# Patient Record
Sex: Male | Born: 1945 | Race: White | Hispanic: No | Smoking: Former smoker
Health system: Southern US, Community
[De-identification: ages and names within clinical notes are randomized; demographics above are authoritative.]

## PROBLEM LIST (undated history)

## (undated) DIAGNOSIS — M659 Unspecified synovitis and tenosynovitis, unspecified site: Secondary | ICD-10-CM

## (undated) DIAGNOSIS — I499 Cardiac arrhythmia, unspecified: Secondary | ICD-10-CM

## (undated) DIAGNOSIS — R0602 Shortness of breath: Secondary | ICD-10-CM

## (undated) DIAGNOSIS — R42 Dizziness and giddiness: Secondary | ICD-10-CM

## (undated) DIAGNOSIS — D649 Anemia, unspecified: Secondary | ICD-10-CM

## (undated) DIAGNOSIS — G4730 Sleep apnea, unspecified: Secondary | ICD-10-CM

## (undated) DIAGNOSIS — M542 Cervicalgia: Secondary | ICD-10-CM

## (undated) DIAGNOSIS — I10 Essential (primary) hypertension: Secondary | ICD-10-CM

## (undated) DIAGNOSIS — N182 Chronic kidney disease, stage 2 (mild): Secondary | ICD-10-CM

## (undated) DIAGNOSIS — K253 Acute gastric ulcer without hemorrhage or perforation: Secondary | ICD-10-CM

## (undated) DIAGNOSIS — M1990 Unspecified osteoarthritis, unspecified site: Secondary | ICD-10-CM

## (undated) DIAGNOSIS — R51 Headache: Secondary | ICD-10-CM

## (undated) HISTORY — DX: Chronic kidney disease, stage 2 (mild): N18.2

## (undated) HISTORY — DX: Anemia, unspecified: D64.9

## (undated) HISTORY — DX: Unspecified synovitis and tenosynovitis, unspecified site: M65.90

## (undated) HISTORY — DX: Synovitis and tenosynovitis, unspecified: M65.9

## (undated) HISTORY — PX: CARDIAC CATHETERIZATION: SHX172

## (undated) HISTORY — PX: OTHER SURGICAL HISTORY: SHX169

## (undated) HISTORY — PX: COLONOSCOPY: SHX174

---

## 1990-02-27 HISTORY — PX: OTHER SURGICAL HISTORY: SHX169

## 1997-05-28 ENCOUNTER — Ambulatory Visit (HOSPITAL_COMMUNITY): Admission: RE | Admit: 1997-05-28 | Discharge: 1997-05-28 | Payer: Self-pay | Admitting: Cardiology

## 1998-10-02 ENCOUNTER — Ambulatory Visit: Admission: RE | Admit: 1998-10-02 | Discharge: 1998-10-02 | Payer: Self-pay | Admitting: Orthopedic Surgery

## 1998-10-02 ENCOUNTER — Encounter: Payer: Self-pay | Admitting: Orthopedic Surgery

## 1999-02-28 HISTORY — PX: KNEE ARTHROSCOPY: SUR90

## 1999-12-19 ENCOUNTER — Encounter: Payer: Self-pay | Admitting: Physical Medicine and Rehabilitation

## 1999-12-19 ENCOUNTER — Encounter
Admission: RE | Admit: 1999-12-19 | Discharge: 1999-12-19 | Payer: Self-pay | Admitting: Physical Medicine and Rehabilitation

## 2001-12-23 ENCOUNTER — Encounter: Payer: Self-pay | Admitting: Family Medicine

## 2001-12-23 ENCOUNTER — Ambulatory Visit (HOSPITAL_COMMUNITY): Admission: RE | Admit: 2001-12-23 | Discharge: 2001-12-23 | Payer: Self-pay | Admitting: Family Medicine

## 2004-12-05 ENCOUNTER — Ambulatory Visit (HOSPITAL_COMMUNITY): Admission: RE | Admit: 2004-12-05 | Discharge: 2004-12-05 | Payer: Self-pay | Admitting: General Surgery

## 2005-02-21 ENCOUNTER — Ambulatory Visit (HOSPITAL_COMMUNITY): Admission: RE | Admit: 2005-02-21 | Discharge: 2005-02-21 | Payer: Self-pay | Admitting: *Deleted

## 2005-02-27 HISTORY — PX: OTHER SURGICAL HISTORY: SHX169

## 2005-04-18 ENCOUNTER — Encounter
Admission: RE | Admit: 2005-04-18 | Discharge: 2005-04-18 | Payer: Self-pay | Admitting: Thoracic Surgery (Cardiothoracic Vascular Surgery)

## 2005-04-19 ENCOUNTER — Ambulatory Visit (HOSPITAL_COMMUNITY): Admission: RE | Admit: 2005-04-19 | Discharge: 2005-04-19 | Payer: Self-pay | Admitting: Family Medicine

## 2005-05-05 ENCOUNTER — Inpatient Hospital Stay (HOSPITAL_COMMUNITY)
Admission: RE | Admit: 2005-05-05 | Discharge: 2005-05-10 | Payer: Self-pay | Admitting: Thoracic Surgery (Cardiothoracic Vascular Surgery)

## 2005-05-05 ENCOUNTER — Encounter (INDEPENDENT_AMBULATORY_CARE_PROVIDER_SITE_OTHER): Payer: Self-pay | Admitting: *Deleted

## 2005-06-19 ENCOUNTER — Encounter
Admission: RE | Admit: 2005-06-19 | Discharge: 2005-06-19 | Payer: Self-pay | Admitting: Thoracic Surgery (Cardiothoracic Vascular Surgery)

## 2005-12-18 ENCOUNTER — Encounter (INDEPENDENT_AMBULATORY_CARE_PROVIDER_SITE_OTHER): Payer: Self-pay | Admitting: Specialist

## 2005-12-18 ENCOUNTER — Other Ambulatory Visit: Admission: RE | Admit: 2005-12-18 | Discharge: 2005-12-18 | Payer: Self-pay | Admitting: Family Medicine

## 2005-12-21 ENCOUNTER — Ambulatory Visit (HOSPITAL_COMMUNITY): Admission: RE | Admit: 2005-12-21 | Discharge: 2005-12-21 | Payer: Self-pay | Admitting: Family Medicine

## 2006-10-01 ENCOUNTER — Ambulatory Visit (HOSPITAL_COMMUNITY): Admission: RE | Admit: 2006-10-01 | Discharge: 2006-10-01 | Payer: Self-pay | Admitting: Family Medicine

## 2006-10-02 ENCOUNTER — Ambulatory Visit (HOSPITAL_COMMUNITY): Admission: RE | Admit: 2006-10-02 | Discharge: 2006-10-02 | Payer: Self-pay | Admitting: Family Medicine

## 2007-02-28 HISTORY — PX: SHOULDER SURGERY: SHX246

## 2007-08-31 ENCOUNTER — Ambulatory Visit: Payer: Self-pay | Admitting: Internal Medicine

## 2007-08-31 ENCOUNTER — Inpatient Hospital Stay (HOSPITAL_COMMUNITY): Admission: EM | Admit: 2007-08-31 | Discharge: 2007-09-01 | Payer: Self-pay | Admitting: Emergency Medicine

## 2007-08-31 ENCOUNTER — Ambulatory Visit: Payer: Self-pay | Admitting: Cardiology

## 2007-08-31 ENCOUNTER — Encounter (INDEPENDENT_AMBULATORY_CARE_PROVIDER_SITE_OTHER): Payer: Self-pay | Admitting: Internal Medicine

## 2007-09-18 ENCOUNTER — Ambulatory Visit: Payer: Self-pay

## 2007-09-19 ENCOUNTER — Ambulatory Visit: Payer: Self-pay

## 2007-09-19 ENCOUNTER — Encounter: Payer: Self-pay | Admitting: Cardiology

## 2007-09-25 ENCOUNTER — Observation Stay (HOSPITAL_COMMUNITY): Admission: RE | Admit: 2007-09-25 | Discharge: 2007-09-27 | Payer: Self-pay | Admitting: Orthopedic Surgery

## 2007-11-25 ENCOUNTER — Encounter: Admission: RE | Admit: 2007-11-25 | Discharge: 2007-11-25 | Payer: Self-pay | Admitting: Orthopedic Surgery

## 2008-01-24 ENCOUNTER — Ambulatory Visit (HOSPITAL_COMMUNITY): Admission: RE | Admit: 2008-01-24 | Discharge: 2008-01-24 | Payer: Self-pay | Admitting: Family Medicine

## 2008-03-01 IMAGING — CR DG CHEST 1V PORT
1 series · 1 of 1 positions shown · non-contrast
Comparison: 05/05/05.

CLINICAL DATA: Post-op VATS.  Mediastinal mass.
 PORTABLE CHEST - 1 VIEW - 05/06/05 AT 7176 HOURS:

[view not recorded]
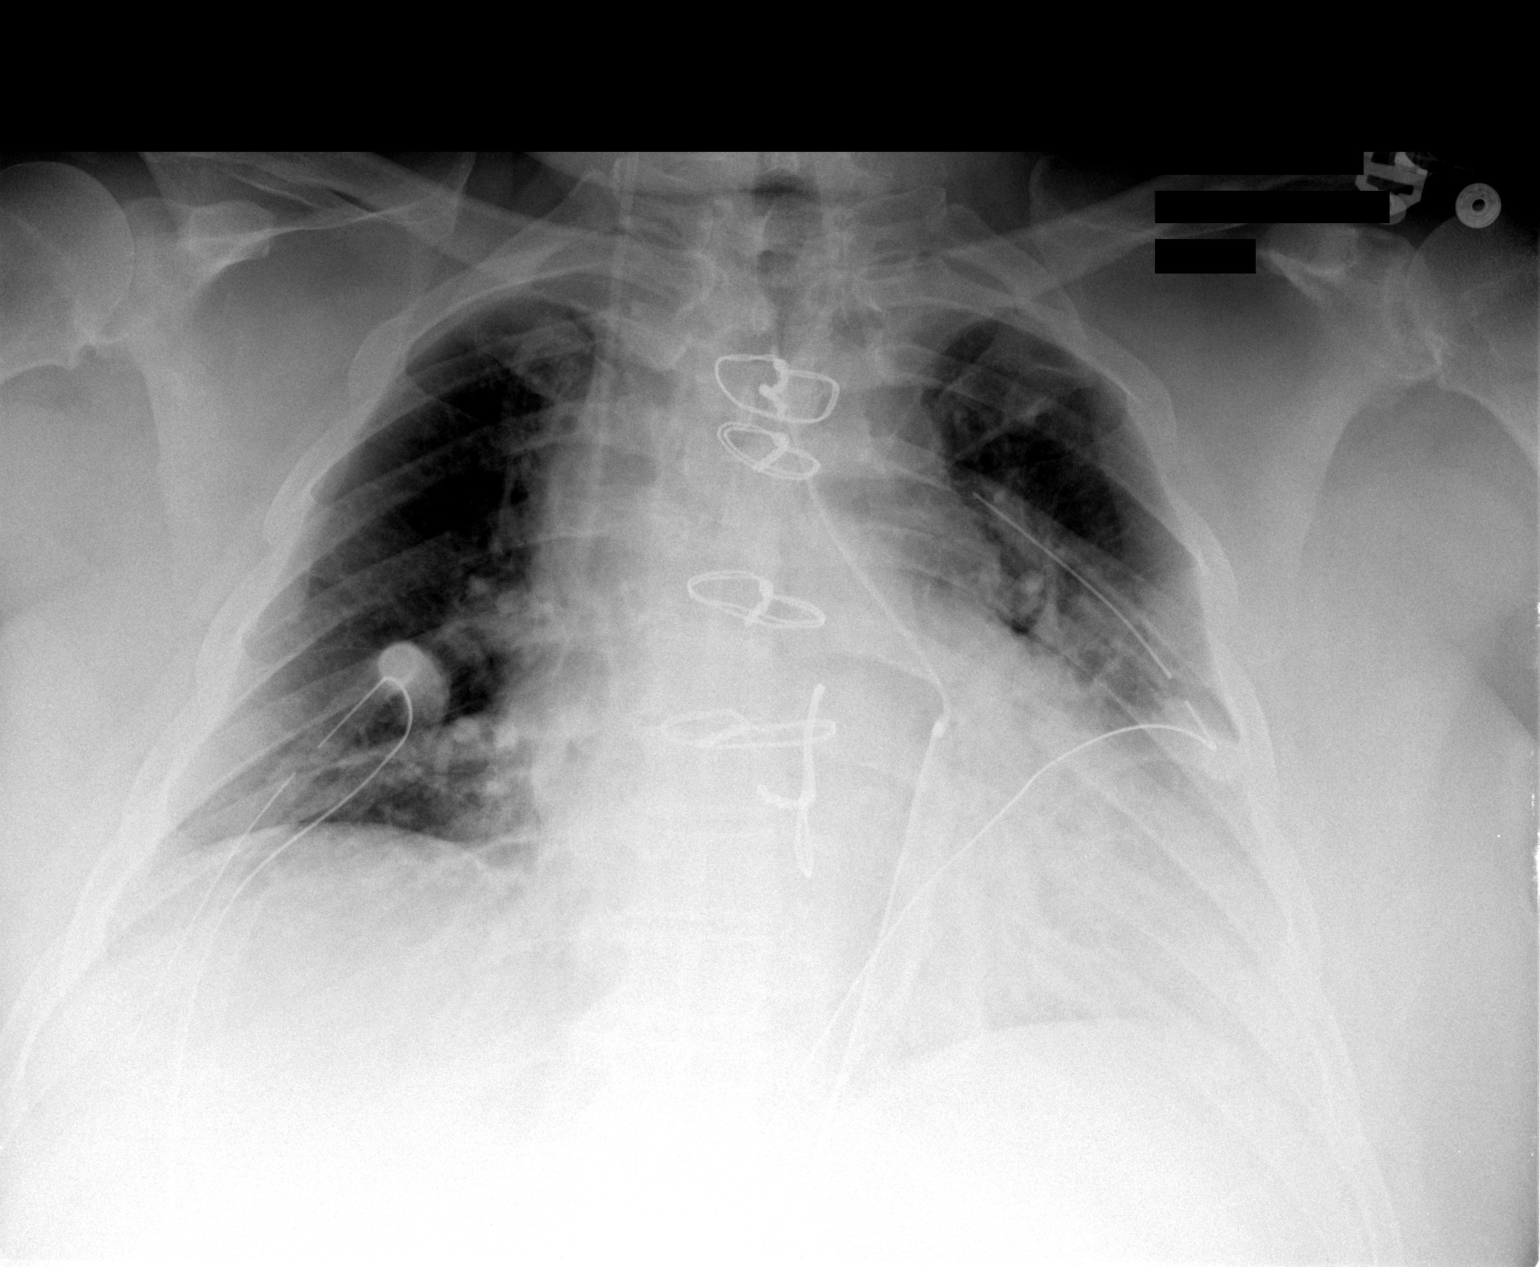

[1 of 1 positions shown; findings below may reference images not displayed]

FINDINGS: The right IJ central venous catheter and bilateral chest tubes are in stable position.  The cardiomediastinal contours are unchanged status post median sternotomy and probable CABG.  Overall pulmonary aeration has improved.  There is no pneumothorax.
IMPRESSION: Improving pulmonary aeration.  No pneumothorax.

## 2008-03-02 IMAGING — CR DG CHEST 1V PORT
1 series · 1 of 1 positions shown · non-contrast
Comparison: 05/06/05.

CLINICAL DATA: Postop VATS for mediastinal mass. 
 PORTABLE CHEST ([DATE]  HOURS):

[view not recorded]
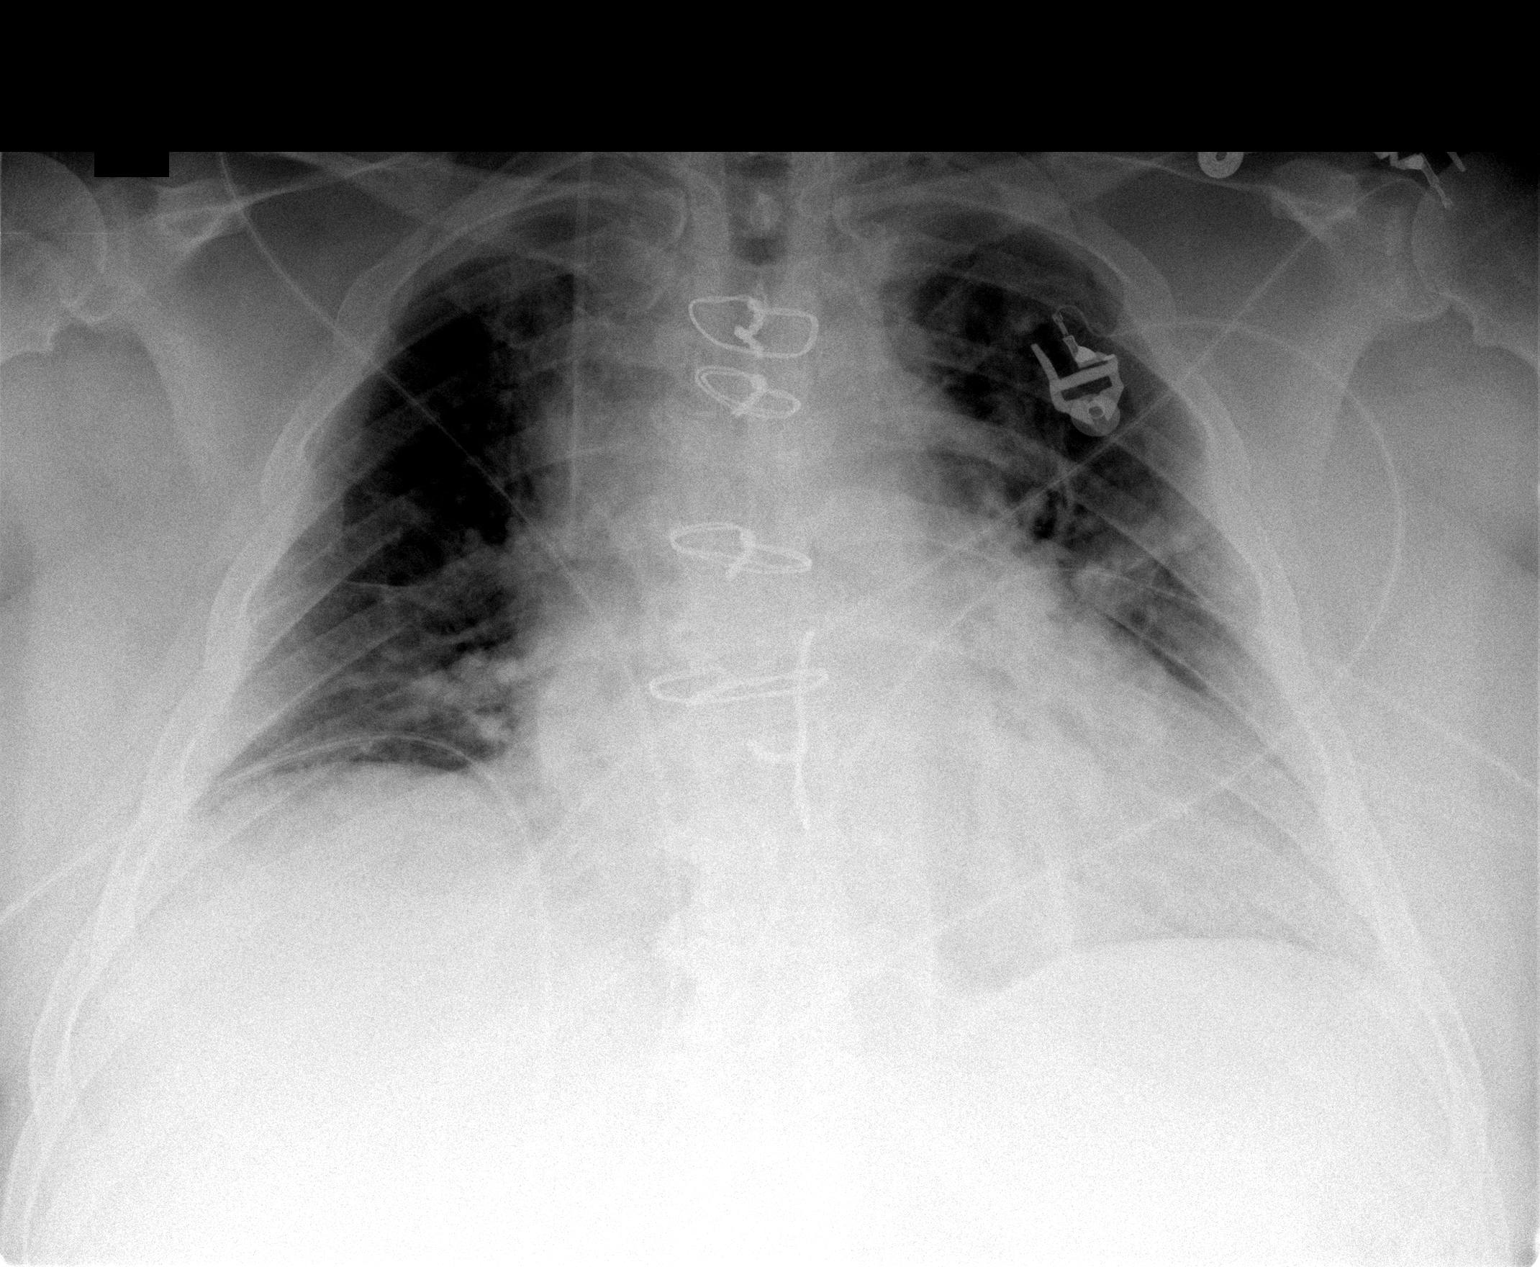

[1 of 1 positions shown; findings below may reference images not displayed]

FINDINGS: The chest tubes have been removed.  Central line remains in place.  The cardiomediastinal contours appear stable following median sternotomy.  There is mild perihilar atelectasis bilaterally.  No pneumothorax or significant pleural effusion is seen.
IMPRESSION: No pneumothorax or other significant change following chest tube removal.

## 2009-09-15 ENCOUNTER — Ambulatory Visit (HOSPITAL_COMMUNITY): Admission: RE | Admit: 2009-09-15 | Discharge: 2009-09-15 | Payer: Self-pay | Admitting: Family Medicine

## 2009-09-17 ENCOUNTER — Emergency Department (HOSPITAL_COMMUNITY): Admission: EM | Admit: 2009-09-17 | Discharge: 2009-09-17 | Payer: Self-pay | Admitting: Emergency Medicine

## 2009-09-30 ENCOUNTER — Ambulatory Visit: Payer: Self-pay | Admitting: Otolaryngology

## 2010-02-27 DIAGNOSIS — R42 Dizziness and giddiness: Secondary | ICD-10-CM

## 2010-02-27 HISTORY — DX: Dizziness and giddiness: R42

## 2010-03-21 ENCOUNTER — Encounter: Payer: Self-pay | Admitting: Family Medicine

## 2010-06-27 ENCOUNTER — Other Ambulatory Visit (HOSPITAL_COMMUNITY): Payer: Self-pay | Admitting: Family Medicine

## 2010-06-27 ENCOUNTER — Ambulatory Visit (HOSPITAL_COMMUNITY)
Admission: RE | Admit: 2010-06-27 | Discharge: 2010-06-27 | Disposition: A | Discharge status: Home / Self Care | Payer: Medicare Other | Source: Ambulatory Visit | Attending: Family Medicine | Admitting: Family Medicine

## 2010-06-27 DIAGNOSIS — I10 Essential (primary) hypertension: Secondary | ICD-10-CM

## 2010-06-27 DIAGNOSIS — R0789 Other chest pain: Secondary | ICD-10-CM | POA: Insufficient documentation

## 2010-06-27 DIAGNOSIS — M546 Pain in thoracic spine: Secondary | ICD-10-CM | POA: Insufficient documentation

## 2010-07-12 NOTE — Discharge Summary (Signed)
[name], [name]                 ACCOUNT NO.:  [id number]   MEDICAL RECORD NO.:  [id number]          PATIENT TYPE:  INP   LOCATION:  [id number]                         FACILITY:  [facility]   PHYSICIAN:  Evette Doffing, M.D.  DATE OF BIRTH:  12/12/1945   DATE OF ADMISSION:  08/31/2007  DATE OF DISCHARGE:  09/01/2007                               DISCHARGE SUMMARY   DISCHARGE DIAGNOSES:  1. Chest pain, ruled out for myocardial infarction with negative EKG      and cardiac enzymes.  2. Hypertension.  3. Obesity.  4. Obstructive sleep apnea.  5. Benign paroxysmal positional vertigo.  6. Status post benign mediastinal mass excision in 2007.  7. Umbilical hernia.  8. Gout.   DISCHARGE MEDICATIONS:  1. Aspirin 81 mg p.o. daily.  2. Meclizine 25 mg p.o. daily.  3. Benicar 40 mg p.o. daily.  4. Diltiazem 480 mg p.o. daily.  5. Tylenol 325-500 mg p.o. q. 6h. p.r.n.   DISCHARGE CONDITION AND FOLLOWUP:  At the time of discharge, the  patient's chest pain had resolved.  He still had some dizziness, which  was reproducible with changing his position.  He was prescribed  meclizine for this.  Given the patient's creatinine was approximately  1.2 during this admission, he was advised not to continue taking Indocin  for his gout.  He can address this with his primary care physician, Dr.  Hilma Favors.  The patient has appointments to see Dr. Hilma Favors on September 23, 2007 at 12 noon.  He also has appointment with [facility] to  have an echocardiogram and a Myoview.   We suggest that the patient might benefit from a sleep study to better  titrate his mask to help with his obstructive sleep apnea.   PROCEDURES:  On August 31, 2007, the patient received a CT angiography of  the chest, which showed no evidence for acute pulmonary embolus.  It did  show cardiomegaly and mild pulmonary edema.   CONSULTATIONS:  Dr. Kirk Ruths of Cardiology was consulted.   BRIEF ADMITTING HISTORY AND PHYSICAL:  The  patient is a 65 year old male  with hypertension, obstructive sleep apnea who presented with dizziness  and diaphoresis that began at 4 a.m., the day of admission and was  associated with dull and throbbing chest pain, high in the chest,  radiating through its back.  He thought he was dying and was worried.  He went to the bathroom, and had a normal bowel movement without symptom  relief.  He went back to sleep, but the chest pain persisted.  He had an  episode of emesis at 8 a.m. due to dizziness.  The dizziness was made  worse with any movement.  His chest pain resolved shortly before he was  given 4 baby aspirin, and sublingual nitroglycerin.  He had been having  chest pain, like the one that brought him to the ED and these occur  without relation to activity.  The pain generally lasts about 2 hours.   PHYSICAL EXAMINATION:  VITAL SIGNS:  Temperature 97.8, blood pressure  ranging from 154  to 180/72 to 88, pulse 66, and oxygen saturation 96% on  room air.  GENERAL:  He was in no acute distress.  HEENT:  Pupils were equal, round, and reactive to light.  The patient  has an exotropia.  NECK:  There was no JVD.  LUNGS:  Clear to auscultation bilaterally.  HEART:  Regular rate and rhythm.  No murmurs, rubs, or gallops.  ABDOMEN:  Obese and nontender.  Bowel sounds were present.  He had 1+  edema to the mid shin bilaterally.  The rest of his exam was  unremarkable.   LABS:  Sodium 140, potassium 4.4, chloride 106, bicarbonate 27, BUN 20,  creatinine 1.1, glucose 107, and calcium 9.5.  White blood count 11.8,  hemoglobin 14, and platelets 300.  Urine drug screen was negative.  One  set of cardiac markers was negative.   IMAGING:  EKG showed normal sinus rhythm with no ST changes.  Chest x-  rays showed no acute disease.   HOSPITAL COURSE:  1. Chest pain.  The patient's chest pain subsided, and he was ruled      out for myocardial infarction with negative EKGs and negative       cardiac enzymes.  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  As the      patient's chest pain improved, dizziness emerged as a prominent      symptom, and it was determined that he was also suffering from      benign paroxysmal positional vertigo as described below.  He was      discharged on his home antihypertensive medication to follow up      with his primary care physician.  Dr. Kirk Ruths of Cardiology      was consulted, and after the patient had ruled out for acute      myocardial infarction with negative EKGs and cardiac enzymes, Dr.      Stanford Breed recommended an outpatient echocardiogram as well as      Myoview stress test.  These tests were scheduled for the patient,      and he was notified of the appointment.  2. Benign paroxysmal positional vertigo.  The patient was determined      to have dizziness that was exacerbated with moving his heads.  He      was therefore diagnosed with benign paroxysmal positional vertigo,      and given a prescription for meclizine to help with it.  He is to      follow up with his primary care physician for this problem.   DISCHARGE LABORATORIES AND VITAL:  At the time of discharge, the  patient's vital signs were temperature 97.8, pulse 60, respirations 18,  blood pressure 135/70, and oxygen saturation 95% on room air.  His labs  were; white blood count 9.8, hemoglobin 12.7, and platelets 265.  Sodium  137, potassium 4.2, chloride 102, bicarbonate 29,  BUN 15, creatinine 1.22, glucose 91, and calcium 8.8.  Fasting lipid  panel showed a total cholesterol of 140, triglycerides 301, HDL 27, LDL  53, and VLDL 60.  Pending labs.  There were no laboratory results  pending at the time of this dictation.  Lajean Saver, MD   Electronically Signed      Evette Doffing, M.D.  Electronically Signed    PN/MEDQ  D:  09/16/2007  T:  09/17/2007  Job:  [id number]   cc:   Dr. Gae Bon. Stanford Breed, MD, [facility]

## 2010-07-12 NOTE — Op Note (Signed)
NAMEKADER, DELONE                 ACCOUNT NO.:  [id number]   MEDICAL RECORD NO.:  [id number]          PATIENT TYPE:  AMB   LOCATION:  DAY                          FACILITY:  [facility]   PHYSICIAN:  Ronald A. Gioffre, M.D.DATE OF BIRTH:  [birth year]   DATE OF PROCEDURE:  09/25/2007  DATE OF DISCHARGE:                               OPERATIVE REPORT   SURGEON:  [name]. [name], M.D.   OPERATIONS:  Lorretta Harp PA   PREOPERATIVE DIAGNOSES:  1. Full-thickness tear of distal rotator cuff tendon right shoulder.  2. Severe impingement syndrome right shoulder.   POSTOPERATIVE DIAGNOSES:  1. Full-thickness tear of distal rotator cuff tendon right shoulder.  2. Severe impingement syndrome right shoulder.   OPERATION:  1. Open acromionectomy, acromioplasty right shoulder.  2. Repair of a complete tear of the rotator cuff tendon, right      shoulder utilizing a tissue mend graft and one  PEEK Stryker      anchor.   PROCEDURE:  Under general anesthesia, routine orthopedic prep and  draping of the right shoulder was carried out.  He had 2 grams IV Ancef  preop.  At this time an incision was made over the anterior aspect of  the right shoulder.  Bleeders identified and cauterized.  Following  that, I then separated deltoid tendons from the acromion by sharp  dissection.  I split the proximal part of the deltoid muscle.  I then  went down and excised the subdeltoid bursa.  Note he had absolutely no  room in the subacromial space.  I had to exert some traction on the arm  and then inserted a Bennett retractor up under the acromion.  I utilized  the oscillating saw and did a partial acromionectomy.  I then utilized  the bur to even out the undersurface of the acromion.  We now  reestablished the subacromial space.  I thoroughly irrigated out the  area.  Bleeders identified and cauterized.  At this time some Gelfoam  was placed up into the subacromial space.  I then used a 2 x 2 cm tissue  mend  graft, sutured it down in place to repair the rotator cuff tendon.  The remaining part of the tendon graft was repaired with the peak anchor  that had 4  separate sutures.  I thoroughly irrigated out the area,  reapproximated deltoid tendon and muscle in the usual fashion with #1  Tycron.  The subcu was closed with 0 Vicryl.  The skin was closed with  metal staples.  A sterile Neosporin dressing was applied and he was  placed in a shoulder immobilizer.           ______________________________  [name] [name], M.D.     RAG/MEDQ  D:  09/25/2007  T:  09/25/2007  Job:  [id number]

## 2010-07-12 NOTE — Consult Note (Signed)
NAMEJAEON, [name]                 ACCOUNT NO.:  [id number]   MEDICAL RECORD NO.:  [id number]          PATIENT TYPE:  INP   LOCATION:  1825                         FACILITY:  [facility]   PHYSICIAN:  Denice Bors. Stanford Breed, MD, FACCDATE OF BIRTH:  02-16-1946   DATE OF CONSULTATION:  DATE OF DISCHARGE:                                 CONSULTATION   The patient is a 65 year old male with a past medical history of  hypertension, status post thymus resection, obstructive sleep apnea, and  gout who was evaluated for chest pain.  The patient apparently has had  intermittent chest pain for approximately 4 years.  It is in the  substernal area and does not radiate.  It is not pleuritic or positional  nor is it related to food.  It can occur either at rest or with  exertion.  There is no associated nausea, vomiting, diaphoresis, or  shortness of breath typically.  It can last 2 hours or longer.  Note, he  has had 2 previous catheterizations.  The most recent catheterization  was performed on March 01, 2005.  I do not have the exact report  available, but based on prior previous H&P, it showed normal coronary  arteries and normal LV function.  It was performed due to falsely  positive Myoview.  He also had an echocardiogram performed on December 21, 2005, and interpreted by Dr. Mathis Bud.  There was concentric left  ventricular hypertrophy with basal septal hypertrophy.  This was felt  possibly causing dynamic left ventricular outflow tract gradient.  There  was right ventricle enlargement and there was mild-to-moderate  pericardial effusion.  This morning, the patient awoke with chest pain  at 4:30 in the morning.  It was similar to his previous episodes, but it  was associated with dizziness and nausea.  He therefore presented to the  emergency room.  Note, the chest pain lasted from 4:30 in the morning  till 12:30 this afternoon.  Because of the above, we were asked to  further evaluate.   PAST  MEDICAL HISTORY:  Significant for hypertension, but there is no  diabetes mellitus or hyperlipidemia.  He has a history of thymus  resection.  He also has a history of gout.  He has a history of  obstructive sleep apnea.  He has had a umbilical hernia.  He also has  degenerative joint disease.  He has had previous knee arthroscopy and a  cyst removed from his back.   ALLERGIES:  He has an allergy to ADVIL.   FAMILY HISTORY:  Positive for coronary artery disease in his brother and  his father.   SOCIAL HISTORY:  He has remote history of tobacco use, but he has not  smoked in 25 years.  He rarely consumes alcohol.   REVIEW OF SYSTEMS:  He denies any headaches, fevers, or chills.  There  is no productive cough or hemoptysis.  There is no dysphagia,  odynophagia, melena, or hematochezia.  There is no dysuria or hematuria.  There are no rashes or seizure activity.  There is no orthopnea.  Occasional pedal edema and PND.  He also has some arthralgias.  Remaining systems are negative.   PHYSICAL EXAMINATION:  VITAL SIGNS:  Today, shows a pulse of 63.  Blood  pressure is 154/ 70 and he is afebrile.  GENERAL:  He is well developed and somewhat obese.  He is in no acute  distress at present.  SKIN:  Warm and dry.  He does not appear to be depressed.  BACK:  Normal other than a previous removal of a cyst.  HEENT:  Normal with normal eyelids.  NECK:  Supple with normal upstroke bilaterally.  No bruits is noted.  There is no jugular distention and I cannot appreciate thyromegaly.  CHEST:  Clear to auscultation.  No expansion.  CARDIOVASCULAR:  There is regular rhythm.  Normal S1 and S2.  There is a  2/6 systolic murmur at the left sternal border.  There is no S3 or S4.  ABDOMEN:  Nontender and nondistended.  Positive bowel sounds.  No bowel  splenomegaly.  No mass appreciated.  There is no abdominal bruit.  He  does have an umbilical hernia.  His femoral pulses are difficult to  palpate due  to his obesity.  I cannot appreciate bruits.  EXTREMITIES:  Showed trace edema.  There are no cords palpated.  He has  2+ posterior tibial pulses bilaterally.  NEUROLOGIC:  Grossly intact.   LABORATORY DATA:  His electrocardiogram shows a sinus rhythm at a rate  of 63.  A prior septal infarct cannot be excluded.  There is no RV  conduction delay.  There are no ST changes noted.   DIAGNOSES:  1. Atypical chest pain - The patient's chest pain is atypical in that      it has been occurring for 4 years and it can occur either at rest      or with exertion.  It lasted for approximately 8 hours today.      Note, he has had previous catheterization x2 with most recent in      January 2007 that showed normal coronaries.  We would recommend      continuing his cycle enzymes.  If they are negative, then we will      plan an outpatient echocardiogram to follow up on his left      ventricular outflow tract gradient and also to follow up on his      effusion.  We would also recommend an outpatient Myoview.  We would      recommend continuing with his present angiotensin receptor blocker      and calcium blocker and adding an aspirin to his medical regimen.      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  We would therefore recommend screening with      a D-dimer.  2. Hypertension - We will track his blood pressure and adjust his      medicines as indicated.  3. History of gout - Management per the primary care service.  4. History of thymus resection.  5. Obstructive sleep apnea.  We will be happy to follow while he is in      the hospital.      Denice Bors. Stanford Breed, MD, [facility]  Electronically Signed     BSC/MEDQ  D:  08/31/2007  T:  09/01/2007  Job:  [id number]

## 2010-07-15 NOTE — Discharge Summary (Signed)
[name], [name]                 ACCOUNT NO.:  [id number]   MEDICAL RECORD NO.:  [id number]          PATIENT TYPE:  INP   LOCATION:  2025                         FACILITY:  [facility]   PHYSICIAN:  Valentina Gu. Roxy Manns, M.D. DATE OF BIRTH:  1945/10/22   DATE OF ADMISSION:  05/05/2005  DATE OF DISCHARGE:  05/10/2005                                 DISCHARGE SUMMARY   ADMISSION DIAGNOSIS:  Mediastinal mass with history of hemoptysis.   PAST MEDICAL HISTORY/DISCHARGE DIAGNOSES:  1.  Obstructive sleep apnea with continuous positive airway pressure use.  2.  Hypertension.  3.  Umbilical hernia.  4.  Gout.  5.  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  6.  Status post left knee arthroscopy.  7.  Status post disk removal from his back.  8.  Status post recent colonoscopy by Dr. Arnoldo Morale, which the patient reports      was okay.  9.  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   ALLERGIES:  ADVIL, which causes welts.   BRIEF HISTORY:  The patient is a 65 year old obese Caucasian male with a  history of three to four episodes of hemoptysis over a two-year span.  The  patient initially did not seek medical attention; however, on November 29, 2004, a chest CT was done that revealed a 2 x 2 x 3.6 cm anterior  mediastinal mass adjacent to the aortic arch and just superior to the main  pulmonary artery.  An aneurysm could not be completely ruled out; therefore,  the patient was referred to Gilford Raid, M.D., of the CVTS service.  Dr.  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  However, secondary to personal issues  the patient wished to postpone surgery.  He rescheduled a follow-up on  April 17, 2005, with Dr. Darylene Price, as Dr. Roxy Manns had performed his  mother's cardiac  surgery years prior.  Dr. Roxy Manns also recommended surgical  resection for diagnosis but recommended a follow-up CT scan to assess for  changes.  This was carried out on April 18, 2005, and showed a mass  measuring 2 x 5 x 3.8 cm but was otherwise without significant change.  The  patient was cleared for surgery from a pulmonary and cardiac standpoint, and  therefore his surgery was scheduled.   HOSPITAL COURSE:  The patient was admitted and taken to the OR on May 05, 2005, for a thymectomy secondary to anterior mediastinal mass.  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  The patient tolerated  the procedure well and was hemodynamically stable immediately  postoperatively.  The patient was transferred from the OR to the SICU in  stable condition.  The patient was extubated without complication and woke  up from anesthesia neurologically intact.   The patient's postoperative course has progressed as expected.  All invasive  tubes and lines were discontinued  in a routine manner without difficulty.  The patient was allowed to use his home CPAP machine throughout his hospital  stay.  He has been ambulating well and is ambulating independently at this  time with the aid of a walker.   On postoperative day #2, the patient's only complaint was of dizziness.  Orthostatic were checked, and these were found to be normal and therefore  the patient was given a normal saline bolus and his symptoms subsequently  resolved.  On postoperative day #4, the patient states that he feels much  better.  He is afebrile with stable vital signs.  His chest x-ray reveals  only bibasilar atelectasis.  On physical examination, cardiac is regular  rate and rhythm, the lungs are clear to auscultation, the abdomen is benign,  and his bowel function has returned.  The incision is clean, dry, and  intact.  The patient is in stable condition at this time and as long as he  continues to progress in  the current manner, he will be ready for discharge  in the next one to two days pending morning round reevaluation.   LABORATORY DATA:  CBC on May 09, 2005:  White count 15.7, hemoglobin 12.8,  hematocrit 38.8, platelets 318.  BMP on May 08, 2005:  Sodium 138,  potassium 3.7, BUN 10, creatinine 1.1, glucose 144.  T3 on May 09, 2005,  2.2, free T4 on May 09, 2005, 1.17.  hemoglobin A1c on May 09, 2005,  5.9.  TSH on May 06, 2005, 0.307.   CONDITION ON DISCHARGE:  Improved.   INSTRUCTIONS:   MEDICATIONS:  1.  Aspirin 81 mg daily.  2.  Indomethacin 75 mg one to two tablets p.r.n.  3.  Benicar 40 mg daily.  4.  Sular 40 mg daily.  5.  Tylox one to two q.4-6h. p.r.n. pain.   ACTIVITY:  No driving or lifting more than 10 pounds x3 weeks, and the  patient should continue daily breathing and walking exercises.   DIET:  Low salt, low fat.   WOUND CARE:  The patient may shower daily and clean the incisions with soap  and water.  If wound problems arise, the patient should contact the CVTS  office.   FOLLOW-UP APPOINTMENT:  1.  With Dr. Roxy Manns on May 19, 2005, at 12 p.m.  The patient should present      to [place] one hour      prior to this at 11 a.m. for a PA and lateral chest x-ray.  2.  With Dr. Hilma Favors, his primary care physician.  The patient will be      instructed to contact his office for a follow-up appointment one to two      weeks after discharge for repeat thyroid lab study.      Leta Baptist, PA      Valentina Gu. Roxy Manns, M.D.  Electronically Signed    AY/MEDQ  D:  05/09/2005  T:  05/10/2005  Job:  [id number]   cc:   Halford Chessman, M.D.  Fax: [phone]

## 2010-07-15 NOTE — H&P (Signed)
[name], [name]                 ACCOUNT NO.:  [id number]   MEDICAL RECORD NO.:  [id number]          PATIENT TYPE:  INP   LOCATION:  NA                           FACILITY:  [facility]   PHYSICIAN:  Valentina Gu. Roxy Manns, M.D. DATE OF BIRTH:  Oct 20, 1945   DATE OF ADMISSION:  05/05/2005  DATE OF DISCHARGE:                                HISTORY & PHYSICAL   PRIMARY PHYSICIAN:  Halford Chessman, M.D.   CARDIOLOGIST:  Quay Burow, M.D.   CHIEF COMPLAINT:  Mediastinal mass with history of hemoptysis.   HISTORY OF PRESENT ILLNESS:  [name] is a 65 year old Caucasian male with  a history of 3-4 episodes of hemoptysis over a 2-year span, for which he  attributed it to sinus drainage.  He did not seek medical attention at that  time, but promised his wife to have a physical exam just before she died.  A  chest CT scan was done on November 29, 2004 showing a 2 x 2 x 3.6 cm anterior  mediastinal mass adjacent to the aortic arch and just superior to the main  pulmonary artery.  The radiologist could not completely rule out aneurysm,  either.  He was initially referred to cardiothoracic surgeon, Dr. 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  However, due to his wife's recent passing, and the approaching  holidays, [name] wished to postpone surgery.  He rescheduled follow up on  April 17, 2005 with Dr. Darylene Price, as Dr. Roxy Manns had performed his  brother's cardiac surgery years prior.  Dr. Roxy Manns also recommended surgical  resection for diagnosis, but recommended a follow up CT scan to assess for  changes.  This was done on April 18, 2005, and now shows a mass measuring  2 x 5 x 3.8 cm, but was otherwise without significant change.  Pulmonary  function tests were also done on April 19, 2005 showing no significant  obstructive disease and increased diffusion capacity.   Apparently, he had  also undergone cardiac catheterization by Dr. Gwenlyn Found on March 01, 2005, as  it was found he had falsely positive Cardiolite.  His cardiac  catheterization showed no significant coronary artery disease.  He was  subsequently cleared and scheduled for partial upper sternotomy for a  resection of his mediastinal mass.  He has had no further hemoptysis since  September of 2006.  He also denies recent cough or fever.  He is morbidly  obese and has been diagnosed with obstructive sleep apnea and uses a CPAP on  occasion.  He has mild shortness of breath and does complain of dyspnea on  exertion, particularly associated with when he is more fatigued.  He denies  PND or orthopnea.  He has had no unexplained weight changes.  There is no  history of pneumonia, pulmonary embolism or DVT.  He does report occasional  pains in his calves with walking, but has had no ulcerations.  He also has  chronic peripheral edema,  particularly in his feet and ankles.  He does get  occasional reflux, but has noticed considerable improvement since he quit  smoking approximately 25 years ago.  He also has an occasional palpitation,  and said that years ago one doctor said he had some irregularity in his  heart rhythm, but is unsure of the specifics.  He does get occasional chest  pressures, which he describes in the upper mid sternum.  He also has an  occasional burning sensation after meals which occasionally radiates to his  back.   PAST MEDICAL HISTORY:  1.  Anterior mediastinal mass as described above.  2.  Obstructive sleep apnea and use CPAP, but not regularly due to a feeling      of claustrophobia.  3.  Hypertension.  4.  Umbilical hernia, which has been evaluated, but was told that he needed      to lose 100 pounds before surgery could be considered.  5.  Gout.  6.  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   PAST SURGICAL HISTORY:  1.  Left knee arthroscopy.  2.  A cyst  removed from his back.  3.  Recent colonoscopy by Dr. Arnoldo Morale, which he reports was okay.  4.  Cardiac catheterization by Dr. Quay Burow on March 01, 2005 showing      normal coronary arteries and normal left ventricular function.   ALLERGIES:  He has developed welts with taking ADVIL.   MEDICATIONS:  1.  Aspirin 81 mg p.o. daily.  2.  Indomethacin time release 75 mg 1-2 tablets daily as needed.  3.  Benicar 40 mg daily.  4.  He was recently taking metoprolol 50 mg p.o. b.i.d., but discontinued      secondary to dizziness.  He has recently been changed to a new beta      blocker, but has yet to take this, and does not remember the name.   REVIEW OF SYSTEMS:  See history of present illness for pertinent positives  and negatives.  He also reports some sinus drainage and that he fatigues  easily.  He denies any hematochezia.   SOCIAL HISTORY:  He is recently widowed and lives with his son who is 46  years old.  He quit smoking about 25 years ago, and denies any significant  alcohol use, reporting that he drinks only about 6 beers per year.  He works  as a Media planner.  He lives in [place].   FAMILY HISTORY:  His mother is living at age 28 and has diabetes mellitus  and has suffered from a stroke.  His father died at age 5 from a myocardial  infarction, and also had problems with emphysema.  He has a brother who is  around 52 who has had heart disease and is status post CABG and AICD.   PHYSICAL EXAMINATION:  VITAL SIGNS:  Blood pressure 164/84 in the right arm,  heart rate 80, respirations 22.  GENERAL APPEARANCE:  This is a 65 year old Caucasian male who is obese.  He  is alert, cooperative, pleasant and in no acute distress.  HEENT:  His head is normocephalic and atraumatic.  Pupils equal, round and  reactive to light and accommodation.  His sclerae is nonicteric.  He does  wear glasses.  His oral mucosa is pink and moist.  He has poor dentition, but no evidence  of abscess was noted.  NECK:  His neck is supple.  There is no lymphadenopathy or thyromegaly  noted.  He has palpable carotid pulses with no bruits auscultated.  RESPIRATORY:  Lung sounds are clear, unlabored and symmetric on inspiration.  CARDIAC:  His heart has a regular rate and rhythm.  No murmur, rub, or  gallop was noted.  ABDOMEN:  Soft, nontender, nondistended.  It is obese.  He has normoactive  bowel sounds.  I was unable to appreciate any hepatosplenomegaly.  He does  have a non-tender umbilical hernia.  GU/RECTAL:  These exams were deferred.  EXTREMITIES:  His extremities are warm.  He has 2+ radial pulses and  dorsalis pedis pulses bilaterally, and 1+ posterior tibial pulses  bilaterally.  He does show evidence of venous stasis changes in his  bilateral calves.  There is no ulceration.  His ankles and feet have about  1+ non-pitting edema.  NEUROLOGIC:  His neurologic exam is grossly intact.  He is alert and  oriented x4.  His speech is clear.  His gait is steady.  His muscle strength  is 5/5 in his upper and lower extremities and grossly symmetrical.   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   PLAN:  He will be electively admitted to [facility] on May 05, 2005 to undergo partial upper sternotomy for thymectomy and resection of  mediastinal mass by Dr. Darylene Price.  Dr. Roxy Manns has seen and examined Mr.  Leard prior to this admission and explained the risks and benefits of the  above procedure, and he has agreed to proceed.      Jacinta Shoe, P.A.      Valentina Gu. Roxy Manns, M.D.  Electronically Signed    AWZ/MEDQ  D:  05/03/2005  T:  05/03/2005  Job:  [id number]

## 2010-07-15 NOTE — Procedures (Signed)
NAMEJAYVONTE, [name]                 ACCOUNT NO.:  [id number]   MEDICAL RECORD NO.:  [id number]          PATIENT TYPE:  OUT   LOCATION:  RAD                           FACILITY:  [facility]   PHYSICIAN:  Leslye Peer, MD       DATE OF BIRTH:  Feb 16, 1946   DATE OF PROCEDURE:  12/21/2005  DATE OF DISCHARGE:                                  ECHOCARDIOGRAM   INDICATIONS:  A 65 year old gentleman with a past medical history of  hypertension and palpitations who is referred for murmur and syncope.   The technical part of the study is quite limited secondary to patient body  habitus and poor acoustic windows.   The aorta measures the upper limits of normal at 3.9 cm.   The left atrium appears to be enlarged but I am unable to quantify this.   The interventricular septum and posterior wall are both thickened, however,  there is also basal septal hypertrophy overlay.  Although this was not  interrogated for dynamic left ventricular outflow track obstruction.  There  does appear to be turbulence in this area.   The aortic valve appears to be trileaflet with reasonable leaflet excursion  and indeed the leaflets appear reasonably thin and pliable.  However, there  is a peak velocity of 1.8 meters per second corresponding to a peak gradient  of 13 mmHg gradient and a mean gradient of 10 mmHg..  Area by Doppler was  2.2 cm2.  This is more suggestive of left for outflow track obstruction as  the aortic valve leaflets themselves appears thin and delicate with normal  leaflet opening.  Having said that, it was not pristinely visualized so a  TEE should be considered if clinically indicated.  Mitral valve appears  grossly structurally normal.  Mild mitral regurgitation is noted.  Doppler  interrogation of mitral the valve was within normal limits.   Pulmonic valve was not well visualized.   Tricuspid valve was also poorly visualized but mild tricuspid regurgitation  was noted.   The left ventricle  measured normally with the LV IDD at 4.4 cm and LV ISD  measured 3.2 cm.  Overall left ventricular systolic function appeared normal  and no regional wall motion abnormalities were noted.   The right atrium was not well visualized, while the right ventricle appeared  mild to moderately dilated with preserved right ventricular systolic  function.  There is a small to moderate-sized posterior pericardial effusion  and a small anterior pericardial effusion (circumferentially) without  obvious evidence of hemodynamic compromise.  There is no right atrial  inversion nor is there right ventricular collapse.   IMPRESSION:  1. Aorta measured at the upper limits of normal.  2. Left atrium is enlarged but unable to quantify.  3. There does appear to be concentric LVH, however, there is also basal      septal hypertrophy overlay which does appear to be causing some      turbulence and likely represents some element of dynamic left      ventricular outflow tract obstruction, although this was not  interrogated by Doppler.  4. 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  Consider TEE if      clinically indicated.  5. Mild mitral and tricuspid regurgitation.  6. Normal left size and systolic function without obvious regional wall      motion abnormality, although the endocardium was not well visualized.  7. Mild to moderate right ventricular enlargement with preserved right      ventricular systolic function.  8. Circumferential pericardial effusion which appears to be mild to      moderate posteriorly and small anteriorly without evidence of      hemodynamic compromise.            ______________________________  Leslye Peer, MD     AB/MEDQ  D:  12/21/2005  T:  12/22/2005  Job:  [id number]   cc:   Halford Chessman, M.D.  Fax: [phone]   Leslye Peer, MD  Fax: [phone]

## 2010-07-15 NOTE — H&P (Signed)
[name], [name]                 ACCOUNT NO.:  [id number]   MEDICAL RECORD NO.:  [id number]           PATIENT TYPE:  AMB   LOCATION:                                FACILITY:  [facility]   PHYSICIAN:  Jamesetta So, M.D.  DATE OF BIRTH:  [birth year]   DATE OF ADMISSION:  DATE OF DISCHARGE:  LH                                HISTORY & PHYSICAL   CHIEF COMPLAINT:  Need for screening colonoscopy.   HISTORY OF PRESENT ILLNESS:  The patient is a 65 year old white male who is  referred for endoscopic evaluation.  He needs a colonoscopy for screening  purposes.  No abdominal pain, weight loss, nausea, vomiting, diarrhea,  constipation, melena, hematochezia have been noted.  He has never had a  colonoscopy.  There is no family history of colon carcinoma.   PAST MEDICAL HISTORY:  Hypertension.   PAST SURGICAL HISTORY:  1.  Cyst removal from back.  2.  Knee surgery.   CURRENT MEDICATIONS:  A blood pressure pill.   ALLERGIES:  ADVIL.   REVIEW OF SYSTEMS:  Noncontributory.   PHYSICAL EXAMINATION:  GENERAL:  The patient is a well-developed, well-  nourished, white male in no acute distress.  LUNGS:  Clear to auscultation with equal breath sounds bilaterally.  HEART:  Reveals a regular rate and rhythm without S3, S4, or murmurs.  ABDOMEN:  Soft, nontender, nondistended.  No hepatosplenomegaly or masses  are noted.  RECTAL:  Deferred till the procedure.   IMPRESSION:  Need for screening colonoscopy.   PLAN:  The patient is scheduled for a colonoscopy, on December 05, 2004.  The  risks and benefits of the procedure including bleeding and perforation were  fully explained to the patient who gave informed.      Jamesetta So, M.D.  Electronically Signed     MAJ/MEDQ  D:  11/29/2004  T:  11/29/2004  Job:  [id number]   cc:   Jamesetta So, M.D.  Fax: [facility]  Fax: [phone]   Halford Chessman, M.D.  Fax: [phone]

## 2010-07-15 NOTE — Op Note (Signed)
NAMEZIMIR, [name]                 ACCOUNT NO.:  [id number]   MEDICAL RECORD NO.:  [id number]          PATIENT TYPE:  INP   LOCATION:  2302                         FACILITY:  [facility]   PHYSICIAN:  Valentina Gu. Roxy Manns, M.D. DATE OF BIRTH:  1945/12/19   DATE OF PROCEDURE:  05/05/2005  DATE OF DISCHARGE:                                 OPERATIVE REPORT   PREOPERATIVE DIAGNOSIS:  Anterior mediastinal mass.   POSTOPERATIVE DIAGNOSIS:  Substernal goiter.   PROCEDURE:  Partial upper sternotomy for thymectomy with resection of  mediastinal mass.   SURGEON:  Valentina Gu. Roxy Manns, M.D.   ASSISTANT:  Gilford Raid, M.D.   ANESTHESIA:  General.   BRIEF CLINICAL NOTE:  The patient is a 65 year old morbidly, obese, white  male who originally presented with a transient episode of, hemoptysis that  prompted a CT scan of the chest that was performed in October2006. Although  no clear etiology for hemoptysis was identified, the patient was found to  have a small anterior mediastinal mass with benign characteristics that were  most suggestive of a possible thymoma. The patient was initially seen in  consultation by Dr. Gilford Raid. For a variety of reasons the patient did  not want to proceed with surgery, at that time; and he returned at a later  date seeking elective surgical resection for definitive diagnosis and  treatment. The patient has been counseled, at length, regarding the  indications, risks, and potential benefits of surgery. Alternative treatment  strategies have been discussed. Alternative surgical options have been  reviewed. He understands and accepts all associated risks of surgery and  desires to proceed as described.   OPERATIVE NOTE IN DETAIL:  The patient is brought to the operating room on  the above-mentioned date, and a central venous catheter is placed for  intravenous access. A radial arterial line is placed. Intravenous  antibiotics are administered. General endotracheal  anesthesia is induced  uneventfully, under the care and direction of Dr. Oren Bracket. The  patient's anterior chest and abdomen are prepared and draped in sterile  manner.   A small upper sternotomy incision is performed beginning at the sternal  notch and extending down approximately 8 cm. The incision is completed down  to the level of the sternum with electrocautery. The sternum is divided  vertically at the level of the fourth intercostal space, at which time, an L-  incision in the sternum is created to the left fourth intercostal space. The  sternum is not divided on the right side. Through this partial upper  sternotomy. The remainder of the surgical procedures performed.   Once a retractor is placed, dissection is initially begun on the left side  superiorly. All anterior mediastinal fat and subjacent tissues retracted  inferiorly and tissue was dissected away from the underside of the left side  of the sternum, until the left pleural space is reached. Superiorly  dissection is continued until the innominate vein is identified; and all  subjacent tissue is swept inferiorly away from the innominate vein.  Inferiorly the dissection is continued until the anterior surface of  the  pericardium is encountered. One can easily appreciate a palpable rubbery  firm mass located to the left side of midline within the thymic fat and  tissue. Dissection is now continued laterally on the left side, until the  left phrenic nerve is identified. All of the mediastinal fat, residual  thymic tissue, and the tumor, itself, are also swept towards the right side;  and dissection is now continued back developing the plane along the anterior  surface of the pericardium. After this is completed, dissection is continued  across the midline along the underside of the surface; and then, again, on  the superior side until the left pleural space is identified.   The left pleura is swept away and plane  is developed between the left  pleural and the mediastinal tissues until the entire tissue block of all  anterior mediastinal fat, thymic tissue, and the mass in question are all  resected as a single specimen. The specimen is sent to pathology for  preliminary frozen section, evaluation of the mass, with potential for  evaluation of surgical margins if the mass is felt to be malignant.   The anterior mediastinum is irrigated with saline solution and inspected for  meticulous surgical hemostasis. The left and right pleural spaces are each  drained using 28-French chest tubes, and the anterior mediastinum is drained  using 28-French Bard drain, each of which are exited through separate stab  incisions laterally. The On-Q continuous pain management system is utilized  for postoperative pain control. Two 10-inch Silastic catheters supplied with  the On-Q kit are tunneled into the deep subcutaneous tissues, beginning at  the costal margin inferiorly on either side of sternum; and positioned just  lateral to the lateral border of the sternum on either side. Each of these  catheters are subsequently flushed with 5 mL of 0.5% bupivacaine solution  and ultimately they are connected to a continuous infusion pump. The sternum  is reapproximated using double-strength sternal wire. The soft tissues,  anterior sternum, are closed in multiple layers; and the skin is closed with  a running subcuticular skin closure.   The patient tolerated the procedure well, is extubated in the operating  room, and transported to the recovery room in stable condition. There are no  intraoperative complications. Instrument count being correct at the time of  surgery, but a chest x-ray was performed in the operating room prior to  transport, to make sure that no instruments were left in the chest.  Preliminary frozen section pathology results called back from the pathology department were felt to be consistent with a  substernal goiter as the mass  itself was identified as thyroid tissue with goiter formation.      Valentina Gu. Roxy Manns, M.D.  Electronically Signed     CHO/MEDQ  D:  05/05/2005  T:  05/06/2005  Job:  [id number]   cc:   Halford Chessman, M.D.  Fax: [phone]   Quay Burow, M.D.  Fax: [phone]

## 2010-11-24 LAB — DIFFERENTIAL
Basophils Relative: 2 — ABNORMAL HIGH
Eosinophils Absolute: 0.3
Eosinophils Relative: 3
Lymphs Abs: 3.3
Monocytes Absolute: 0.5
Monocytes Absolute: 0.6
Monocytes Relative: 4

## 2010-11-24 LAB — CBC
HCT: 38.6 — ABNORMAL LOW
Hemoglobin: 12.7 — ABNORMAL LOW
MCV: 83.5
Platelets: 265
Platelets: 288
RBC: 5.1
RDW: 15
RDW: 15.2
WBC: 11.8 — ABNORMAL HIGH
WBC: 9.8

## 2010-11-24 LAB — LIPID PANEL
HDL: 27 — ABNORMAL LOW
Total CHOL/HDL Ratio: 5.2
Triglycerides: 301 — ABNORMAL HIGH
VLDL: 60 — ABNORMAL HIGH

## 2010-11-24 LAB — COMPREHENSIVE METABOLIC PANEL
AST: 17
Alkaline Phosphatase: 34 — ABNORMAL LOW
BUN: 20
CO2: 27
Calcium: 9.5
Chloride: 106
GFR calc non Af Amer: >60
Glucose, Bld: 107 — ABNORMAL HIGH
Sodium: 140
Total Bilirubin: 0.6

## 2010-11-24 LAB — URINALYSIS, ROUTINE W REFLEX MICROSCOPIC
Bilirubin Urine: NEGATIVE
Glucose, UA: NEGATIVE
Ketones, ur: NEGATIVE
pH: 5

## 2010-11-24 LAB — HEMOGLOBIN A1C
Hgb A1c MFr Bld: 5.9
Mean Plasma Glucose: 133
Mean Plasma Glucose: 136

## 2010-11-24 LAB — D-DIMER, QUANTITATIVE: D-Dimer, Quant: 1.16 — ABNORMAL HIGH

## 2010-11-24 LAB — BASIC METABOLIC PANEL
BUN: 15
Chloride: 102
Glucose, Bld: 91
Potassium: 4.2
Sodium: 137

## 2010-11-24 LAB — POCT I-STAT, CHEM 8
BUN: 23
Chloride: 105
Glucose, Bld: 105 — ABNORMAL HIGH
Hemoglobin: 15.3
Potassium: 4.5
Sodium: 140

## 2010-11-24 LAB — CARDIAC PANEL(CRET KIN+CKTOT+MB+TROPI)
Relative Index: INVALID
Troponin I: <0.01
Troponin I: <0.01

## 2010-11-24 LAB — TROPONIN I: Troponin I: <0.01

## 2010-11-24 LAB — RAPID URINE DRUG SCREEN, HOSP PERFORMED
Benzodiazepines: NOT DETECTED
Cocaine: NOT DETECTED
Tetrahydrocannabinol: NOT DETECTED

## 2010-11-24 LAB — POCT CARDIAC MARKERS
CKMB, poc: <1 — ABNORMAL LOW
Myoglobin, poc: 42.6
Operator id: 257131
Troponin i, poc: <0.05

## 2010-11-24 LAB — LIPASE, BLOOD: Lipase: 29

## 2010-11-24 LAB — TSH: TSH: 0.716 (ref 0.350–4.500)

## 2010-11-25 LAB — URINALYSIS, ROUTINE W REFLEX MICROSCOPIC
Glucose, UA: NEGATIVE
Ketones, ur: NEGATIVE
Nitrite: NEGATIVE
Protein, ur: NEGATIVE
pH: 5

## 2010-11-25 LAB — CBC
HCT: 42.7
MCHC: 32.7
MCV: 83.1
Platelets: 302
RDW: 15.2

## 2010-11-25 LAB — DIFFERENTIAL
Basophils Absolute: 0.1
Eosinophils Relative: 2
Lymphocytes Relative: 30
Lymphs Abs: 3.3
Monocytes Absolute: 0.8
Neutro Abs: 6.6

## 2010-11-25 LAB — COMPREHENSIVE METABOLIC PANEL
AST: 16
Albumin: 3.7
BUN: 12
Calcium: 8.9
Creatinine, Ser: 1.14
GFR calc Af Amer: >60
Total Bilirubin: 0.3

## 2010-11-25 LAB — TYPE AND SCREEN: ABO/RH(D): O POS

## 2010-11-25 LAB — PROTIME-INR: Prothrombin Time: 12.4

## 2010-11-25 LAB — APTT: aPTT: 29

## 2011-06-19 ENCOUNTER — Other Ambulatory Visit: Payer: Self-pay | Admitting: Urology

## 2011-07-03 ENCOUNTER — Encounter (HOSPITAL_BASED_OUTPATIENT_CLINIC_OR_DEPARTMENT_OTHER): Payer: Self-pay | Admitting: *Deleted

## 2011-07-03 NOTE — Progress Notes (Signed)
Pt instructed  Npo p mn.  To [facility] 5/7 @ 0615.  Needs istat ekg on arrival.

## 2011-07-03 NOTE — H&P (Signed)
ctive Problems Problems  1. Balanitis 607.1 2. Phimosis 605  History of Present Illness  [name] is a 66 yo WM sent in consultation by Dr. Hilma Favors for phimosis and consideration of a circumcision.  The patient first noticed this about 1 month ago.  He has discomfort trying to retract the skin and is unable to retract it.  It feels like there is a cut on it.  He has no itching or burning on the penis but he does in the groins.  He is voiding without complaints but his stream is weak..   Past Medical History Problems  1. History of  Adult Sleep Apnea 780.57 2. History of  Arthritis V13.4 3. History of  Atrial Fibrillation 427.31 4. History of  Esophageal Reflux 530.81 5. History of  Gout 274.9 6. History of  Hypertension 401.9 7. History of  Rhythm Disorder 427.9  Surgical History Problems  1. History of  Excision Of Thymus Total 2. History of  Knee Arthroscopy Left  Current Meds 1. Aleve TABS; Therapy: (Recorded:22Apr2013) to 2. Aspirin 81 MG Oral Tablet; Therapy: (Recorded:22Apr2013) to 3. Benicar 40 MG Oral Tablet; Therapy: (Recorded:22Apr2013) to 4. Indocin 25 MG CAPS; Therapy: (Recorded:22Apr2013) to  Allergies Medication  1. Advil CAPS 2. Amoxicillin TABS  Family History Problems  1. Paternal history of  Blood In Urine 2. Family history of  Congestive Heart Failure 3. Family history of  Death In The Family Father 4. Family history of  Death In The Family Mother 34. Family history of  Diabetes Mellitus V18.0 6. Family history of  Hypertension V17.49 7. Paternal history of  Nephrolithiasis  Social History Problems    Caffeine Use   Currently On Disability   Former Smoker V15.82   Marital History - Widowed Denied    History of  Alcohol Use  Review of Systems Genitourinary, constitutional, skin, eye, otolaryngeal, hematologic/lymphatic, cardiovascular, pulmonary, endocrine, musculoskeletal, gastrointestinal, neurological and psychiatric system(s) were  reviewed and pertinent findings if present are noted.  Genitourinary: nocturia.  Cardiovascular: leg swelling.  Musculoskeletal: joint pain (in the knees).    Vitals Vital Signs [Data Includes: Last 1 Day]  22Apr2013 12:34PM  BMI Calculated: 45.92 BSA Calculated: 2.49 Height: 5 ft 9 in Weight: 310 lb  Blood Pressure: 161 / 92 Temperature: 97.3 F Heart Rate: 79  Physical Exam Constitutional: Well nourished and well developed . No acute distress.  ENT:. The ears and nose are normal in appearance.  Neck: The appearance of the neck is normal and no neck mass is present.  Pulmonary: No respiratory distress and normal respiratory rhythm and effort.  Cardiovascular: Heart rate and rhythm are normal . No peripheral edema.  Abdomen: The abdomen is obese. The abdomen is soft and nontender. No masses are palpated. No CVA tenderness.  An umbilical hernia is present.  No inguinal hernia is present on the right.  No inguinal hernia is present on the left. No hepatosplenomegaly noted.  Genitourinary: Examination of the penis demonstrates phimosis (that is severe), but no discharge, no masses, no lesions and a normal meatus. The penis is uncircumcised. The scrotum is without lesions. The right epididymis is palpably normal and non-tender. The left epididymis is palpably normal and non-tender. The right testis is non-tender and without masses. The left testis is non-tender and without masses.  Lymphatics: The femoral and inguinal nodes are not enlarged or tender.  Skin: Normal skin turgor, no visible rash and no visible skin lesions.  Neuro/Psych:. Mood and affect are appropriate.  Results/Data Urine [Data Includes: Last 1 Day]   22Apr2013  COLOR YELLOW   APPEARANCE CLEAR   SPECIFIC GRAVITY 1.020   pH 5.5   GLUCOSE NEG mg/dL  BILIRUBIN NEG   KETONE NEG mg/dL  BLOOD TRACE   PROTEIN TRACE mg/dL  UROBILINOGEN 0.2 mg/dL  NITRITE NEG   LEUKOCYTE ESTERASE NEG   SQUAMOUS EPITHELIAL/HPF RARE     WBC NONE SEEN WBC/hpf  RBC 0-3 RBC/hpf  BACTERIA NONE SEEN   CRYSTALS NONE SEEN   CASTS NONE SEEN    Old records or history reviewed: I have reviewed records from Dr. Hilma Favors including office notes and some labs.    Assessment Assessed  1. Phimosis 605 2. Balanitis 607.1   He has marked phimosis with some balanitis.   Plan Balanitis (607.1)  1. Nystatin-Triamcinolone 100000-0.1 UNIT/GM-% External Cream; APPLY SPARINGLY TO  AFFECTED AREA(S) 3 TIMES A DAY; Therapy: 22Apr2013 to (Evaluate:18Dec2013); Last  Rx:22Apr2013 Health Maintenance (V70.0)  2. UA With REFLEX  Done: 22Apr2013 12:14PM Phimosis (605)  3. Follow-up Schedule Surgery Office  Follow-up  Requested for: 22Apr2013   I am going to send mycolog cream. He needs a circumcision and I reviewed the risks of bleeding, infection, penile scarring, penile pain, a buried penis, hair on the penile shaft, change in sexual sensation, thrombotic events and anesthetic complications.

## 2011-07-04 ENCOUNTER — Encounter (HOSPITAL_BASED_OUTPATIENT_CLINIC_OR_DEPARTMENT_OTHER): Payer: Self-pay | Admitting: Anesthesiology

## 2011-07-04 ENCOUNTER — Encounter (HOSPITAL_BASED_OUTPATIENT_CLINIC_OR_DEPARTMENT_OTHER)
Admission: RE | Disposition: A | Discharge status: Home / Self Care | Payer: Self-pay | Source: Ambulatory Visit | Attending: Urology

## 2011-07-04 ENCOUNTER — Encounter (HOSPITAL_BASED_OUTPATIENT_CLINIC_OR_DEPARTMENT_OTHER): Payer: Self-pay | Admitting: *Deleted

## 2011-07-04 ENCOUNTER — Ambulatory Visit (HOSPITAL_BASED_OUTPATIENT_CLINIC_OR_DEPARTMENT_OTHER)
Admission: RE | Admit: 2011-07-04 | Discharge: 2011-07-04 | Disposition: A | Discharge status: Home / Self Care | Payer: Medicare Other | Source: Ambulatory Visit | Attending: Urology | Admitting: Urology

## 2011-07-04 ENCOUNTER — Ambulatory Visit (HOSPITAL_BASED_OUTPATIENT_CLINIC_OR_DEPARTMENT_OTHER): Payer: Medicare Other | Admitting: Anesthesiology

## 2011-07-04 DIAGNOSIS — Z79899 Other long term (current) drug therapy: Secondary | ICD-10-CM | POA: Insufficient documentation

## 2011-07-04 DIAGNOSIS — N476 Balanoposthitis: Secondary | ICD-10-CM | POA: Insufficient documentation

## 2011-07-04 DIAGNOSIS — G4730 Sleep apnea, unspecified: Secondary | ICD-10-CM | POA: Insufficient documentation

## 2011-07-04 DIAGNOSIS — K219 Gastro-esophageal reflux disease without esophagitis: Secondary | ICD-10-CM | POA: Insufficient documentation

## 2011-07-04 DIAGNOSIS — I10 Essential (primary) hypertension: Secondary | ICD-10-CM | POA: Insufficient documentation

## 2011-07-04 DIAGNOSIS — I4891 Unspecified atrial fibrillation: Secondary | ICD-10-CM | POA: Insufficient documentation

## 2011-07-04 DIAGNOSIS — N471 Phimosis: Secondary | ICD-10-CM | POA: Insufficient documentation

## 2011-07-04 DIAGNOSIS — Z7982 Long term (current) use of aspirin: Secondary | ICD-10-CM | POA: Insufficient documentation

## 2011-07-04 HISTORY — DX: Cervicalgia: M54.2

## 2011-07-04 HISTORY — DX: Cardiac arrhythmia, unspecified: I49.9

## 2011-07-04 HISTORY — DX: Headache: R51

## 2011-07-04 HISTORY — DX: Sleep apnea, unspecified: G47.30

## 2011-07-04 HISTORY — DX: Dizziness and giddiness: R42

## 2011-07-04 HISTORY — PX: CIRCUMCISION: SHX1350

## 2011-07-04 HISTORY — DX: Unspecified osteoarthritis, unspecified site: M19.90

## 2011-07-04 HISTORY — DX: Essential (primary) hypertension: I10

## 2011-07-04 LAB — POCT I-STAT 4, (NA,K, GLUC, HGB,HCT): Potassium: 4.1 mEq/L (ref 3.5–5.1)

## 2011-07-04 SURGERY — CIRCUMCISION, ADULT
Anesthesia: General | Site: Penis | Wound class: Clean Contaminated

## 2011-07-04 MED ORDER — SODIUM CHLORIDE 0.9 % IJ SOLN: 3.0000 mL | INTRAMUSCULAR | Status: DC | PRN

## 2011-07-04 MED ORDER — ACETAMINOPHEN 325 MG PO TABS: 650.0000 mg | ORAL_TABLET | ORAL | Status: DC | PRN

## 2011-07-04 MED ORDER — 0.9 % SODIUM CHLORIDE (POUR BTL) OPTIME
TOPICAL | Status: DC | PRN
  Administered 2011-07-04: 500 mL

## 2011-07-04 MED ORDER — SODIUM CHLORIDE 0.9 % IJ SOLN: 3.0000 mL | Freq: Two times a day (BID) | INTRAMUSCULAR | Status: DC

## 2011-07-04 MED ORDER — BUPIVACAINE HCL (PF) 0.25 % IJ SOLN
INTRAMUSCULAR | Status: DC | PRN
  Administered 2011-07-04: 7 mL

## 2011-07-04 MED ORDER — OXYCODONE HCL 5 MG PO TABS: 5.0000 mg | ORAL_TABLET | ORAL | Status: DC | PRN

## 2011-07-04 MED ORDER — LACTATED RINGERS IV SOLN
INTRAVENOUS | Status: DC
  Administered 2011-07-04: 07:00:00 via INTRAVENOUS

## 2011-07-04 MED ORDER — DEXAMETHASONE SODIUM PHOSPHATE 4 MG/ML IJ SOLN
INTRAMUSCULAR | Status: DC | PRN
  Administered 2011-07-04: 8 mg via INTRAVENOUS

## 2011-07-04 MED ORDER — ACETAMINOPHEN 650 MG RE SUPP: 650.0000 mg | RECTAL | Status: DC | PRN

## 2011-07-04 MED ORDER — MIDAZOLAM HCL 5 MG/5ML IJ SOLN
INTRAMUSCULAR | Status: DC | PRN
  Administered 2011-07-04: 2 mg via INTRAVENOUS

## 2011-07-04 MED ORDER — SODIUM CHLORIDE 0.9 % IV SOLN: 250.0000 mL | INTRAVENOUS | Status: DC | PRN

## 2011-07-04 MED ORDER — PROPOFOL 10 MG/ML IV EMUL
INTRAVENOUS | Status: DC | PRN
  Administered 2011-07-04: 250 mg via INTRAVENOUS

## 2011-07-04 MED ORDER — FENTANYL CITRATE 0.05 MG/ML IJ SOLN
INTRAMUSCULAR | Status: DC | PRN
  Administered 2011-07-04 (×3): 50 ug via INTRAVENOUS

## 2011-07-04 MED ORDER — LIDOCAINE HCL (CARDIAC) 20 MG/ML IV SOLN
INTRAVENOUS | Status: DC | PRN
  Administered 2011-07-04: 100 mg via INTRAVENOUS

## 2011-07-04 MED ORDER — FENTANYL CITRATE 0.05 MG/ML IJ SOLN: 25.0000 ug | INTRAMUSCULAR | Status: DC | PRN

## 2011-07-04 MED ORDER — ONDANSETRON HCL 4 MG/2ML IJ SOLN: 4.0000 mg | Freq: Four times a day (QID) | INTRAMUSCULAR | Status: DC | PRN

## 2011-07-04 MED ORDER — ONDANSETRON HCL 4 MG/2ML IJ SOLN
INTRAMUSCULAR | Status: DC | PRN
  Administered 2011-07-04: 4 mg via INTRAVENOUS

## 2011-07-04 MED ORDER — HYDROCODONE-ACETAMINOPHEN 5-325 MG PO TABS: 1.0000 | ORAL_TABLET | Freq: Four times a day (QID) | ORAL | Status: AC | PRN

## 2011-07-04 SURGICAL SUPPLY — 33 items
BANDAGE CO FLEX L/F 2IN X 5YD (GAUZE/BANDAGES/DRESSINGS) ×2 IMPLANT
BANDAGE CONFORM 2X5YD N/S (GAUZE/BANDAGES/DRESSINGS) IMPLANT
BLADE SURG 15 STRL LF DISP TIS (BLADE) ×1 IMPLANT
BLADE SURG 15 STRL SS (BLADE) ×2
CLEANER CAUTERY TIP 5X5 PAD (MISCELLANEOUS) ×1 IMPLANT
CLOTH BEACON ORANGE TIMEOUT ST (SAFETY) ×2 IMPLANT
COVER MAYO STAND STRL (DRAPES) ×2 IMPLANT
COVER TABLE BACK 60X90 (DRAPES) ×2 IMPLANT
DRAPE PED LAPAROTOMY (DRAPES) ×2 IMPLANT
ELECT NDL TIP 2.8 STRL (NEEDLE) IMPLANT
ELECT NEEDLE TIP 2.8 STRL (NEEDLE) IMPLANT
ELECT REM PT RETURN 9FT ADLT (ELECTROSURGICAL) ×2
ELECTRODE REM PT RTRN 9FT ADLT (ELECTROSURGICAL) ×1 IMPLANT
GAUZE XEROFORM 1X8 LF (GAUZE/BANDAGES/DRESSINGS) ×2 IMPLANT
GLOVE BIOGEL PI IND STRL 6.5 (GLOVE) IMPLANT
GLOVE BIOGEL PI IND STRL 7.0 (GLOVE) IMPLANT
GLOVE BIOGEL PI INDICATOR 6.5 (GLOVE) ×1
GLOVE BIOGEL PI INDICATOR 7.0 (GLOVE) ×1
GLOVE SURG SS PI 8.0 STRL IVOR (GLOVE) ×2 IMPLANT
GOWN PREVENTION PLUS LG XLONG (DISPOSABLE) ×2 IMPLANT
GOWN STRL REIN XL XLG (GOWN DISPOSABLE) ×2 IMPLANT
NDL HYPO 25X1 1.5 SAFETY (NEEDLE) ×1 IMPLANT
NEEDLE HYPO 25X1 1.5 SAFETY (NEEDLE) ×2 IMPLANT
NS IRRIG 500ML POUR BTL (IV SOLUTION) ×1 IMPLANT
PACK BASIN DAY SURGERY FS (CUSTOM PROCEDURE TRAY) ×2 IMPLANT
PAD CLEANER CAUTERY TIP 5X5 (MISCELLANEOUS)
PENCIL BUTTON HOLSTER BLD 10FT (ELECTRODE) ×2 IMPLANT
SUT CHROMIC 4 0 PS 2 18 (SUTURE) ×4 IMPLANT
SYR CONTROL 10ML LL (SYRINGE) ×2 IMPLANT
TOWEL NATURAL 6PK STERILE (DISPOSABLE) ×2 IMPLANT
TOWEL OR 17X24 6PK STRL BLUE (TOWEL DISPOSABLE) ×4 IMPLANT
TRAY DSU PREP LF (CUSTOM PROCEDURE TRAY) ×2 IMPLANT
WATER STERILE IRR 500ML POUR (IV SOLUTION) IMPLANT

## 2011-07-04 NOTE — Anesthesia Procedure Notes (Signed)
Procedure Name: LMA Insertion Date/Time: 07/04/2011 7:42 AM Performed by: Mechele Claude Pre-anesthesia Checklist: Patient identified, Emergency Drugs available, Suction available and Patient being monitored Patient Re-evaluated:Patient Re-evaluated prior to inductionOxygen Delivery Method: Circle System Utilized Preoxygenation: Pre-oxygenation with 100% oxygen Intubation Type: IV induction Ventilation: Mask ventilation without difficulty LMA: LMA with gastric port inserted LMA Size: 4.0 Number of attempts: 1 Placement Confirmation: positive ETCO2 Tube secured with: Tape Dental Injury: Teeth and Oropharynx as per pre-operative assessment

## 2011-07-04 NOTE — Anesthesia Postprocedure Evaluation (Signed)
  Anesthesia Post-op Note  Patient: [name]  Procedure(s) Performed: Procedure(s) (LRB): CIRCUMCISION ADULT (N/A)  Patient Location: PACU  Anesthesia Type: General  Level of Consciousness: oriented and sedated  Airway and Oxygen Therapy: Patient Spontanous Breathing  Post-op Pain: mild  Post-op Assessment: Post-op Vital signs reviewed, Patient's Cardiovascular Status Stable, Respiratory Function Stable and Patent Airway  Post-op Vital Signs: stable  Complications: No apparent anesthesia complications

## 2011-07-04 NOTE — Interval H&P Note (Signed)
History and Physical Interval Note:  07/04/2011 7:18 AM  [name]  has presented today for surgery, with the diagnosis of PHIMOSIS  The various methods of treatment have been discussed with the patient and family. After consideration of risks, benefits and other options for treatment, the patient has consented to  Procedure(s) (LRB): CIRCUMCISION ADULT (N/A) as a surgical intervention .  The patients' history has been reviewed, patient examined, no change in status, stable for surgery.  I have reviewed the patients' chart and labs.  Questions were answered to the patient's satisfaction.     [name]

## 2011-07-04 NOTE — Anesthesia Preprocedure Evaluation (Signed)
Anesthesia Evaluation  Patient identified by MRN, date of birth, ID band Patient awake    Reviewed: Allergy & Precautions, H&P , NPO status , Patient's Chart, lab work & pertinent test results, reviewed documented beta blocker date and time   Airway Mallampati: II TM Distance: >3 FB Neck ROM: Full    Dental  (+) Teeth Intact and Dental Advisory Given   Pulmonary sleep apnea and Continuous Positive Airway Pressure Ventilation ,  Non-compliant w/ CPAP breath sounds clear to auscultation  + decreased breath sounds      Cardiovascular hypertension, Pt. on medications Rhythm:Regular Rate:Normal  Hx palpitations   Neuro/Psych negative neurological ROS  negative psych ROS   GI/Hepatic negative GI ROS, Neg liver ROS,   Endo/Other  negative endocrine ROSMorbid obesitygout  Renal/GU negative Renal ROS   circumcision    Musculoskeletal negative musculoskeletal ROS (+)   Abdominal   Peds negative pediatric ROS (+)  Hematology negative hematology ROS (+)   Anesthesia Other Findings   Reproductive/Obstetrics negative OB ROS                           Anesthesia Physical Anesthesia Plan  ASA: III  Anesthesia Plan: General   Post-op Pain Management:    Induction: Intravenous  Airway Management Planned: LMA  Additional Equipment:   Intra-op Plan:   Post-operative Plan: Extubation in OR  Informed Consent: I have reviewed the patients History and Physical, chart, labs and discussed the procedure including the risks, benefits and alternatives for the proposed anesthesia with the patient or authorized representative who has indicated his/her understanding and acceptance.   Dental advisory given  Plan Discussed with: CRNA and Surgeon  Anesthesia Plan Comments:         Anesthesia Quick Evaluation

## 2011-07-04 NOTE — Discharge Instructions (Addendum)
Circumcision, Adult Care After  These instructions give you information on caring for yourself after your procedure. Your doctor may also give you more specific instructions. Call your doctor if you have any problems or questions after your procedure. HOME CARE  Only take medicine as told by your doctor.   Any bandages (dressings) should stay on for at least 2 days. Ask your doctor when to take the bandage off.   Carefully remove the bandage if it gets dirty. Apply medicated cream to the surgical cut (incision). Carefully put a new bandage on.   Do not have sex until your doctor says it is okay.   Do not get your wound wet for 2 days or as told by your doctor.   You may take a sponge bath. Clean around the surgical cut gently with mild soap and water.   You may take a shower after 2 days or as told by your doctor. Do not take a tub bath. After you shower, gently pat your surgical cut dry. Do not rub it.   Avoid heavy lifting.   Avoid contact sports, biking, or swimming until you have healed.  GET HELP RIGHT AWAY IF:   You develop a fever of 102 F (38.9 C) or higher.   You cannot pee (urinate).   You have pain when you pee.   Your pain is not helped by medicines.   There is more redness or puffiness (swelling) than expected.   There is yellowish white fluid (pus) coming from your wound.   You have bleeding that does not stop when you press on it.   You are not healing as fast as you should be.  MAKE SURE YOU:  Understand these instructions.   Will watch your condition.   Will get help right away if you are not doing well or get worse.  Document Released: 08/02/2007 Document Revised: 02/02/2011 Document Reviewed: 08/02/2007 ExitCare Patient Information 2012 [place], [facility] Post Anesthesia Home Care Instructions  Activity: Get plenty of rest for the remainder of the day. A responsible adult should stay with you for 24 hours following the procedure.  For the next 24  hours, DO NOT: -Drive a car -Paediatric nurse -Drink alcoholic beverages -Take any medication unless instructed by your physician -Make any legal decisions or sign important papers.  Meals: Start with liquid foods such as gelatin or soup. Progress to regular foods as tolerated. Avoid greasy, spicy, heavy foods. If nausea and/or vomiting occur, drink only clear liquids until the nausea and/or vomiting subsides. Call your physician if vomiting continues.  Special Instructions/Symptoms: Your throat may feel dry or sore from the anesthesia or the breathing tube placed in your throat during surgery. If this causes discomfort, gargle with warm salt water. The discomfort should disappear within 24 hours.  Marland Kitchen

## 2011-07-04 NOTE — Brief Op Note (Signed)
07/04/2011  8:09 AM  PATIENT:  [name]  66 y.o. male  PRE-OPERATIVE DIAGNOSIS:  PHIMOSIS  POST-OPERATIVE DIAGNOSIS:  PHIMOSIS  PROCEDURE:  Procedure(s) (LRB): CIRCUMCISION ADULT (N/A)  SURGEON:  Surgeon(s) and Role:    * Malka So, MD - Primary  PHYSICIAN ASSISTANT:   ASSISTANTS: none   ANESTHESIA:   general  EBL:  Total I/O In: 100 [I.V.:100] Out: -   BLOOD ADMINISTERED:none  DRAINS: none   LOCAL MEDICATIONS USED:  MARCAINE     SPECIMEN:  No Specimen  DISPOSITION OF SPECIMEN:  N/A  COUNTS:  YES  TOURNIQUET:  * No tourniquets in log *  DICTATION: .Other Dictation: Dictation Number [id number]  PLAN OF CARE: Discharge to home after PACU  PATIENT DISPOSITION:  PACU - hemodynamically stable.   Delay start of Pharmacological VTE agent (>24hrs) due to surgical blood loss or risk of bleeding: no

## 2011-07-04 NOTE — Op Note (Signed)
NAME:  [name], [name] NO.:  [id number]  MEDICAL RECORD NO.:  [id number]  LOCATION:                                 FACILITY:  PHYSICIAN:  Marshall Cork. Jeffie Pollock, M.D.    DATE OF BIRTH:  09/27/1945  DATE OF PROCEDURE: DATE OF DISCHARGE:                              OPERATIVE REPORT   PROCEDURE:  Circumcision.  PREOPERATIVE DIAGNOSIS:  Phimosis.  POSTOPERATIVE DIAGNOSIS:  Phimosis.  SURGEON:  Marshall Cork. Jeffie Pollock, M.D.  ANESTHESIA:  General and local.  COMPLICATIONS:  None.  INDICATIONS:  [name] is a 66 year old white male with phimosis who has elected circumcision.  FINDINGS OF PROCEDURE:  He was taken to the operating room where general anesthetic was induced.  He was placed in a supine position.  His genitalia was prepped with Betadine solution.  He was draped in the usual sterile fashion.  His foreskin was not retractable, so a dorsal slit was performed and additional Betadine was applied to the inner prepuce and glans.  A ventral slit was then performed and the redundant wings of foreskin were then excised.  Hemostasis was achieved with the Bovie.  The skin edges were then reapproximated using a running 4-0 chromic interrupted in the quadrants.  Once the circumcision was complete the wound was cleansed.  A penile block with 7 mL of 0.25% Marcaine was performed.  A dressing of Xeroform, 2 inch Kling and 2 inch Coban was applied.  The specimen was discarded.  The patient's anesthetic was reversed.  He was moved to the recovery room in stable condition.  There were no complications.     Marshall Cork. Jeffie Pollock, M.D.     JJW/MEDQ  D:  07/04/2011  T:  07/04/2011  Job:  [id number]  cc:   Halford Chessman, M.D. Fax: [phone]

## 2011-07-04 NOTE — Transfer of Care (Signed)
Immediate Anesthesia Transfer of Care Note  Patient: [name]  Procedure(s) Performed: Procedure(s) (LRB): CIRCUMCISION ADULT (N/A)  Patient Location: PACU  Anesthesia Type: General  Level of Consciousness: drowsy, arouses to name, follows commands  Airway & Oxygen Therapy: Patient Spontanous Breathing and Patient connected to face mask oxygen  Post-op Assessment: Report given to PACU RN and Post -op Vital signs reviewed and stable  Post vital signs: Reviewed and stable  Complications: No apparent anesthesia complications

## 2011-07-06 ENCOUNTER — Encounter (HOSPITAL_BASED_OUTPATIENT_CLINIC_OR_DEPARTMENT_OTHER): Payer: Self-pay | Admitting: Urology

## 2011-11-30 ENCOUNTER — Other Ambulatory Visit (HOSPITAL_COMMUNITY): Payer: Self-pay | Admitting: Family Medicine

## 2011-11-30 DIAGNOSIS — N509 Disorder of male genital organs, unspecified: Secondary | ICD-10-CM

## 2011-11-30 DIAGNOSIS — Z139 Encounter for screening, unspecified: Secondary | ICD-10-CM

## 2011-11-30 DIAGNOSIS — Z0000 Encounter for general adult medical examination without abnormal findings: Secondary | ICD-10-CM

## 2011-12-04 ENCOUNTER — Ambulatory Visit (HOSPITAL_COMMUNITY)
Admission: RE | Admit: 2011-12-04 | Discharge: 2011-12-04 | Disposition: A | Discharge status: Home / Self Care | Payer: Medicare Other | Source: Ambulatory Visit | Attending: Family Medicine | Admitting: Family Medicine

## 2011-12-04 ENCOUNTER — Other Ambulatory Visit (HOSPITAL_COMMUNITY): Payer: Self-pay | Admitting: Family Medicine

## 2011-12-04 DIAGNOSIS — N509 Disorder of male genital organs, unspecified: Secondary | ICD-10-CM

## 2011-12-04 DIAGNOSIS — I714 Abdominal aortic aneurysm, without rupture, unspecified: Secondary | ICD-10-CM | POA: Insufficient documentation

## 2011-12-04 DIAGNOSIS — I10 Essential (primary) hypertension: Secondary | ICD-10-CM | POA: Insufficient documentation

## 2011-12-04 DIAGNOSIS — R9389 Abnormal findings on diagnostic imaging of other specified body structures: Secondary | ICD-10-CM | POA: Insufficient documentation

## 2011-12-04 DIAGNOSIS — Z139 Encounter for screening, unspecified: Secondary | ICD-10-CM

## 2011-12-04 DIAGNOSIS — N508 Other specified disorders of male genital organs: Secondary | ICD-10-CM | POA: Insufficient documentation

## 2011-12-04 DIAGNOSIS — Z0000 Encounter for general adult medical examination without abnormal findings: Secondary | ICD-10-CM

## 2012-02-27 ENCOUNTER — Encounter (INDEPENDENT_AMBULATORY_CARE_PROVIDER_SITE_OTHER): Payer: Self-pay | Admitting: Surgery

## 2012-02-27 ENCOUNTER — Ambulatory Visit (INDEPENDENT_AMBULATORY_CARE_PROVIDER_SITE_OTHER): Payer: Medicare Other | Admitting: Surgery

## 2012-02-27 VITALS — BP 160/90 | HR 84 | Temp 98.3°F | Resp 18 | Ht 69.0 in | Wt 318.6 lb

## 2012-02-27 DIAGNOSIS — M109 Gout, unspecified: Secondary | ICD-10-CM | POA: Insufficient documentation

## 2012-02-27 DIAGNOSIS — K429 Umbilical hernia without obstruction or gangrene: Secondary | ICD-10-CM

## 2012-02-27 NOTE — Patient Instructions (Signed)
We will schedule surgery to repair your umbilical hernia. This can be done as an outpatient. However, you will need to have someone spend the night with you after surgery because of anesthesia requirements.

## 2012-02-27 NOTE — Progress Notes (Signed)
Patient ID: [name], male   DOB: Jul 20, 1945, 66 y.o.   MRN: [id number]  Chief Complaint  Patient presents with  . New Evaluation    evaul umb hernia    HPI [name] is a 66 y.o. male.  He presents today for evaluation of a umbilical hernia. He has had for at least 20 years. It is not really bothering him but he is worried about it. It is gradually getting larger and he notes that the skin overlying it has become thin. HPI  Past Medical History  Diagnosis Date  . Hypertension   . Sleep apnea     no cpap  . Headache   . Arthritis     gouty  . Vertigo 2012    takes "med" every other day  . Sore neck     d/t recent fall in tub  . Arrhythmia     pt doesnt know type    Past Surgical History  Procedure Date  . Rt shoulder repair 2009  . Thymus gland 2007  . Back csyt removed 1992  . Left knee arthroscopy 2001  . Circumcision 07/04/2011    Procedure: CIRCUMCISION ADULT;  Surgeon: Malka So, MD;  Location: [facility];  Service: Urology;  Laterality: N/A;    Family History  Problem Relation Age of Onset  . Heart disease Mother   . Heart disease Father   . Cancer Maternal Uncle     Colon    Social History History  Substance Use Topics  . Smoking status: Former Smoker    Quit date: 07/02/1980  . Smokeless tobacco: Never Used  . Alcohol Use: No    Allergies  Allergen Reactions  . Advil (Ibuprofen) Hives  . Amoxicillin Swelling  . Morphine And Related Nausea And Vomiting    Current Outpatient Prescriptions  Medication Sig Dispense Refill  . aspirin 81 MG tablet Take 81 mg by mouth every other day.      . indomethacin (INDOCIN) 25 MG capsule Take 50 mg by mouth 2 (two) times daily with a meal.      . olmesartan (BENICAR) 40 MG tablet Take 40 mg by mouth every morning.      . busPIRone (BUSPAR) 30 MG tablet         Review of Systems Review of Systems  Constitutional: Negative for fever, chills and unexpected weight change.  HENT:  Negative for hearing loss, congestion, sore throat, trouble swallowing and voice change.   Eyes: Negative for visual disturbance.  Respiratory: Negative for cough and wheezing.   Cardiovascular: Negative for chest pain, palpitations and leg swelling.  Gastrointestinal: Negative for nausea, vomiting, abdominal pain, diarrhea, constipation, blood in stool, abdominal distention, anal bleeding and rectal pain.  Genitourinary: Negative for hematuria and difficulty urinating.  Musculoskeletal: Negative for arthralgias.  Skin: Negative for rash and wound.  Neurological: Negative for seizures, syncope, weakness and headaches.  Hematological: Negative for adenopathy. Does not bruise/bleed easily.  Psychiatric/Behavioral: Negative for confusion.    Blood pressure 160/90, pulse 84, temperature 98.3 F (36.8 C), temperature source Temporal, resp. rate 18, height 5\' 9"  (1.753 m), weight 318 lb 9.6 oz (144.516 kg).  Physical Exam Physical Exam  Vitals reviewed. Constitutional: He is oriented to person, place, and time. He appears well-developed and well-nourished. No distress.  HENT:  Head: Normocephalic and atraumatic.  Eyes: Conjunctivae normal and EOM are normal. Pupils are equal, round, and reactive to light. Left eye exhibits no discharge.  Neck:  Normal range of motion. Neck supple. No tracheal deviation present. No thyromegaly present.  Cardiovascular: Normal rate, regular rhythm, normal heart sounds and intact distal pulses.  Exam reveals no gallop and no friction rub.   No murmur heard. Pulmonary/Chest: Effort normal and breath sounds normal. No respiratory distress. He has no wheezes. He has no rales.  Abdominal: Soft. Bowel sounds are normal. He exhibits no distension and no mass. There is no tenderness. There is no rebound and no guarding.       He has a partially reducible umbilical hernia which I believe contains omentum. I think the defect was about 2.5 cm. The skin overlying it is thin  with some neovascularization. It is not tender. It is soft.  Musculoskeletal: Normal range of motion. He exhibits no edema.  Neurological: He is alert and oriented to person, place, and time.  Skin: Skin is warm and dry. No erythema.  Psychiatric: He has a normal mood and affect. His behavior is normal. Judgment and thought content normal.    Data Reviewed I have reviewed over the notes the electronic medical record  Assessment    1. Chronically incarcerated umbilical hernia containing omentum 2. Morbid obesity 3. Gout, currently asymptomatic 4. History of thymectomy    Plan    I think he is a candidate for an umbilical hernia. He does not take it with mesh. I told his high risk for recurrence given his obesity. However because of the skin becoming somewhat thin I'm concerned that he'll eventually have skin necrosis if this gets much larger. He is otherwise in stable medical condition I think he can undergo surgery. I discussed the other risks and complications as well with him and answered all questions. He would like to have surgery scheduled.       Lety Cullens J 02/27/2012, 10:42 AM

## 2012-03-11 ENCOUNTER — Encounter (HOSPITAL_COMMUNITY)
Admission: RE | Admit: 2012-03-11 | Discharge: 2012-03-11 | Disposition: A | Discharge status: Home / Self Care | Payer: Medicare Other | Source: Ambulatory Visit | Attending: Anesthesiology | Admitting: Anesthesiology

## 2012-03-11 ENCOUNTER — Encounter (HOSPITAL_COMMUNITY)
Admission: RE | Admit: 2012-03-11 | Discharge: 2012-03-11 | Disposition: A | Discharge status: Home / Self Care | Payer: Medicare Other | Source: Ambulatory Visit | Attending: Surgery | Admitting: Surgery

## 2012-03-11 ENCOUNTER — Encounter (HOSPITAL_COMMUNITY): Payer: Self-pay

## 2012-03-11 LAB — CBC
HCT: 44 % (ref 39.0–52.0)
Hemoglobin: 13.8 g/dL (ref 13.0–17.0)
MCH: 26.1 pg (ref 26.0–34.0)
MCHC: 31.4 g/dL (ref 30.0–36.0)
MCV: 83.3 fL (ref 78.0–100.0)
RBC: 5.28 MIL/uL (ref 4.22–5.81)

## 2012-03-11 LAB — BASIC METABOLIC PANEL
BUN: 14 mg/dL (ref 6–23)
CO2: 28 mEq/L (ref 19–32)
Chloride: 102 mEq/L (ref 96–112)
GFR calc non Af Amer: 61 mL/min — ABNORMAL LOW (ref >90)
Glucose, Bld: 108 mg/dL — ABNORMAL HIGH (ref 70–99)
Potassium: 4.5 mEq/L (ref 3.5–5.1)
Sodium: 142 mEq/L (ref 135–145)

## 2012-03-11 LAB — SURGICAL PCR SCREEN: MRSA, PCR: NEGATIVE

## 2012-03-11 MED ORDER — CHLORHEXIDINE GLUCONATE 4 % EX LIQD: 1.0000 "application " | Freq: Once | CUTANEOUS | Status: DC

## 2012-03-11 NOTE — Progress Notes (Signed)
Pt here for PAT.  Family MD:  Dr. Al Pimple, [address] [place], [place]. Cardiologist: denies. Had heart cath >5years.  Denies having any cardiac issues or being followed by Cardiologist.  Pt stated heart cath was done by Dr. Alvester Chou, [place]. States sleep study >5years. Has CPAP but does not use-stated PCP told him he probably doesn't have sleep apnea due to no problems after Thymus surgery. Stated CPAP settings:12, when he used.  Instructed pt to bring CPAP mask DOS and he will talk to Anesthesiologist then.  Pt stated he would do so.

## 2012-03-11 NOTE — Pre-Procedure Instructions (Signed)
[name]  03/11/2012   Your procedure is scheduled on:  Thursday, 03/14/2012@7 :30AM.  Report to [place] at 5:30 AM.  Call this number if you have problems the morning of surgery: [phone]   Remember:   Do not eat food or drink liquids after midnight.   Take these medicines the morning of surgery with A SIP OF WATER: None.   Discontinue aspirin, Coumadin, Plavix, Effient, and herbal medications    Do not wear jewelry, make-up or nail polish.  Do not wear lotions, powders, or perfumes. You may wear deodorant.  Do not shave 48 hours prior to surgery. Men may shave face and neck.  Do not bring valuables to the hospital.  Contacts, dentures or bridgework may not be worn into surgery.  Leave suitcase in the car. After surgery it may be brought to your room.  For patients admitted to the hospital, checkout time is 11:00 AM the day of discharge.   Patients discharged the day of surgery will not be allowed to drive home.  Name and phone number of your driver: Hollie Beach, sister  Special Instructions: Shower using CHG 2 nights before surgery and the night before surgery.  If you shower the day of surgery use CHG.  Use special wash - you have one bottle of CHG for all showers.  You should use approximately 1/3 of the bottle for each shower.   Please read over the following fact sheets that you were given: Pain Booklet, Coughing and Deep Breathing and Surgical Site Infection Prevention

## 2012-03-12 NOTE — Consult Note (Signed)
Anesthesiology   ECG shows SR with incomplete RBBB. No H/O CAD normal coronaries by cardiac cath 2007, normal EF by Echo 09/19/07.  Labs OK  OK to proceed with planned surgery  Roberts Gaudy, MD

## 2012-03-13 MED ORDER — VANCOMYCIN HCL 10 G IV SOLR
1500.0000 mg | INTRAVENOUS | Status: AC
  Administered 2012-03-14: 1500 mg via INTRAVENOUS
  Filled 2012-03-13: qty 1500

## 2012-03-14 ENCOUNTER — Encounter (HOSPITAL_COMMUNITY): Payer: Self-pay | Admitting: Anesthesiology

## 2012-03-14 ENCOUNTER — Ambulatory Visit (HOSPITAL_COMMUNITY): Payer: Medicare Other | Admitting: Anesthesiology

## 2012-03-14 ENCOUNTER — Encounter (HOSPITAL_COMMUNITY)
Admission: RE | Disposition: A | Discharge status: Home / Self Care | Payer: Self-pay | Source: Ambulatory Visit | Attending: Surgery

## 2012-03-14 ENCOUNTER — Encounter (HOSPITAL_COMMUNITY): Payer: Self-pay | Admitting: *Deleted

## 2012-03-14 ENCOUNTER — Ambulatory Visit (HOSPITAL_COMMUNITY)
Admission: RE | Admit: 2012-03-14 | Discharge: 2012-03-14 | Disposition: A | Discharge status: Home / Self Care | Payer: Medicare Other | Source: Ambulatory Visit | Attending: Surgery | Admitting: Surgery

## 2012-03-14 DIAGNOSIS — I499 Cardiac arrhythmia, unspecified: Secondary | ICD-10-CM | POA: Insufficient documentation

## 2012-03-14 DIAGNOSIS — K420 Umbilical hernia with obstruction, without gangrene: Secondary | ICD-10-CM | POA: Insufficient documentation

## 2012-03-14 DIAGNOSIS — G4730 Sleep apnea, unspecified: Secondary | ICD-10-CM | POA: Insufficient documentation

## 2012-03-14 DIAGNOSIS — Z885 Allergy status to narcotic agent status: Secondary | ICD-10-CM | POA: Insufficient documentation

## 2012-03-14 DIAGNOSIS — I10 Essential (primary) hypertension: Secondary | ICD-10-CM | POA: Insufficient documentation

## 2012-03-14 DIAGNOSIS — Z7982 Long term (current) use of aspirin: Secondary | ICD-10-CM | POA: Insufficient documentation

## 2012-03-14 DIAGNOSIS — Z800 Family history of malignant neoplasm of digestive organs: Secondary | ICD-10-CM | POA: Insufficient documentation

## 2012-03-14 DIAGNOSIS — F411 Generalized anxiety disorder: Secondary | ICD-10-CM | POA: Insufficient documentation

## 2012-03-14 DIAGNOSIS — Z01812 Encounter for preprocedural laboratory examination: Secondary | ICD-10-CM | POA: Insufficient documentation

## 2012-03-14 DIAGNOSIS — K429 Umbilical hernia without obstruction or gangrene: Secondary | ICD-10-CM | POA: Diagnosis present

## 2012-03-14 DIAGNOSIS — M109 Gout, unspecified: Secondary | ICD-10-CM | POA: Insufficient documentation

## 2012-03-14 DIAGNOSIS — Z880 Allergy status to penicillin: Secondary | ICD-10-CM | POA: Insufficient documentation

## 2012-03-14 DIAGNOSIS — Z8249 Family history of ischemic heart disease and other diseases of the circulatory system: Secondary | ICD-10-CM | POA: Insufficient documentation

## 2012-03-14 HISTORY — PX: UMBILICAL HERNIA REPAIR: SHX196

## 2012-03-14 HISTORY — PX: INSERTION OF MESH: SHX5868

## 2012-03-14 SURGERY — REPAIR, HERNIA, UMBILICAL, ADULT
Anesthesia: General | Site: Abdomen | Wound class: Clean

## 2012-03-14 MED ORDER — BUPIVACAINE HCL (PF) 0.25 % IJ SOLN
INTRAMUSCULAR | Status: AC
  Filled 2012-03-14: qty 30

## 2012-03-14 MED ORDER — NEOSTIGMINE METHYLSULFATE 1 MG/ML IJ SOLN
INTRAMUSCULAR | Status: DC | PRN
  Administered 2012-03-14: 4 mg via INTRAVENOUS

## 2012-03-14 MED ORDER — HYDROCODONE-ACETAMINOPHEN 5-325 MG PO TABS: 1.0000 | ORAL_TABLET | ORAL | Status: DC | PRN

## 2012-03-14 MED ORDER — GLYCOPYRROLATE 0.2 MG/ML IJ SOLN
INTRAMUSCULAR | Status: DC | PRN
  Administered 2012-03-14: 0.6 mg via INTRAVENOUS

## 2012-03-14 MED ORDER — ONDANSETRON HCL 4 MG/2ML IJ SOLN
INTRAMUSCULAR | Status: DC | PRN
  Administered 2012-03-14: 4 mg via INTRAVENOUS

## 2012-03-14 MED ORDER — LACTATED RINGERS IV SOLN
INTRAVENOUS | Status: DC | PRN
  Administered 2012-03-14: 07:00:00 via INTRAVENOUS

## 2012-03-14 MED ORDER — BUPIVACAINE HCL (PF) 0.25 % IJ SOLN
INTRAMUSCULAR | Status: DC | PRN
  Administered 2012-03-14: 20 mL

## 2012-03-14 MED ORDER — 0.9 % SODIUM CHLORIDE (POUR BTL) OPTIME
TOPICAL | Status: DC | PRN
  Administered 2012-03-14: 1000 mL

## 2012-03-14 MED ORDER — SODIUM CHLORIDE 0.9 % IV SOLN
INTRAVENOUS | Status: DC | PRN
  Administered 2012-03-14: 07:00:00 via INTRAVENOUS

## 2012-03-14 MED ORDER — MIDAZOLAM HCL 5 MG/5ML IJ SOLN
INTRAMUSCULAR | Status: DC | PRN
  Administered 2012-03-14: 2 mg via INTRAVENOUS

## 2012-03-14 MED ORDER — PROMETHAZINE HCL 25 MG/ML IJ SOLN: 6.2500 mg | INTRAMUSCULAR | Status: DC | PRN

## 2012-03-14 MED ORDER — SUCCINYLCHOLINE CHLORIDE 20 MG/ML IJ SOLN
INTRAMUSCULAR | Status: DC | PRN
  Administered 2012-03-14: 140 mg via INTRAVENOUS

## 2012-03-14 MED ORDER — PROPOFOL 10 MG/ML IV BOLUS
INTRAVENOUS | Status: DC | PRN
  Administered 2012-03-14: 30 mg via INTRAVENOUS
  Administered 2012-03-14: 200 mg via INTRAVENOUS

## 2012-03-14 MED ORDER — MINERAL OIL LIGHT 100 % EX OIL
TOPICAL_OIL | CUTANEOUS | Status: AC
  Filled 2012-03-14: qty 25

## 2012-03-14 MED ORDER — FENTANYL CITRATE 0.05 MG/ML IJ SOLN
INTRAMUSCULAR | Status: DC | PRN
  Administered 2012-03-14: 50 ug via INTRAVENOUS
  Administered 2012-03-14: 150 ug via INTRAVENOUS

## 2012-03-14 MED ORDER — HYDROCODONE-ACETAMINOPHEN 5-325 MG PO TABS
1.0000 | ORAL_TABLET | Freq: Once | ORAL | Status: AC
  Administered 2012-03-14: 1 via ORAL

## 2012-03-14 MED ORDER — HYDROMORPHONE HCL PF 1 MG/ML IJ SOLN
INTRAMUSCULAR | Status: AC
  Filled 2012-03-14: qty 1

## 2012-03-14 MED ORDER — ROCURONIUM BROMIDE 100 MG/10ML IV SOLN
INTRAVENOUS | Status: DC | PRN
  Administered 2012-03-14: 40 mg via INTRAVENOUS

## 2012-03-14 MED ORDER — HYDROCODONE-ACETAMINOPHEN 5-325 MG PO TABS
ORAL_TABLET | ORAL | Status: AC
  Filled 2012-03-14: qty 2

## 2012-03-14 MED ORDER — HYDROMORPHONE HCL PF 1 MG/ML IJ SOLN
0.2500 mg | INTRAMUSCULAR | Status: DC | PRN
  Administered 2012-03-14: 0.5 mg via INTRAVENOUS

## 2012-03-14 SURGICAL SUPPLY — 49 items
ADH SKN CLS APL DERMABOND .7 (GAUZE/BANDAGES/DRESSINGS) ×1
APL SKNCLS STERI-STRIP NONHPOA (GAUZE/BANDAGES/DRESSINGS) ×1
BALL CTTN LRG ABS STRL LF (GAUZE/BANDAGES/DRESSINGS)
BENZOIN TINCTURE PRP APPL 2/3 (GAUZE/BANDAGES/DRESSINGS) ×2 IMPLANT
BLADE SURG 10 STRL SS (BLADE) ×2 IMPLANT
BLADE SURG 15 STRL LF DISP TIS (BLADE) ×1 IMPLANT
BLADE SURG 15 STRL SS (BLADE) ×2
BLADE SURG ROTATE 9660 (MISCELLANEOUS) ×1 IMPLANT
CANISTER SUCTION 2500CC (MISCELLANEOUS) ×1 IMPLANT
CHLORAPREP W/TINT 26ML (MISCELLANEOUS) ×2 IMPLANT
CLOTH BEACON ORANGE TIMEOUT ST (SAFETY) ×2 IMPLANT
COTTONBALL LRG STERILE PKG (GAUZE/BANDAGES/DRESSINGS) ×1 IMPLANT
COVER SURGICAL LIGHT HANDLE (MISCELLANEOUS) ×2 IMPLANT
DECANTER SPIKE VIAL GLASS SM (MISCELLANEOUS) ×2 IMPLANT
DERMABOND ADVANCED (GAUZE/BANDAGES/DRESSINGS) ×1
DERMABOND ADVANCED .7 DNX12 (GAUZE/BANDAGES/DRESSINGS) IMPLANT
DRAPE PED LAPAROTOMY (DRAPES) ×2 IMPLANT
DRAPE UTILITY 15X26 W/TAPE STR (DRAPE) ×4 IMPLANT
ELECT CAUTERY BLADE 6.4 (BLADE) ×2 IMPLANT
ELECT REM PT RETURN 9FT ADLT (ELECTROSURGICAL) ×2
ELECTRODE REM PT RTRN 9FT ADLT (ELECTROSURGICAL) ×1 IMPLANT
GLOVE EUDERMIC 7 POWDERFREE (GLOVE) ×2 IMPLANT
GOWN PREVENTION PLUS XLARGE (GOWN DISPOSABLE) ×2 IMPLANT
GOWN STRL NON-REIN LRG LVL3 (GOWN DISPOSABLE) ×2 IMPLANT
KIT BASIN OR (CUSTOM PROCEDURE TRAY) ×2 IMPLANT
KIT ROOM TURNOVER OR (KITS) ×2 IMPLANT
MESH HERNIA 3X6 (Mesh General) ×1 IMPLANT
NDL HYPO 25GX1X1/2 BEV (NEEDLE) ×1 IMPLANT
NEEDLE HYPO 25GX1X1/2 BEV (NEEDLE) ×2 IMPLANT
NS IRRIG 1000ML POUR BTL (IV SOLUTION) ×2 IMPLANT
PACK SURGICAL SETUP 50X90 (CUSTOM PROCEDURE TRAY) ×2 IMPLANT
PAD ARMBOARD 7.5X6 YLW CONV (MISCELLANEOUS) ×4 IMPLANT
PENCIL BUTTON HOLSTER BLD 10FT (ELECTRODE) ×2 IMPLANT
SPONGE GAUZE 4X4 12PLY (GAUZE/BANDAGES/DRESSINGS) ×2 IMPLANT
SPONGE GAUZE 4X4 16PLY UNSTER (WOUND CARE) ×2 IMPLANT
SPONGE INTESTINAL PEANUT (DISPOSABLE) IMPLANT
SPONGE LAP 18X18 X RAY DECT (DISPOSABLE) ×2 IMPLANT
SPONGE LAP 4X18 X RAY DECT (DISPOSABLE) ×2 IMPLANT
STAPLER VISISTAT 35W (STAPLE) ×2 IMPLANT
STRIP CLOSURE SKIN 1/2X4 (GAUZE/BANDAGES/DRESSINGS) ×2 IMPLANT
SUT MON AB 4-0 PC3 18 (SUTURE) ×2 IMPLANT
SUT PROLENE 0 CT 1 CR/8 (SUTURE) ×2 IMPLANT
SUT VIC AB 3-0 SH 18 (SUTURE) ×2 IMPLANT
SYR BULB 3OZ (MISCELLANEOUS) ×2 IMPLANT
SYR CONTROL 10ML LL (SYRINGE) ×2 IMPLANT
TOWEL OR 17X24 6PK STRL BLUE (TOWEL DISPOSABLE) ×2 IMPLANT
TOWEL OR 17X26 10 PK STRL BLUE (TOWEL DISPOSABLE) ×2 IMPLANT
TUBE CONNECTING 12X1/4 (SUCTIONS) ×1 IMPLANT
YANKAUER SUCT BULB TIP NO VENT (SUCTIONS) ×1 IMPLANT

## 2012-03-14 NOTE — Op Note (Signed)
TACARI CLOUTIER 04-29-1945 [id number] 02/27/2012  Preoperative diagnosis: chronically incarcerated umbilical hernia  Postoperative diagnosis: same  Procedure: Umbilical Hernia Repair,with resection of incarcerated omentum and mesh repair  Surgeon: Haywood Lasso, MD, FACS  Anesthesia: General   Clinical History and Indications: this patient has a gradually enlarging but asymptomatic umbilical hernia. However, it was noticed that the skin overlying the hernia was getting very thin and the hernia is not reducible. I thought that it had omentum in it.  Procedure: The patient was seen in the preoperative area and the plans for the procedure reviewed again. He  had no further questions. I marked the area of the umbilicus as the operative site. He wishes to prodeed.  The patient was taken to the operating room and after satisfactory Gen. endotracheal anesthesia had been obtained the area was clipped as needed, prepped and draped. The timeout was performed.  I used some 0.25% plain Marcaine local anesthesia to help with postoperative pain management. This was infiltrated around the umbilical area and additional infiltrated as I worked.  A curvilinear incision was made on the inferior aspect of the umbilicus. The umbilical skin was elevated off of the hernia sac. The hernia sac was dissected free of the subcutaneous tissues.  Upon entering the hernia sac it was obvious there was a fair amount of omentum within it. This was freed up but was unable to reduce it because the actual fascial defect was quite small, about 1.5 cm. I therefore divided this excess omentum over clamps and resected. 2 silk ties were used. I was unable to reduce the remaining omentum. I then freed up the subcutaneous tissue from the fascia in all directions so I could put an onlay of mesh. I placed 3 sutures of 0 Prolene to close the defect. A left the 2 ends tied but on cut. The mesh was then threaded onto the stay sutures and  tied down. Additional anchoring sutures were used to anchor the mesh to the fascia circumferentially. Additional local was infiltrated. The wound was irrigated and checked for hemostasis. Everything appeared to be dry.  Once the repair was complete the incision was closed by using some 3-0 Vicryl subcutaneous and 4-0 Monocryl subcuticular sutures.  The patient tolerated the procedure well. There were no operative complications. There was minimal blood loss. All counts were correct. He was taken to the PACU in satisfactory condition.  Haywood Lasso, MD, FACS 03/14/2012 11:06 AM

## 2012-03-14 NOTE — Interval H&P Note (Signed)
History and Physical Interval Note:  03/14/2012 7:10 AM  [name]  has presented today for surgery, with the diagnosis of umbilical hernia   The various methods of treatment have been discussed with the patient and family. After consideration of risks, benefits and other options for treatment, the patient has consented to  Procedure(s) (LRB) with comments: HERNIA REPAIR UMBILICAL ADULT (N/A) as a surgical intervention .  The patient's history has been reviewed, patient examined, no change in status, stable for surgery.  I have reviewed the patient's chart and labs.  Questions were answered to the patient's satisfaction.   I have marked the umbilical area as the surgical site  [name] J

## 2012-03-14 NOTE — Anesthesia Procedure Notes (Signed)
Procedure Name: Intubation Date/Time: 03/14/2012 7:33 AM Performed by: Maeola Harman Pre-anesthesia Checklist: Patient identified, Emergency Drugs available, Suction available, Patient being monitored and Timeout performed Patient Re-evaluated:Patient Re-evaluated prior to inductionOxygen Delivery Method: Circle system utilized Preoxygenation: Pre-oxygenation with 100% oxygen Intubation Type: IV induction, Cricoid Pressure applied and Rapid sequence Ventilation: Two handed mask ventilation required Laryngoscope Size: Mac and 4 Grade View: Grade III Tube type: Oral Tube size: 7.5 mm Number of attempts: 1 Airway Equipment and Method: Stylet Placement Confirmation: ETT inserted through vocal cords under direct vision,  positive ETCO2 and breath sounds checked- equal and bilateral Secured at: 23 cm Tube secured with: Tape Dental Injury: Teeth and Oropharynx as per pre-operative assessment  Future Recommendations: Recommend- induction with short-acting agent, and alternative techniques readily available

## 2012-03-14 NOTE — H&P (View-Only) (Signed)
Patient ID: [name], male   DOB: [birth year], 67 y.o.   MRN: [id number]  Chief Complaint  Patient presents with  . New Evaluation    evaul umb hernia    HPI [name] is a 67 y.o. male.  He presents today for evaluation of a umbilical hernia. He has had for at least 20 years. It is not really bothering him but he is worried about it. It is gradually getting larger and he notes that the skin overlying it has become thin. HPI  Past Medical History  Diagnosis Date  . Hypertension   . Sleep apnea     no cpap  . Headache   . Arthritis     gouty  . Vertigo 2012    takes "med" every other day  . Sore neck     d/t recent fall in tub  . Arrhythmia     pt doesnt know type    Past Surgical History  Procedure Date  . Rt shoulder repair 2009  . Thymus gland 2007  . Back csyt removed 1992  . Left knee arthroscopy 2001  . Circumcision 07/04/2011    Procedure: CIRCUMCISION ADULT;  Surgeon: Malka So, MD;  Location: [facility];  Service: Urology;  Laterality: N/A;    Family History  Problem Relation Age of Onset  . Heart disease Mother   . Heart disease Father   . Cancer Maternal Uncle     Colon    Social History History  Substance Use Topics  . Smoking status: Former Smoker    Quit date: 07/02/1980  . Smokeless tobacco: Never Used  . Alcohol Use: No    Allergies  Allergen Reactions  . Advil (Ibuprofen) Hives  . Amoxicillin Swelling  . Morphine And Related Nausea And Vomiting    Current Outpatient Prescriptions  Medication Sig Dispense Refill  . aspirin 81 MG tablet Take 81 mg by mouth every other day.      . indomethacin (INDOCIN) 25 MG capsule Take 50 mg by mouth 2 (two) times daily with a meal.      . olmesartan (BENICAR) 40 MG tablet Take 40 mg by mouth every morning.      . busPIRone (BUSPAR) 30 MG tablet         Review of Systems Review of Systems  Constitutional: Negative for fever, chills and unexpected weight change.  HENT:  Negative for hearing loss, congestion, sore throat, trouble swallowing and voice change.   Eyes: Negative for visual disturbance.  Respiratory: Negative for cough and wheezing.   Cardiovascular: Negative for chest pain, palpitations and leg swelling.  Gastrointestinal: Negative for nausea, vomiting, abdominal pain, diarrhea, constipation, blood in stool, abdominal distention, anal bleeding and rectal pain.  Genitourinary: Negative for hematuria and difficulty urinating.  Musculoskeletal: Negative for arthralgias.  Skin: Negative for rash and wound.  Neurological: Negative for seizures, syncope, weakness and headaches.  Hematological: Negative for adenopathy. Does not bruise/bleed easily.  Psychiatric/Behavioral: Negative for confusion.    Blood pressure 160/90, pulse 84, temperature 98.3 F (36.8 C), temperature source Temporal, resp. rate 18, height 5\' 9"  (1.753 m), weight 318 lb 9.6 oz (144.516 kg).  Physical Exam Physical Exam  Vitals reviewed. Constitutional: He is oriented to person, place, and time. He appears well-developed and well-nourished. No distress.  HENT:  Head: Normocephalic and atraumatic.  Eyes: Conjunctivae normal and EOM are normal. Pupils are equal, round, and reactive to light. Left eye exhibits no discharge.  Neck:  Normal range of motion. Neck supple. No tracheal deviation present. No thyromegaly present.  Cardiovascular: Normal rate, regular rhythm, normal heart sounds and intact distal pulses.  Exam reveals no gallop and no friction rub.   No murmur heard. Pulmonary/Chest: Effort normal and breath sounds normal. No respiratory distress. He has no wheezes. He has no rales.  Abdominal: Soft. Bowel sounds are normal. He exhibits no distension and no mass. There is no tenderness. There is no rebound and no guarding.       He has a partially reducible umbilical hernia which I believe contains omentum. I think the defect was about 2.5 cm. The skin overlying it is thin  with some neovascularization. It is not tender. It is soft.  Musculoskeletal: Normal range of motion. He exhibits no edema.  Neurological: He is alert and oriented to person, place, and time.  Skin: Skin is warm and dry. No erythema.  Psychiatric: He has a normal mood and affect. His behavior is normal. Judgment and thought content normal.    Data Reviewed I have reviewed over the notes the electronic medical record  Assessment    1. Chronically incarcerated umbilical hernia containing omentum 2. Morbid obesity 3. Gout, currently asymptomatic 4. History of thymectomy    Plan    I think he is a candidate for an umbilical hernia. He does not take it with mesh. I told his high risk for recurrence given his obesity. However because of the skin becoming somewhat thin I'm concerned that he'll eventually have skin necrosis if this gets much larger. He is otherwise in stable medical condition I think he can undergo surgery. I discussed the other risks and complications as well with him and answered all questions. He would like to have surgery scheduled.       [name] J 02/27/2012, 10:42 AM

## 2012-03-14 NOTE — Anesthesia Preprocedure Evaluation (Addendum)
Anesthesia Evaluation  Patient identified by MRN, date of birth, ID band Patient awake    Reviewed: Allergy & Precautions, H&P , NPO status , Patient's Chart, lab work & pertinent test results  History of Anesthesia Complications Negative for: history of anesthetic complications  Airway Mallampati: II TM Distance: >3 FB Neck ROM: Full    Dental  (+) Teeth Intact, Dental Advisory Given and Poor Dentition   Pulmonary sleep apnea and Continuous Positive Airway Pressure Ventilation ,  breath sounds clear to auscultation  Pulmonary exam normal       Cardiovascular hypertension, Rhythm:Irregular Rate:Normal     Neuro/Psych  Headaches, Anxiety    GI/Hepatic negative GI ROS, Neg liver ROS,   Endo/Other  negative endocrine ROSMorbid obesity  Renal/GU negative Renal ROS     Musculoskeletal negative musculoskeletal ROS (+)   Abdominal (+)  Abdomen: soft.    Peds  Hematology negative hematology ROS (+)   Anesthesia Other Findings EKG SR with RBBB, Normal ECHO, Normal EF.  Cardiac cath clear in 2007.  OK to proceed per Dr. Linna Caprice.  Waldron Session, CRNA  Reproductive/Obstetrics negative OB ROS                       Anesthesia Physical Anesthesia Plan  ASA: III  Anesthesia Plan: General   Post-op Pain Management:    Induction: Intravenous  Airway Management Planned: Oral ETT  Additional Equipment:   Intra-op Plan:   Post-operative Plan: Extubation in OR  Informed Consent: I have reviewed the patients History and Physical, chart, labs and discussed the procedure including the risks, benefits and alternatives for the proposed anesthesia with the patient or authorized representative who has indicated his/her understanding and acceptance.   Dental advisory given  Plan Discussed with: CRNA, Anesthesiologist and Surgeon  Anesthesia Plan Comments:        Anesthesia Quick Evaluation

## 2012-03-14 NOTE — Preoperative (Signed)
Beta Blockers   Reason not to administer Beta Blockers:Not Applicable 

## 2012-03-14 NOTE — Anesthesia Postprocedure Evaluation (Signed)
Anesthesia Post Note  Patient: [name]  Procedure(s) Performed: Procedure(s) (LRB): HERNIA REPAIR UMBILICAL ADULT (N/A) INSERTION OF MESH (N/A)  Anesthesia type: general  Patient location: PACU  Post pain: Pain level controlled  Post assessment: Patient's Cardiovascular Status Stable  Last Vitals:  Filed Vitals:   03/14/12 0843  BP:   Pulse:   Temp: 36.7 C  Resp:     Post vital signs: Reviewed and stable  Level of consciousness: sedated  Complications: No apparent anesthesia complications

## 2012-03-14 NOTE — Transfer of Care (Signed)
Immediate Anesthesia Transfer of Care Note  Patient: [name]  Procedure(s) Performed: Procedure(s) (LRB) with comments: HERNIA REPAIR UMBILICAL ADULT (N/A) INSERTION OF MESH (N/A)  Patient Location: PACU  Anesthesia Type:General  Level of Consciousness: awake, alert  and oriented  Airway & Oxygen Therapy: Patient connected to face mask  Post-op Assessment: Report given to PACU RN  Post vital signs: stable  Complications: No apparent anesthesia complications

## 2012-03-15 ENCOUNTER — Encounter (HOSPITAL_COMMUNITY): Payer: Self-pay | Admitting: Surgery

## 2012-03-29 ENCOUNTER — Encounter (INDEPENDENT_AMBULATORY_CARE_PROVIDER_SITE_OTHER): Payer: Self-pay | Admitting: Surgery

## 2012-03-29 ENCOUNTER — Ambulatory Visit (INDEPENDENT_AMBULATORY_CARE_PROVIDER_SITE_OTHER): Payer: Medicare Other | Admitting: Surgery

## 2012-03-29 VITALS — BP 152/96 | HR 68 | Temp 97.9°F | Resp 20 | Ht 69.0 in | Wt 318.0 lb

## 2012-03-29 DIAGNOSIS — Z09 Encounter for follow-up examination after completed treatment for conditions other than malignant neoplasm: Secondary | ICD-10-CM

## 2012-03-29 NOTE — Patient Instructions (Signed)
We will see you again on an as needed basis. Please call the office at [phone] if you have any questions or concerns. Thank you for allowing us to take care of you.  

## 2012-03-29 NOTE — Progress Notes (Signed)
NAME: [name]                                            DOB: 1945/11/21 DATE: 03/29/2012                                                  MRN: [id number]  CC:  Chief Complaint  Patient presents with  . Routine Post Op    1st PO umb hernia 03/14/12    HPI: This patient comes in for post op follow-up .Heunderwent repair of an umbilical hernia on [id number]. He feels that he is doing well.he has some postoperative discomfort for couple days after surgery, but now is doing well.  PE:  VITAL SIGNS: BP 152/96  Pulse 68  Temp 97.9 F (36.6 C) (Temporal)  Resp 20  Ht 5\' 9"  (1.753 m)  Wt 318 lb (144.244 kg)  BMI 46.96 kg/m2  General: The patient appears to be healthy, NAD Abdomen: Soft and benign. There is some firmness of the umbilical hernia repair site which I think is just postsurgical. No evidence of hematoma, bleeding, infection. Repair appeared solid.   IMPRESSION: The patient is doing well S/P umbilical hernia repair.    PLAN: May resume normal activities. We will see back when necessary.

## 2012-06-17 ENCOUNTER — Ambulatory Visit (INDEPENDENT_AMBULATORY_CARE_PROVIDER_SITE_OTHER): Payer: Medicare Other | Admitting: Internal Medicine

## 2012-06-17 ENCOUNTER — Encounter: Payer: Self-pay | Admitting: Internal Medicine

## 2012-06-17 VITALS — BP 158/80 | HR 77 | Temp 97.9°F | Wt 332.6 lb

## 2012-06-17 DIAGNOSIS — R0600 Dyspnea, unspecified: Secondary | ICD-10-CM

## 2012-06-17 DIAGNOSIS — R0989 Other specified symptoms and signs involving the circulatory and respiratory systems: Secondary | ICD-10-CM

## 2012-06-17 DIAGNOSIS — R0609 Other forms of dyspnea: Secondary | ICD-10-CM

## 2012-06-17 NOTE — Patient Instructions (Addendum)
Please have breathing test called PFT; further advise based on those results which we will be in touch by phone Please see one of the sleep doctors in our office

## 2012-06-17 NOTE — Progress Notes (Signed)
Subjective:    Patient ID: [name], male    DOB: 1945/07/15, 67 y.o.   MRN: [id number] PCP is Purvis Kilts, MD Body mass index is 49.09 kg/(m^2).  reports that he quit smoking about 31 years ago. His smoking use included Cigarettes. He has a 4 pack-year smoking history. He has never used smokeless tobacco.  HPI 67 year old morbidly obese male self referred for dyspnea. He is an ex-smoker. He carries a diagnosis of obstructive sleep apnea not otherwise specified since 2007 but is noncompliant with CPAP  - Reports dyspnea in early 2007 along with some cough and hemoptysis. At that time was diagnosed with "thymoma". He underwent surgical resection by Dr. 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 Therefore he was not subjected to annual followup CT scans and was discharged from followup. However after that surgery his dyspnea and cough resolved. Since then he has been doing relatively well other than the fact that he has had weight gain and weight loss to significant degrees in yo yo l fashion. He currently weighs 336 pounds but a few years ago weighed less than 280 pounds but then his wife died and he went into depression and gained his weight. 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 Currently he is noticing dyspnea only for the last several weeks. Dyspnea is partially exertional and relieved with rest. However sometimes and often it occurs at rest. He says that he almost feels like he's not able to take a good deep breath and then sometimes has to "snap out of it". Dyspnea is associated with excessive daytime somnolence, fatigue and also snoring. He says he has gotten some snoring. There is also an associated significant weight gain and sedentary lifestyle and orthopnea. There is no pedal edema or paroxysmal  nocturnal dyspnea  -Of note,  has not had prior pulmonary function test   Tablet or walk test in our office 185 feet x3 laps:  DID NOT DESATURATE. Pkea HR 109 (rest was 80) and peak pulse ox 95%, suggestive of deconditiponing   LABS CT Angiio July 2009 - No PE, Clear parenchyma July 2009 ECHO - Normal Mar 11, 2012 - cxr clear, creat 1.22mg % and hgb 13.8gm%   Past Medical History  Diagnosis Date  . Hypertension   . Headache   . Arthritis     gouty  . Vertigo 2012    takes "med" every other day  . Sore neck     d/t recent fall in tub  . Arrhythmia     pt doesnt know type  . Sleep apnea     cpap-setting 7-will bring mask on DOS- MD feels pt does not have after THymus removed  . Anxiety      Family History  Problem Relation Age of Onset  . Heart disease Mother   . Heart disease Father   . Cancer Maternal Uncle     Colon     History   Social History  . Marital Status: Widowed    Spouse Name: N/A    Number of Children: N/A  . Years of Education: N/A   Occupational History  . disabled    Social History Main Topics  . Smoking status: Former Smoker -- 1.00 packs/day for 4 years    Types: Cigarettes  Quit date: 07/02/1980  . Smokeless tobacco: Never Used  . Alcohol Use: No  . Drug Use: No  . Sexually Active: Not on file   Other Topics Concern  . Not on file   Social History Narrative  . No narrative on file     Allergies  Allergen Reactions  . Advil (Ibuprofen) Hives  . Amoxicillin Swelling  . Morphine And Related Nausea And Vomiting     Outpatient Prescriptions Prior to Visit  Medication Sig Dispense Refill  . aspirin 81 MG tablet Take 81 mg by mouth every other day.      Marland Kitchen HYDROcodone-acetaminophen (NORCO) 5-325 MG per tablet Take 1 tablet by mouth every 4 (four) hours as needed for pain.  30 tablet  0  . indomethacin (INDOCIN) 25 MG capsule Take 25 mg by mouth 4 (four) times daily.       . meclizine (ANTIVERT) 25 MG tablet Take 25 mg by mouth 3  (three) times daily as needed.       Marland Kitchen olmesartan (BENICAR) 40 MG tablet Take 40 mg by mouth every morning.       No facility-administered medications prior to visit.       Review of Systems  Constitutional: Negative for fever and unexpected weight change.  HENT: Positive for dental problem. Negative for ear pain, nosebleeds, congestion, sore throat, rhinorrhea, sneezing, trouble swallowing, postnasal drip and sinus pressure.   Eyes: Negative for redness and itching.  Respiratory: Positive for cough and shortness of breath. Negative for chest tightness and wheezing.   Cardiovascular: Positive for palpitations and leg swelling.  Gastrointestinal: Negative for nausea and vomiting.  Genitourinary: Negative for dysuria.  Musculoskeletal: Negative for joint swelling.  Skin: Negative for rash.  Neurological: Positive for headaches.  Hematological: Does not bruise/bleed easily.  Psychiatric/Behavioral: Positive for dysphoric mood. The patient is nervous/anxious.        Objective:   Physical Exam  Nursing note and vitals reviewed. Constitutional: He is oriented to person, place, and time. He appears well-developed and well-nourished. No distress.  Morbidly obese Sitting with cane Body mass index is 49.09 kg/(m^2).   HENT:  Head: Normocephalic and atraumatic.  Right Ear: External ear normal.  Left Ear: External ear normal.  Mouth/Throat: Oropharynx is clear and moist. No oropharyngeal exudate.  mallampatti class 3-4  Eyes: Conjunctivae and EOM are normal. Pupils are equal, round, and reactive to light. Right eye exhibits no discharge. Left eye exhibits no discharge. No scleral icterus.  Neck: Normal range of motion. Neck supple. No JVD present. No tracheal deviation present. No thyromegaly present.  Cardiovascular: Normal rate, regular rhythm and intact distal pulses.  Exam reveals no gallop and no friction rub.   No murmur heard. Scar of thymoma surgery +  Pulmonary/Chest: Effort  normal and breath sounds normal. No respiratory distress. He has no wheezes. He has no rales. He exhibits no tenderness.  Abdominal: Soft. Bowel sounds are normal. He exhibits no distension and no mass. There is no tenderness. There is no rebound and no guarding.  Visceral obesity  Musculoskeletal: Normal range of motion. He exhibits no edema and no tenderness.  Lymphadenopathy:    He has no cervical adenopathy.  Neurological: He is alert and oriented to person, place, and time. He has normal reflexes. No cranial nerve deficit. Coordination normal.  Skin: Skin is warm and dry. No rash noted. He is not diaphoretic. No erythema. No pallor.  Psychiatric: He has a normal mood and affect. His behavior is  normal. Judgment and thought content normal.          Assessment & Plan:

## 2012-06-19 DIAGNOSIS — R0600 Dyspnea, unspecified: Secondary | ICD-10-CM | POA: Insufficient documentation

## 2012-06-19 NOTE — Assessment & Plan Note (Signed)
I suspect is multifactorial dyspnea related to most likely obesity and deconditioning and possibly associated diastolic dysfunction of the heart. Nevertheless cannot rule out pulmonary causes. We'll start with a pulmonary function test. He'll stay in touch over telephone. Based on the results of the thyroid function test next test could include CT scan of cardiopulmonary stress test

## 2012-06-24 ENCOUNTER — Ambulatory Visit (INDEPENDENT_AMBULATORY_CARE_PROVIDER_SITE_OTHER): Payer: Medicare Other | Admitting: Internal Medicine

## 2012-06-24 DIAGNOSIS — R0609 Other forms of dyspnea: Secondary | ICD-10-CM

## 2012-06-24 DIAGNOSIS — R0600 Dyspnea, unspecified: Secondary | ICD-10-CM

## 2012-06-24 DIAGNOSIS — R0989 Other specified symptoms and signs involving the circulatory and respiratory systems: Secondary | ICD-10-CM

## 2012-06-24 LAB — PULMONARY FUNCTION TEST

## 2012-06-24 NOTE — Progress Notes (Signed)
PFT done today. 

## 2012-06-28 ENCOUNTER — Telehealth: Payer: Self-pay | Admitting: Internal Medicine

## 2012-06-28 DIAGNOSIS — R0602 Shortness of breath: Secondary | ICD-10-CM

## 2012-06-28 NOTE — Telephone Encounter (Signed)
pft 06/24/12 is normal. Please let him know that and tell him he needs pulmonary stress test on bike (will have to bike for 12 minutes). Then,  Please set up CPST with EIB chalenge and then fu to see me (I tried to order but could not find order)  Dr. Brand Males, M.D., [facility].C.P Pulmonary and Critical Care Medicine Staff Physician [place] [facility] Pager: [phone], If no answer or between  15:00h - 7:00h: call [phone]  06/28/2012 5:07 PM

## 2012-07-02 NOTE — Telephone Encounter (Signed)
Pt aware and ok to have cpst so order placed.[place], CMA

## 2012-07-03 ENCOUNTER — Telehealth: Payer: Self-pay | Admitting: Internal Medicine

## 2012-07-03 ENCOUNTER — Institutional Professional Consult (permissible substitution): Payer: Medicare Other | Admitting: Pulmonary Disease

## 2012-07-03 NOTE — Telephone Encounter (Signed)
Will forward to MR as an FYI 

## 2012-07-05 NOTE — Telephone Encounter (Signed)
pls give him fu in 3 months so I can review at that point if he needs it or if he is even able to . Please pass best wishes for a speedy recovery and we appreciate him keeping [place] in loop  Dr. Brand Males, M.D., [facility] [place].C.P Pulmonary and Critical Care Medicine Staff Physician [place] [facility] Pager: [phone], If no answer or between  15:00h - 7:00h: call [phone]  07/05/2012 6:33 AM

## 2012-07-05 NOTE — Telephone Encounter (Signed)
Patient aware of MR msg below Spoke with patient, patient has been scheduled for Thurs July 31,2014 @ 2pm Nothing further needed at this time

## 2012-07-08 ENCOUNTER — Encounter (HOSPITAL_COMMUNITY): Payer: Medicare Other

## 2012-09-26 ENCOUNTER — Ambulatory Visit: Payer: Medicare Other | Admitting: Internal Medicine

## 2013-02-25 ENCOUNTER — Inpatient Hospital Stay (HOSPITAL_COMMUNITY)
Admission: AD | Admit: 2013-02-25 | Discharge: 2013-02-28 | DRG: 384 | Disposition: A | Discharge status: Home Health | Payer: Medicare Other | Attending: Internal Medicine | Admitting: Internal Medicine

## 2013-02-25 ENCOUNTER — Inpatient Hospital Stay (HOSPITAL_COMMUNITY): Payer: Medicare Other

## 2013-02-25 ENCOUNTER — Encounter (HOSPITAL_COMMUNITY): Payer: Self-pay | Admitting: Emergency Medicine

## 2013-02-25 ENCOUNTER — Emergency Department (HOSPITAL_COMMUNITY): Payer: Medicare Other

## 2013-02-25 DIAGNOSIS — I10 Essential (primary) hypertension: Secondary | ICD-10-CM | POA: Diagnosis present

## 2013-02-25 DIAGNOSIS — Z7982 Long term (current) use of aspirin: Secondary | ICD-10-CM

## 2013-02-25 DIAGNOSIS — D72829 Elevated white blood cell count, unspecified: Secondary | ICD-10-CM | POA: Diagnosis present

## 2013-02-25 DIAGNOSIS — Z8249 Family history of ischemic heart disease and other diseases of the circulatory system: Secondary | ICD-10-CM

## 2013-02-25 DIAGNOSIS — Z6841 Body Mass Index (BMI) 40.0 and over, adult: Secondary | ICD-10-CM

## 2013-02-25 DIAGNOSIS — R0902 Hypoxemia: Secondary | ICD-10-CM | POA: Diagnosis present

## 2013-02-25 DIAGNOSIS — M545 Low back pain, unspecified: Secondary | ICD-10-CM | POA: Diagnosis present

## 2013-02-25 DIAGNOSIS — K253 Acute gastric ulcer without hemorrhage or perforation: Principal | ICD-10-CM | POA: Diagnosis present

## 2013-02-25 DIAGNOSIS — Z791 Long term (current) use of non-steroidal anti-inflammatories (NSAID): Secondary | ICD-10-CM

## 2013-02-25 DIAGNOSIS — R0789 Other chest pain: Secondary | ICD-10-CM | POA: Diagnosis present

## 2013-02-25 DIAGNOSIS — M659 Unspecified synovitis and tenosynovitis, unspecified site: Secondary | ICD-10-CM | POA: Diagnosis present

## 2013-02-25 DIAGNOSIS — A048 Other specified bacterial intestinal infections: Secondary | ICD-10-CM | POA: Diagnosis present

## 2013-02-25 DIAGNOSIS — R079 Chest pain, unspecified: Secondary | ICD-10-CM | POA: Diagnosis present

## 2013-02-25 DIAGNOSIS — Z87891 Personal history of nicotine dependence: Secondary | ICD-10-CM

## 2013-02-25 DIAGNOSIS — G4733 Obstructive sleep apnea (adult) (pediatric): Secondary | ICD-10-CM | POA: Diagnosis present

## 2013-02-25 DIAGNOSIS — Z9181 History of falling: Secondary | ICD-10-CM

## 2013-02-25 DIAGNOSIS — K5900 Constipation, unspecified: Secondary | ICD-10-CM | POA: Diagnosis present

## 2013-02-25 DIAGNOSIS — I129 Hypertensive chronic kidney disease with stage 1 through stage 4 chronic kidney disease, or unspecified chronic kidney disease: Secondary | ICD-10-CM | POA: Diagnosis present

## 2013-02-25 DIAGNOSIS — M109 Gout, unspecified: Secondary | ICD-10-CM

## 2013-02-25 DIAGNOSIS — R0600 Dyspnea, unspecified: Secondary | ICD-10-CM

## 2013-02-25 DIAGNOSIS — R1110 Vomiting, unspecified: Secondary | ICD-10-CM | POA: Diagnosis present

## 2013-02-25 DIAGNOSIS — D62 Acute posthemorrhagic anemia: Secondary | ICD-10-CM | POA: Diagnosis present

## 2013-02-25 DIAGNOSIS — K219 Gastro-esophageal reflux disease without esophagitis: Secondary | ICD-10-CM | POA: Diagnosis present

## 2013-02-25 DIAGNOSIS — E662 Morbid (severe) obesity with alveolar hypoventilation: Secondary | ICD-10-CM | POA: Diagnosis present

## 2013-02-25 DIAGNOSIS — N182 Chronic kidney disease, stage 2 (mild): Secondary | ICD-10-CM | POA: Diagnosis present

## 2013-02-25 DIAGNOSIS — M130 Polyarthritis, unspecified: Secondary | ICD-10-CM | POA: Diagnosis present

## 2013-02-25 DIAGNOSIS — F411 Generalized anxiety disorder: Secondary | ICD-10-CM | POA: Diagnosis present

## 2013-02-25 DIAGNOSIS — R195 Other fecal abnormalities: Secondary | ICD-10-CM | POA: Diagnosis present

## 2013-02-25 HISTORY — DX: Acute gastric ulcer without hemorrhage or perforation: K25.3

## 2013-02-25 LAB — CBC WITH DIFFERENTIAL/PLATELET
Basophils Absolute: 0 10*3/uL (ref 0.0–0.1)
HCT: 25.7 % — ABNORMAL LOW (ref 39.0–52.0)
Lymphocytes Relative: 18 % (ref 12–46)
Lymphs Abs: 2.9 10*3/uL (ref 0.7–4.0)
Neutro Abs: 11.7 10*3/uL — ABNORMAL HIGH (ref 1.7–7.7)
Platelets: 381 10*3/uL (ref 150–400)
RBC: 3.27 MIL/uL — ABNORMAL LOW (ref 4.22–5.81)
RDW: 18.4 % — ABNORMAL HIGH (ref 11.5–15.5)
WBC: 16 10*3/uL — ABNORMAL HIGH (ref 4.0–10.5)

## 2013-02-25 LAB — TROPONIN I
Troponin I: <0.3 ng/mL (ref <0.30)
Troponin I: <0.3 ng/mL (ref <0.30)

## 2013-02-25 LAB — BASIC METABOLIC PANEL
CO2: 28 mEq/L (ref 19–32)
Calcium: 9.1 mg/dL (ref 8.4–10.5)
Chloride: 95 mEq/L — ABNORMAL LOW (ref 96–112)
Creatinine, Ser: 1.25 mg/dL (ref 0.50–1.35)
GFR calc Af Amer: 68 mL/min — ABNORMAL LOW (ref >90)
Sodium: 137 mEq/L (ref 137–147)

## 2013-02-25 LAB — IRON AND TIBC
Iron: <10 ug/dL — ABNORMAL LOW (ref 42–135)
UIBC: 267 ug/dL (ref 125–400)

## 2013-02-25 LAB — URINALYSIS, ROUTINE W REFLEX MICROSCOPIC
Leukocytes, UA: NEGATIVE
Nitrite: NEGATIVE
Specific Gravity, Urine: 1.015 (ref 1.005–1.030)
Urobilinogen, UA: 0.2 mg/dL (ref 0.0–1.0)
pH: 5.5 (ref 5.0–8.0)

## 2013-02-25 LAB — URINE MICROSCOPIC-ADD ON

## 2013-02-25 LAB — OCCULT BLOOD, POC DEVICE: Fecal Occult Bld: POSITIVE — AB

## 2013-02-25 MED ORDER — ACETAMINOPHEN 325 MG PO TABS: 650.0000 mg | ORAL_TABLET | Freq: Four times a day (QID) | ORAL | Status: DC | PRN

## 2013-02-25 MED ORDER — MORPHINE SULFATE 2 MG/ML IJ SOLN: 2.0000 mg | INTRAMUSCULAR | Status: DC | PRN

## 2013-02-25 MED ORDER — MUSCLE RUB 10-15 % EX CREA
TOPICAL_CREAM | CUTANEOUS | Status: DC | PRN
  Administered 2013-02-25 – 2013-02-27 (×2): via TOPICAL
  Filled 2013-02-25: qty 85

## 2013-02-25 MED ORDER — IRBESARTAN 300 MG PO TABS
300.0000 mg | ORAL_TABLET | Freq: Every day | ORAL | Status: DC
  Administered 2013-02-25 – 2013-02-26 (×2): 300 mg via ORAL
  Filled 2013-02-25 (×2): qty 1

## 2013-02-25 MED ORDER — MECLIZINE HCL 25 MG PO TABS
25.0000 mg | ORAL_TABLET | Freq: Three times a day (TID) | ORAL | Status: DC | PRN
  Filled 2013-02-25: qty 1

## 2013-02-25 MED ORDER — ASPIRIN 81 MG PO TABS: 81.0000 mg | ORAL_TABLET | ORAL | Status: DC

## 2013-02-25 MED ORDER — SODIUM CHLORIDE 0.9 % IV SOLN
INTRAVENOUS | Status: DC
  Administered 2013-02-25 – 2013-02-26 (×2): via INTRAVENOUS
  Filled 2013-02-25 (×3): qty 1000

## 2013-02-25 MED ORDER — PANTOPRAZOLE SODIUM 40 MG PO TBEC
40.0000 mg | DELAYED_RELEASE_TABLET | Freq: Every day | ORAL | Status: DC
  Administered 2013-02-26 – 2013-02-28 (×3): 40 mg via ORAL
  Filled 2013-02-25 (×3): qty 1

## 2013-02-25 MED ORDER — ONDANSETRON HCL 4 MG/2ML IJ SOLN
4.0000 mg | Freq: Once | INTRAMUSCULAR | Status: AC
  Administered 2013-02-25: 4 mg via INTRAVENOUS
  Filled 2013-02-25: qty 2

## 2013-02-25 MED ORDER — ASPIRIN EC 81 MG PO TBEC
81.0000 mg | DELAYED_RELEASE_TABLET | ORAL | Status: DC
  Administered 2013-02-26 – 2013-02-28 (×2): 81 mg via ORAL
  Filled 2013-02-25 (×2): qty 1

## 2013-02-25 MED ORDER — PREDNISONE 50 MG PO TABS
60.0000 mg | ORAL_TABLET | Freq: Every day | ORAL | Status: DC
  Administered 2013-02-26 – 2013-02-27 (×2): 60 mg via ORAL
  Filled 2013-02-25 (×3): qty 1

## 2013-02-25 MED ORDER — ACETAMINOPHEN 650 MG RE SUPP: 650.0000 mg | Freq: Four times a day (QID) | RECTAL | Status: DC | PRN

## 2013-02-25 MED ORDER — HYDROMORPHONE HCL PF 1 MG/ML IJ SOLN
1.0000 mg | Freq: Once | INTRAMUSCULAR | Status: AC
  Administered 2013-02-25: 1 mg via INTRAVENOUS
  Filled 2013-02-25: qty 1

## 2013-02-25 MED ORDER — SODIUM CHLORIDE 0.9 % IJ SOLN
3.0000 mL | Freq: Two times a day (BID) | INTRAMUSCULAR | Status: DC
  Administered 2013-02-26 – 2013-02-28 (×5): 3 mL via INTRAVENOUS

## 2013-02-25 MED ORDER — ENOXAPARIN SODIUM 80 MG/0.8ML ~~LOC~~ SOLN
75.0000 mg | SUBCUTANEOUS | Status: DC
  Administered 2013-02-25: 75 mg via SUBCUTANEOUS
  Filled 2013-02-25 (×2): qty 0.8

## 2013-02-25 MED ORDER — CYCLOBENZAPRINE HCL 10 MG PO TABS
10.0000 mg | ORAL_TABLET | Freq: Three times a day (TID) | ORAL | Status: DC | PRN
  Administered 2013-02-25 – 2013-02-26 (×2): 10 mg via ORAL
  Filled 2013-02-25 (×2): qty 1

## 2013-02-25 MED ORDER — HYDROCODONE-ACETAMINOPHEN 5-325 MG PO TABS
1.0000 | ORAL_TABLET | ORAL | Status: DC | PRN
  Administered 2013-02-26 – 2013-02-27 (×4): 1 via ORAL
  Filled 2013-02-25 (×4): qty 1

## 2013-02-25 NOTE — ED Notes (Signed)
Pt c/o of left hand swelling and left leg swelling, unable to bear weight. Hx of gout. Pain 8/10.

## 2013-02-25 NOTE — H&P (Signed)
[facility] History and Physical  [name] [id number] DOB: April 05, 1945 DOA: 02/25/2013   PCP: Purvis Kilts, MD   Chief Complaint: Polyarthritis and chest pain  HPI:  67 year old male with history of hypertension, GERD arthritis, osteoarthritis, and obstructive sleep apnea presents with 2-3 day history of worsening polyarthritis and synovitis involving his left knee, left elbow, left wrist, and left shoulder. The lesser extent, the patient is also having bilateral ankle pain and synovitis. The patient normally takes indomethacin 25 mg 4 times daily for the past 2 years. He ran out approximately 2-3 days ago which has correlated with the increase in his synovitis. The patient is also told that he has had osteoarthritis of his left knee, left wrist, left elbow, and left shoulder. He denies any recent injury or falls. However, because of his polyarthritis he has not been able to get out of bed for the past 2-3 days because of pain. When he tried to get out of bed today, his mattress slipped over the  box spring and the patient tried to catch himself from falling during which time he experienced a tearing type of chest discomfort associated with some shortness of breath and diaphoresis. Currently, he denies any chest discomfort, shortness of breath, coughing, hemoptysis. Approximately 3 days ago, the patient felt that he ate some bad food and had nausea and vomiting which has since resolved. He has been eating fine since then and denies any vomiting, diarrhea, or nausea. He has not had any fevers or chills. He denies any dysuria, hematuria, hematochezia. He complaints of melanotic stools.  In the ED, the patient was noted to have serum creatinine 1.25, WBC 16.0, hemoglobin 8.1. Initial troponin was negative. Chest x-ray showed bibasilar atelectasis. FOBT was positive. Assessment/Plan:  polyarthritis/synovitis -His clinical symptoms most likely represent acute gouty arthritis and  correlate with his recent running out of indomethacin -of concern, septic arthritis is still possibility -Request IR for arthrocentesis of left knee -send synovial fluid for cell count, crystal analysis, Gram stain and culture -Empirically start the patient on prednisone 60 mg daily -Check uric acid -X-ray left knee, left wrist, elbow, shoulder -Physical therapy evaluation  Anemia with positive FOBT -concerned about acute blood loss anemia -Patient has chronic use of indomethacin with melena -I have consulted [facility] -Hemoglobin has dropped from 13.8-8.1 in the past 11 months -Check iron studies -Start PPI Atypical chest pain -Likely musculoskeletal -Cycle troponins -Obtain EKG CKD stage II -Baseline creatinine 1.1-1.2 -Judicious IV fluids Leukocytosis -Likely due to acute medical condition and gouty arthritis flare -Continue trending -Afebrile and hemodynamically stable at present -UA with reflex to urine culture -blood cultures x 2 Hypertension -Continue ARB as his renal function is at baseline       Past Medical History  Diagnosis Date  . Hypertension   . Headache(784.0)   . Arthritis     gouty  . Vertigo 2012    takes "med" every other day  . Sore neck     d/t recent fall in tub  . Arrhythmia     pt doesnt know type  . Sleep apnea     cpap-setting 7-will bring mask on DOS- MD feels pt does not have after THymus removed  . Anxiety    Past Surgical History  Procedure Laterality Date  . Rt shoulder repair  2009  . Thymus gland  2007  . Back csyt removed  1992  . Left knee arthroscopy  2001  . Circumcision  07/04/2011  Procedure: CIRCUMCISION ADULT;  Surgeon: Malka So, MD;  Location: [facility];  Service: Urology;  Laterality: N/A;  . Cardiac catheterization  >5 years.  . Umbilical hernia repair  03/14/2012    Procedure: HERNIA REPAIR UMBILICAL ADULT;  Surgeon: Haywood Lasso, MD;  Location: [place];  Service: General;  Laterality:  N/A;  . Insertion of mesh  03/14/2012    Procedure: INSERTION OF MESH;  Surgeon: Haywood Lasso, MD;  Location: [place];  Service: General;  Laterality: N/A;   Social History:  reports that he quit smoking about 32 years ago. His smoking use included Cigarettes. He has a 4 pack-year smoking history. He has never used smokeless tobacco. He reports that he does not drink alcohol or use illicit drugs.   Family History  Problem Relation Age of Onset  . Heart disease Mother   . Heart disease Father   . Cancer Maternal Uncle     Colon     Allergies  Allergen Reactions  . Advil [Ibuprofen] Hives  . Amoxicillin Swelling  . Morphine And Related Nausea And Vomiting      Prior to Admission medications   Medication Sig Start Date End Date Taking? Authorizing Provider  aspirin 81 MG tablet Take 81 mg by mouth every other day.   Yes Historical Provider, MD  Aspirin-Caffeine (ANACIN MAX STRENGTH) 500-32 MG TABS Take 1-2 tablets by mouth every 8 (eight) hours as needed (pain).   Yes Historical Provider, MD  cyclobenzaprine (FLEXERIL) 10 MG tablet Take 10 mg by mouth 3 (three) times daily as needed for muscle spasms (pain).   Yes Historical Provider, MD  HYDROcodone-acetaminophen (NORCO) 5-325 MG per tablet Take 1 tablet by mouth every 4 (four) hours as needed for pain. 03/14/12  Yes Haywood Lasso, MD  indomethacin (INDOCIN) 25 MG capsule Take 25 mg by mouth 4 (four) times daily.    Yes Historical Provider, MD  meclizine (ANTIVERT) 25 MG tablet Take 25 mg by mouth 3 (three) times daily as needed.    Yes Historical Provider, MD  naproxen sodium (ANAPROX) 220 MG tablet Take 220 mg by mouth 2 (two) times daily with a meal.   Yes Historical Provider, MD  olmesartan (BENICAR) 40 MG tablet Take 40 mg by mouth every morning.   Yes Historical Provider, MD    Review of Systems:  Constitutional:  No weight loss, night sweats, Fevers, chills, fatigue.  Head&Eyes: No headache.  No vision loss.  No  eye pain or scotoma ENT:  No Difficulty swallowing,Tooth/dental problems,Sore throat,   Cardio-vascular:  No Orthopnea, PND, swelling in lower extremities,  dizziness, palpitations  GI:  No  abdominal pain, nausea, vomiting, diarrhea, loss of appetite, hematochezia,  heartburn, indigestion, Resp:  No shortness of breath . No cough. No coughing up of blood .No wheezing.No chest wall deformity  Skin:  no rash or lesions.  GU:  no dysuria, change in color of urine, no urgency or frequency. No flank pain.  Musculoskeletal:  Multiple joint pain and swelling as above in HPI Psych:  No change in mood or affect. No depression or anxiety. Neurologic: No headache, no dysesthesia, no focal weakness, no vision loss. No syncope  Physical Exam: Filed Vitals:   02/25/13 1037 02/25/13 1157  BP: 139/74 131/63  Pulse: 98 100  Temp: 98.9 F (37.2 C)   TempSrc: Oral   Resp: 16 21  SpO2: 98% 96%   General:  A&O x 3, NAD, nontoxic, pleasant/cooperative Head/Eye: No  conjunctival hemorrhage, no icterus, [place]/AT, No nystagmus ENT:  No icterus,  No thrush, good dentition, no pharyngeal exudate Neck:  No masses, no lymphadenpathy, no bruits CV:  RRR, no rub, no gallop, no S3 Lung:  CTAB, good air movement, no wheeze, no rhonchi Abdomen: soft/NT, +BS, nondistended, no peritoneal signs Ext: No cyanosis, No rashes, No petechiae, No lymphangitis, 1+ LE edema; negative straight leg test; positive synovitis of his left knee, wrist, elbow, and bilateral ankles without any erythema or crepitance Neuro: CNII-XII intact, light touch sensation intact in the bilateral upper and lower extremities as well as his trunk.no dysmetria.   Labs on Admission:  Basic Metabolic Panel:  Recent Labs Lab 02/25/13 1142  NA 137  K 3.8  CL 95*  CO2 28  GLUCOSE 127*  BUN 17  CREATININE 1.25  CALCIUM 9.1   Liver Function Tests: No results found for this basename: AST, ALT, ALKPHOS, BILITOT, PROT, ALBUMIN,  in the last  168 hours No results found for this basename: LIPASE, AMYLASE,  in the last 168 hours No results found for this basename: AMMONIA,  in the last 168 hours CBC:  Recent Labs Lab 02/25/13 1142  WBC 16.0*  NEUTROABS 11.7*  HGB 8.1*  HCT 25.7*  MCV 78.6  PLT 381   Cardiac Enzymes:  Recent Labs Lab 02/25/13 1142  TROPONINI <0.30   BNP: No components found with this basename: POCBNP,  CBG: No results found for this basename: GLUCAP,  in the last 168 hours  Radiological Exams on Admission: Dg Chest 2 View  02/25/2013   CLINICAL DATA:  Chest pain.  Left arm and leg swelling.  EXAM: CHEST  2 VIEW  COMPARISON:  03/11/2012  FINDINGS: Artifact overlies the chest. There has been previous median sternotomy. The heart is enlarged. There may be mild basilar atelectasis. The upper lungs are clear. No apparent effusion.  IMPRESSION: Previous CABG.  Cardiomegaly.  Possible mild basilar atelectasis.   Electronically Signed   By: Nelson Chimes M.D.   On: 02/25/2013 12:42    EKG: Independently reviewed. pending    Time spent:70 minutes Code Status:   FULL Family Communication:   sister at bedside   Patsy Varma, DO  [facility] Pager [phone]  If 7PM-7AM, please contact night-coverage [email or web address] Password [email or web address] 02/25/2013, 1:49 PM

## 2013-02-25 NOTE — Progress Notes (Signed)
UR completed 

## 2013-02-25 NOTE — ED Provider Notes (Signed)
CSN: [id number]     Arrival date & time 02/25/13  1031 History   First MD Initiated Contact with Patient 02/25/13 1033     Chief Complaint  Patient presents with  . Chest Pain  . Hand Pain  . Leg Pain   (Consider location/radiation/quality/duration/timing/severity/associated sxs/prior Treatment) HPI Comments: 67 yo male with no cardiac hx, past smoker, HTN/ obesity/ FH cardiac presents with joint pains, back pain and chest pain.  Pt has had gradually worsening, intermittent left wrist/knee/ elbow and ankle bilateral pain similar to previous RA/ osteo hx.  Pt on NSAIDS but not helping. No fevers.  No injuries.  He also has had worsening left lower back pain with radiation down thigh, similar to previous sciatica, worse with movement.  No b/bladder changes, no obvious weakness. When trying to get up he developed 30 min anterior chest ache, no hx of similar, non radiating, resolved. Pt was exerting himself at the time.    Patient is a 67 y.o. male presenting with chest pain, hand pain, and leg pain. The history is provided by the patient.  Chest Pain Associated symptoms: back pain   Associated symptoms: no abdominal pain, no fever, no headache, no numbness, no shortness of breath, not vomiting and no weakness   Hand Pain Associated symptoms include chest pain. Pertinent negatives include no abdominal pain, no headaches and no shortness of breath.  Leg Pain Associated symptoms: back pain   Associated symptoms: no fever and no neck pain     Past Medical History  Diagnosis Date  . Hypertension   . Headache(784.0)   . Arthritis     gouty  . Vertigo 2012    takes "med" every other day  . Sore neck     d/t recent fall in tub  . Arrhythmia     pt doesnt know type  . Sleep apnea     cpap-setting 7-will bring mask on DOS- MD feels pt does not have after THymus removed  . Anxiety    Past Surgical History  Procedure Laterality Date  . Rt shoulder repair  2009  . Thymus gland  2007  .  Back csyt removed  1992  . Left knee arthroscopy  2001  . Circumcision  07/04/2011    Procedure: CIRCUMCISION ADULT;  Surgeon: Malka So, MD;  Location: [facility];  Service: Urology;  Laterality: N/A;  . Cardiac catheterization  >5 years.  . Umbilical hernia repair  03/14/2012    Procedure: HERNIA REPAIR UMBILICAL ADULT;  Surgeon: Haywood Lasso, MD;  Location: [place];  Service: General;  Laterality: N/A;  . Insertion of mesh  03/14/2012    Procedure: INSERTION OF MESH;  Surgeon: Haywood Lasso, MD;  Location: [facility];  Service: General;  Laterality: N/A;   Family History  Problem Relation Age of Onset  . Heart disease Mother   . Heart disease Father   . Cancer Maternal Uncle     Colon   History  Substance Use Topics  . Smoking status: Former Smoker -- 1.00 packs/day for 4 years    Types: Cigarettes    Quit date: 07/02/1980  . Smokeless tobacco: Never Used  . Alcohol Use: No    Review of Systems  Constitutional: Negative for fever and chills.  HENT: Negative for congestion.   Eyes: Negative for visual disturbance.  Respiratory: Negative for shortness of breath.   Cardiovascular: Positive for chest pain.  Gastrointestinal: Negative for vomiting and abdominal pain.  Genitourinary:  Negative for dysuria and flank pain.  Musculoskeletal: Positive for arthralgias, back pain and joint swelling. Negative for neck pain and neck stiffness.  Skin: Negative for rash.  Neurological: Negative for weakness, light-headedness, numbness and headaches.    Allergies  Advil; Amoxicillin; and Morphine and related  Home Medications   Current Outpatient Rx  Name  Route  Sig  Dispense  Refill  . aspirin 81 MG tablet   Oral   Take 81 mg by mouth every other day.         . Aspirin-Caffeine (ANACIN MAX STRENGTH) 500-32 MG TABS   Oral   Take 1-2 tablets by mouth every 8 (eight) hours as needed (pain).         . cyclobenzaprine (FLEXERIL) 10 MG tablet   Oral   Take  10 mg by mouth 3 (three) times daily as needed for muscle spasms (pain).         Marland Kitchen HYDROcodone-acetaminophen (NORCO) 5-325 MG per tablet   Oral   Take 1 tablet by mouth every 4 (four) hours as needed for pain.   30 tablet   0   . indomethacin (INDOCIN) 25 MG capsule   Oral   Take 25 mg by mouth 4 (four) times daily.          . meclizine (ANTIVERT) 25 MG tablet   Oral   Take 25 mg by mouth 3 (three) times daily as needed.          . naproxen sodium (ANAPROX) 220 MG tablet   Oral   Take 220 mg by mouth 2 (two) times daily with a meal.         . olmesartan (BENICAR) 40 MG tablet   Oral   Take 40 mg by mouth every morning.          BP 139/74  Pulse 98  Temp(Src) 98.9 F (37.2 C) (Oral)  Resp 16  SpO2 98% Physical Exam  Nursing note and vitals reviewed. Constitutional: He is oriented to person, place, and time. He appears well-developed and well-nourished.  HENT:  Head: Normocephalic and atraumatic.  Eyes: Conjunctivae are normal. Right eye exhibits no discharge. Left eye exhibits no discharge.  Neck: Normal range of motion. Neck supple. No tracheal deviation present.  Cardiovascular: Normal rate and regular rhythm.   Pulmonary/Chest: Effort normal and breath sounds normal.  Abdominal: Soft. He exhibits no distension. There is no tenderness. There is no guarding.  Musculoskeletal: He exhibits tenderness. He exhibits no edema.  Mild swelling left wrist, bilateral ankles. Tender to rom or left elbow, wrist, knees bilateral, ankles bilateral. No significant warmth or erythema to joints   Neurological: He is alert and oriented to person, place, and time. GCS eye subscore is 4. GCS verbal subscore is 5. GCS motor subscore is 6.  Reflex Scores:      Patellar reflexes are 1+ on the right side and 1+ on the left side.      Achilles reflexes are 1+ on the right side and 1+ on the left side. Difficult LE exam due to pain but no obvious weakness with F/ E of knees/ ankles/  great toe bilateral LE   Skin: Skin is warm. No rash noted.  Psychiatric: He has a normal mood and affect.    ED Course  Procedures (including critical care time) Labs Review Labs Reviewed  BASIC METABOLIC PANEL - Abnormal; Notable for the following:    Chloride 95 (*)    Glucose, Bld 127 (*)  GFR calc non Af Amer 58 (*)    GFR calc Af Amer 68 (*)    All other components within normal limits  CBC WITH DIFFERENTIAL - Abnormal; Notable for the following:    WBC 16.0 (*)    RBC 3.27 (*)    Hemoglobin 8.1 (*)    HCT 25.7 (*)    MCH 24.8 (*)    RDW 18.4 (*)    Neutro Abs 11.7 (*)    Monocytes Absolute 1.3 (*)    All other components within normal limits  OCCULT BLOOD, POC DEVICE - Abnormal; Notable for the following:    Fecal Occult Bld POSITIVE (*)    All other components within normal limits  CULTURE, BLOOD (ROUTINE X 2)  CULTURE, BLOOD (ROUTINE X 2)  BODY FLUID CULTURE  TROPONIN I  URINALYSIS, ROUTINE W REFLEX MICROSCOPIC  TROPONIN I  TROPONIN I  URIC ACID  IRON AND TIBC  FERRITIN  SYNOVIAL CELL COUNT + DIFF, W/ CRYSTALS   Imaging Review No results found.  EKG Interpretation   None       MDM   1. Chest pain   2. Acute blood loss anemia   3. Gout   4. Leukocytosis    Joint pains acute on chronic to his arthritis hx (gout, osteo, RA).  No signs of septic joint at this time however pt is at risk With his hx but he has mutiple joints involved and has fup with ortho. Chest pain exertional and with risk factors plan on work up in ED and observation for rule out.  Anemia found on labs, new.  Likely result from chronic nsaid use. No bleeding in ED.  Spoke with hopsitalist for admission, agreed.   The patients results and plan were reviewed and discussed.   Any x-rays performed were personally reviewed by myself.   Differential diagnosis were considered with the presenting HPI.  Admission/ observation were discussed with the admitting physician, patient  and/or family and they are comfortable with the plan.       Mariea Clonts, MD 02/25/13 [phone]

## 2013-02-25 NOTE — ED Notes (Signed)
Bed: [id number] Expected date:  Expected time:  Means of arrival:  Comments: Arm pain

## 2013-02-25 NOTE — Consult Note (Addendum)
[facility] Consult: 1:53 PM 02/25/2013  LOS: 0 days    Referring Provider: Dr Tat  Primary Care Physician:  Purvis Kilts, MD Primary Gastroenterologist:  Dr. Sydell Axon? Orth/Rheum:  Dr [name].    Reason for Consultation:  Anemia and black stool last week.    HPI: [name] is a 67 y.o. male.  Morbidly obese but not diabetic.  Gout which he treats with QID Indomethacin and Anacin 6 pills per day. Sleep apnea, does not use CPAP.  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 Screening colonoscopy >10 years ago was unremarkable.   Pt developed acute non CG and non-bloody emesis along with watery dark stools last week.  sxs began within one hour of eating a take-out rotisserie chicken on 12/24 and sxs lasted about 3 days.  Gout pain in feet and knees concomitantly flared to where he can not walk, so since 12/24 he has pretty much stayed in bed. Appetite back to normal and stools returned to normal: formed, [place].   In ED he is FOB +.  Hgb is 8.1 with baseline of 13.8 in 02/2012. MCV 78.  BUN is normal.  No ETOH, no other NSAIDs.  + fever and weakness last week but now resolved.   Had been advised by MD not to use Allopurinol because it was "bad for the liver".  However he has no history of abnormal LFTs.       Past Medical History  Diagnosis Date  . Hypertension   . Headache(784.0)   . Arthritis     gouty  . Vertigo 2012    takes "med" every other day  . Sore neck     d/t recent fall in tub  . Arrhythmia     pt doesnt know type  . Sleep apnea     cpap-setting 7-will bring mask on DOS- MD feels pt does not have after THymus removed  . Anxiety     Past Surgical History  Procedure Laterality Date  . Rt shoulder repair  2009  . Thymus gland  2007  . Back csyt removed  1992  .  Left knee arthroscopy  2001  . Circumcision  07/04/2011    Procedure: CIRCUMCISION ADULT;  Surgeon: Malka So, MD;  Location: [facility];  Service: Urology;  Laterality: N/A;  . Cardiac catheterization  >5 years.  . Umbilical hernia repair  03/14/2012    Procedure: HERNIA REPAIR UMBILICAL ADULT;  Surgeon: Haywood Lasso, MD;  Location: [place];  Service: General;  Laterality: N/A;  . Insertion of mesh  03/14/2012    Procedure: INSERTION OF MESH;  Surgeon: Haywood Lasso, MD;  Location: [place];  Service: General;  Laterality: N/A;    Prior to Admission medications   Medication Sig Start Date End Date Taking? Authorizing Provider  aspirin 81 MG tablet Take 81 mg by mouth every other day.   Yes Historical Provider, MD  Aspirin-Caffeine (ANACIN MAX STRENGTH) 500-32 MG TABS Take 1-2 tablets by mouth every 8 (eight) hours as needed (pain).  Yes Historical Provider, MD  cyclobenzaprine (FLEXERIL) 10 MG tablet Take 10 mg by mouth 3 (three) times daily as needed for muscle spasms (pain).   Yes Historical Provider, MD  HYDROcodone-acetaminophen (NORCO) 5-325 MG per tablet Take 1 tablet by mouth every 4 (four) hours as needed for pain. 03/14/12  Yes Haywood Lasso, MD  indomethacin (INDOCIN) 25 MG capsule Take 25 mg by mouth 4 (four) times daily.    Yes Historical Provider, MD  meclizine (ANTIVERT) 25 MG tablet Take 25 mg by mouth 3 (three) times daily as needed.    Yes Historical Provider, MD         olmesartan (BENICAR) 40 MG tablet Take 40 mg by mouth every morning.   Yes Historical Provider, MD    Scheduled Meds:  Infusions:  PRN Meds:      Allergies as of 02/25/2013 - Review Complete 02/25/2013  Allergen Reaction Noted  . Advil [ibuprofen] Hives 07/03/2011  . Amoxicillin Swelling 07/03/2011  . Morphine and related Nausea And Vomiting 07/03/2011    Family History  Problem Relation Age of Onset  . Heart disease Mother   . Heart disease Father   . Cancer  Maternal Uncle     Colon    History   Social History  . Marital Status: Widowed    Spouse Name: N/A    Number of Children: N/A  . Years of Education: N/A   Occupational History  . disabled    Social History Main Topics  . Smoking status: Former Smoker -- 1.00 packs/day for 4 years    Types: Cigarettes    Quit date: 07/02/1980  . Smokeless tobacco: Never Used  . Alcohol Use: No  . Drug Use: No  . Sexual Activity: Not on file   REVIEW OF SYSTEMS: Constitutional:  Stable weight ENT:  No nose bleeds Pulm:  Dyspnea, brief chest pain after falling while attempting to get out of bed today.  No syncope, no dizziness.  Not using CPAP.   CV:  No palpitations, no LE edema.  GU:  No hematuria, no frequency GI:  No dysphagia.  Good appetite Heme:  Never RXd iron supplements or told he had anemia   Transfusions:  None ever Neuro:  No headaches, no peripheral tingling or numbness Derm:  No itching, no rash or sores.  Endocrine:  No sweats or chills.  No polyuria or dysuria Immunization:  Flu and pnemovax orderedshot Travel:  None beyond local counties in last few months.    PHYSICAL EXAM: Vital signs in last 24 hours: Filed Vitals:   02/25/13 1157  BP: 131/63  Pulse: 100  Temp:   Resp: 21   Wt Readings from Last 3 Encounters:  06/17/12 150.866 kg (332 lb 9.6 oz)  03/29/12 144.244 kg (318 lb)  03/11/12 143.79 kg (317 lb)    General: morbidly obese, pale WM who looks chronically unwell. Alert, pleasant Head:  Cushingoid, no asymmetry or signs of trauma  Eyes:  No icterus or conj pallor Ears:  Not HOH  Nose:  No discharge Mouth:  Few teeeth remain, are in fair shape.  No lesions.  Moist oral MM Neck:  No mass, no JVD Lungs:  Clear bil.  No dyspnea or cough. Heart: RRR.  No MRG Abdomen:  Soft, obese, NT, hypoactive BS.   Rectal: not repeated.  FOBT + earlier by ED staff   Musc/Skeltl: feet, knees and ankle swollen but no pitting and no erythema Extremities:  +, not  pitting LE  edema  Neurologic:  Oriented x 3.  Good historian.  No tremor, no gross weakness or deficits Skin:  No rash, no sores, no purpura or bruises Tattoos:  none Nodes:  No cervical adenopathy.    Psych:  Pleasant, cooperative, engaged.   Intake/Output from previous day:   Intake/Output this shift:    LAB RESULTS:  Recent Labs  02/25/13 1142  WBC 16.0*  HGB 8.1*  HCT 25.7*  PLT 381  MCV    78  BMET Lab Results  Component Value Date   NA 137 02/25/2013   NA 142 03/11/2012   NA 141 07/04/2011   K 3.8 02/25/2013   K 4.5 03/11/2012   K 4.1 07/04/2011   CL 95* 02/25/2013   CL 102 03/11/2012   CL 103 09/24/2007   CO2 28 02/25/2013   CO2 28 03/11/2012   CO2 29 09/24/2007   GLUCOSE 127* 02/25/2013   GLUCOSE 108* 03/11/2012   GLUCOSE 130* 07/04/2011   BUN 17 02/25/2013   BUN 14 03/11/2012   BUN 12 09/24/2007   CREATININE 1.25 02/25/2013   CREATININE 1.22 03/11/2012   CREATININE 1.14 09/24/2007   CALCIUM 9.1 02/25/2013   CALCIUM 9.4 03/11/2012   CALCIUM 8.9 09/24/2007   LFT No results found for this basename: PROT, ALBUMIN, AST, ALT, ALKPHOS, BILITOT, BILIDIR, IBILI,  in the last 72 hours PT/INR Lab Results  Component Value Date   INR 0.9 09/24/2007      Component Value Date/Time   LIPASE 29 09/01/2007 0435    Dg Chest 2 View  02/25/2013   CLINICAL DATA:  Chest pain.  Left arm and leg swelling.  EXAM: CHEST  2 VIEW  COMPARISON:  03/11/2012  FINDINGS: Artifact overlies the chest. There has been previous median sternotomy. The heart is enlarged. There may be mild basilar atelectasis. The upper lungs are clear. No apparent effusion.  IMPRESSION: Previous CABG.  Cardiomegaly.  Possible mild basilar atelectasis.   Electronically Signed   By: Nelson Chimes M.D.   On: 02/25/2013 12:42   Note pt has had sternotomy but not CABG, I called Dr Maree Erie and he will adddend the CXR report.   ENDOSCOPIC STUDIES: Colonoscopy in [place] Screening study, unremarkable per pt.     IMPRESSION:   *  Anemia, borderline low MCV.  FOB + today, melenic tupe stool for 3 days along with non-bloody emesis last week which all resolved. Suspect gastritis/ulcers from significant chronic use of ASA and NSAIDs  *  Intense pain in feet, legs bil.  Suspicion is that this is gout. Managed at home only with ASA and NSAIDs  *  OSA.  His sleep pattern normalized post removal of thymous/goiter.  However note his cardiomegaly.     PLAN:     *  Note orders to start Protonix 40 Mg once daily.   *  Put orders in and scheduled EGD for 11:30 tomorrow. *  Note orders to start Lovenox, ASA 81 mg,  60 mg daily Prednisone. Might want to hold the *  Anemia studies ordered. CBC for AM.  *  Added PAS hose.  Pt at increased risk for DVT and we may need to stop Lovenox.  *  If this is gout, in future will need medical therapies such as colchicine and allopurinol to manage   Azucena Freed  02/25/2013, 1:53 PM Pager: [place] Attending  I have also seen and assessed the patient and agree with the above note.  Will evaluate anemia and suspected melena, nausea and vomiting with EGD tomorrow. The risks and benefits as well as alternatives of endoscopic procedure(s) have been discussed and reviewed. All questions answered. The patient agrees to proceed.  Check coags also  Gatha Mayer, MD, [facility] [phone] (pager) 02/25/2013 8:45 PM

## 2013-02-26 ENCOUNTER — Encounter (HOSPITAL_COMMUNITY)
Admission: AD | Disposition: A | Discharge status: Home Health | Payer: Self-pay | Source: Home / Self Care | Attending: Internal Medicine

## 2013-02-26 ENCOUNTER — Encounter (HOSPITAL_COMMUNITY): Payer: Self-pay

## 2013-02-26 ENCOUNTER — Inpatient Hospital Stay (HOSPITAL_COMMUNITY): Payer: Medicare Other

## 2013-02-26 DIAGNOSIS — K253 Acute gastric ulcer without hemorrhage or perforation: Secondary | ICD-10-CM | POA: Diagnosis present

## 2013-02-26 DIAGNOSIS — M130 Polyarthritis, unspecified: Secondary | ICD-10-CM

## 2013-02-26 DIAGNOSIS — M79609 Pain in unspecified limb: Secondary | ICD-10-CM

## 2013-02-26 HISTORY — DX: Acute gastric ulcer without hemorrhage or perforation: K25.3

## 2013-02-26 HISTORY — PX: ESOPHAGOGASTRODUODENOSCOPY: SHX5428

## 2013-02-26 LAB — BASIC METABOLIC PANEL
CO2: 27 mEq/L (ref 19–32)
Calcium: 8.6 mg/dL (ref 8.4–10.5)
GFR calc Af Amer: 50 mL/min — ABNORMAL LOW (ref >90)
GFR calc non Af Amer: 43 mL/min — ABNORMAL LOW (ref >90)
Potassium: 4.2 mEq/L (ref 3.7–5.3)
Sodium: 136 mEq/L — ABNORMAL LOW (ref 137–147)

## 2013-02-26 LAB — CBC
HCT: 25.7 % — ABNORMAL LOW (ref 39.0–52.0)
Hemoglobin: 7.8 g/dL — ABNORMAL LOW (ref 13.0–17.0)
MCH: 24.4 pg — ABNORMAL LOW (ref 26.0–34.0)
MCHC: 30.4 g/dL (ref 30.0–36.0)
MCV: 80.3 fL (ref 78.0–100.0)
Platelets: 386 10*3/uL (ref 150–400)
RBC: 3.2 MIL/uL — ABNORMAL LOW (ref 4.22–5.81)
WBC: 15.5 10*3/uL — ABNORMAL HIGH (ref 4.0–10.5)

## 2013-02-26 LAB — FERRITIN: Ferritin: 94 ng/mL (ref 22–322)

## 2013-02-26 LAB — SYNOVIAL CELL COUNT + DIFF, W/ CRYSTALS
Monocyte-Macrophage-Synovial Fluid: 3 % — ABNORMAL LOW (ref 50–90)
Neutrophil, Synovial: 92 % — ABNORMAL HIGH (ref 0–25)
WBC, Synovial: 31350 /mm3 — ABNORMAL HIGH (ref 0–200)

## 2013-02-26 LAB — PROTIME-INR
INR: 1.15 (ref 0.00–1.49)
Prothrombin Time: 14.5 seconds (ref 11.6–15.2)

## 2013-02-26 LAB — APTT: aPTT: 39 seconds — ABNORMAL HIGH (ref 24–37)

## 2013-02-26 SURGERY — EGD (ESOPHAGOGASTRODUODENOSCOPY)
Anesthesia: Moderate Sedation

## 2013-02-26 MED ORDER — SODIUM CHLORIDE 0.9 % IV SOLN
INTRAVENOUS | Status: DC
  Administered 2013-02-26 – 2013-02-27 (×3): via INTRAVENOUS

## 2013-02-26 MED ORDER — MIDAZOLAM HCL 10 MG/2ML IJ SOLN
INTRAMUSCULAR | Status: DC | PRN
  Administered 2013-02-26 (×2): 2 mg via INTRAVENOUS
  Administered 2013-02-26: 1 mg via INTRAVENOUS

## 2013-02-26 MED ORDER — FENTANYL CITRATE 0.05 MG/ML IJ SOLN
INTRAMUSCULAR | Status: AC
  Filled 2013-02-26: qty 2

## 2013-02-26 MED ORDER — SODIUM CHLORIDE 0.9 % IV SOLN
125.0000 mg | Freq: Once | INTRAVENOUS | Status: AC
  Administered 2013-02-26: 125 mg via INTRAVENOUS
  Filled 2013-02-26: qty 10

## 2013-02-26 MED ORDER — FENTANYL CITRATE 0.05 MG/ML IJ SOLN
INTRAMUSCULAR | Status: DC | PRN
  Administered 2013-02-26 (×2): 25 ug via INTRAVENOUS

## 2013-02-26 MED ORDER — BUTAMBEN-TETRACAINE-BENZOCAINE 2-2-14 % EX AERO
INHALATION_SPRAY | CUTANEOUS | Status: DC | PRN
  Administered 2013-02-26: 2 via TOPICAL

## 2013-02-26 MED ORDER — DIPHENHYDRAMINE HCL 50 MG/ML IJ SOLN
INTRAMUSCULAR | Status: AC
  Filled 2013-02-26: qty 1

## 2013-02-26 MED ORDER — FERROUS SULFATE 325 (65 FE) MG PO TABS
325.0000 mg | ORAL_TABLET | Freq: Two times a day (BID) | ORAL | Status: DC
  Administered 2013-02-26 – 2013-02-28 (×4): 325 mg via ORAL
  Filled 2013-02-26 (×6): qty 1

## 2013-02-26 MED ORDER — MIDAZOLAM HCL 10 MG/2ML IJ SOLN
INTRAMUSCULAR | Status: AC
  Filled 2013-02-26: qty 2

## 2013-02-26 MED ORDER — ENOXAPARIN SODIUM 80 MG/0.8ML ~~LOC~~ SOLN
70.0000 mg | SUBCUTANEOUS | Status: DC
  Administered 2013-02-26 – 2013-02-27 (×2): 70 mg via SUBCUTANEOUS
  Filled 2013-02-26 (×3): qty 0.8

## 2013-02-26 MED ORDER — IOHEXOL 300 MG/ML  SOLN
10.0000 mL | Freq: Once | INTRAMUSCULAR | Status: AC | PRN
  Administered 2013-02-26: 15:00:00 10 mL via INTRAVENOUS

## 2013-02-26 NOTE — Op Note (Signed)
[facility] [place], [id number]   ENDOSCOPY PROCEDURE REPORT  PATIENT: Bruce, Conley  MR#: [id number] BIRTHDATE: 02-09-1946 , 40  yrs. old GENDER: Male ENDOSCOPIST: Gatha Mayer, MD, [facility] PROCEDURE DATE:  02/26/2013 PROCEDURE:  EGD w/ biopsy ASA CLASS:     Class III INDICATIONS:  Vomiting. MEDICATIONS: Fentanyl 50 mcg IV and Versed 5 mg IV TOPICAL ANESTHETIC: Cetacaine Spray  DESCRIPTION OF PROCEDURE: After the risks benefits and alternatives of the procedure were thoroughly explained, informed consent was obtained.  The [place] [id number] endoscope was introduced through the mouth and advanced to the second portion of the duodenum. Without limitations.  The instrument was slowly withdrawn as the mucosa was fully examined.        STOMACH: A small non-bleeding round and clean-based ulcer was found in the gastric antrum.  Biopsies were taken at edge of the ulcer and at the center of the ulcer.  The remainder of the upper endoscopy exam was otherwise normal. Retroflexed views revealed no abnormalities.     The scope was then withdrawn from the patient and the procedure completed.  COMPLICATIONS: There were no complications. ENDOSCOPIC IMPRESSION: 1.   Small non-bleeding ulcer was found in the gastric antrum - biopised 2.   The remainder of the upper endoscopy exam was otherwise normal  RECOMMENDATIONS: 1.  Await biopsy results 2.  Continue PPI    eSigned:  Gatha Mayer, MD, [facility] 02/26/2013 12:26 PM

## 2013-02-26 NOTE — Progress Notes (Signed)
[facility] PROGRESS NOTE  [name] [id number] DOB: 03/08/1945 DOA: 02/25/2013 PCP: Purvis Kilts, MD  Assessment/Plan: polyarthritis/synovitis  -His clinical symptoms most likely represent acute gouty arthritis and correlate with his recent running out of indomethacin  -of concern, septic arthritis is still possibility but less likely -Request IR for arthrocentesis of left knee  -send synovial fluid for cell count, crystal analysis, Gram stain and culture  -Empirically start the patient on prednisone 60 mg daily  -uric acid--8.1  -X-ray left knee, left wrist, elbow, shoulder-- no fracture or dislocations -Physical therapy evaluation  -venous duplex legs r/o DVT Anemia with positive FOBT  -concerned about acute blood loss anemia  -Patient has chronic use of indomethacin with melena  -Appreciate GI -02/26/13 EGD--> small nonbleeding gastric antral ulcer -Hemoglobin has dropped from 13.8-8.1 in the past 11 months  -Iron saturation less than 10%--> give a dose of IV iron, start ferrous sulfate -Start PPI  Atypical chest pain  -Likely musculoskeletal  -Cycle troponins--negative x3 -Obtain EKG  Hypoxemia -repeat CXR CKD stage II  -Baseline creatinine 1.1-1.2  -Judicious IV fluids  Leukocytosis  -Likely due to acute medical condition and gouty arthritis flare  -Continue trending  -Afebrile and hemodynamically stable at present  -UA-negative for pyuria -Chest x-ray without infiltrate  Hyperension  -d/c ARB as creatinine has climbed today to 1.6 -increase IVF   Family Communication:   Pt at beside Disposition Plan:   Home when medically stable      Procedures/Studies: Dg Chest 2 View  02/25/2013   ADDENDUM REPORT: 02/25/2013 14:33  ADDENDUM: 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 This does not relate to CABG.   Electronically Signed   By: Nelson Chimes M.D.   On: 02/25/2013 14:33   02/25/2013   CLINICAL DATA:  Chest pain.  Left arm and  leg swelling.  EXAM: CHEST  2 VIEW  COMPARISON:  03/11/2012  FINDINGS: Artifact overlies the chest. There has been previous median sternotomy. The heart is enlarged. There may be mild basilar atelectasis. The upper lungs are clear. No apparent effusion.  IMPRESSION: Previous CABG.  Cardiomegaly.  Possible mild basilar atelectasis.  Electronically Signed: By: Nelson Chimes M.D. On: 02/25/2013 12:42   Dg Elbow 2 Views Left  02/25/2013   CLINICAL DATA:  Elbow joint pain. Limited range of motion. No prior injury.  EXAM: LEFT ELBOW - 2 VIEW  COMPARISON:  None.  FINDINGS: The left elbow demonstrates no fracture or dislocation. There is no significant joint effusion. The soft tissues are normal.  IMPRESSION: No acute osseous injury of the left elbow.   Electronically Signed   By: Kathreen Devoid   On: 02/25/2013 17:00   Dg Wrist 2 Views Left  02/25/2013   CLINICAL DATA:  Wrist pain  EXAM: LEFT WRIST - 2 VIEW  COMPARISON:  None.  FINDINGS: There is no acute fracture or dislocation. There are severe degenerative changes of the [facility]st [facility] joint. The soft tissues are normal.  IMPRESSION: No acute osseous injury of the left wrist.   Electronically Signed   By: Kathreen Devoid   On: 02/25/2013 16:59   Dg Knee 1-2 Views Left  02/25/2013   CLINICAL DATA:  Knee pain and edema.  No injury.  EXAM: LEFT KNEE - 1-2 VIEW  COMPARISON:  None.  FINDINGS: Exam demonstrates moderate tricompartmental osteoarthritic change. There is a suggestion of a small joint effusion. There is no fracture dislocation.  IMPRESSION: Moderate osteoarthritic change and possible small joint  effusion.   Electronically Signed   By: Marin Olp M.D.   On: 02/25/2013 17:00   Dg Shoulder Left  02/25/2013   CLINICAL DATA:  Anterior and superior shoulder pain. Unable to abduct.  EXAM: LEFT SHOULDER - 2+ VIEW  COMPARISON:  None.  FINDINGS: Evaluation is limited secondary to body habitus. There is no fracture or dislocation. The acromioclavicular joint is  unremarkable.  IMPRESSION: No acute osseous injury of the left shoulder. Evaluation limited secondary to body habitus.   Electronically Signed   By: Kathreen Devoid   On: 02/25/2013 17:01         Subjective: Patient states that his left knee and left arm pain has not improved. He denies fevers, chills, chest discomfort, nausea, vomiting, diarrhea, abdominal pain, dysuria, hematuria.  Objective: Filed Vitals:   02/26/13 1210 02/26/13 1220 02/26/13 1230 02/26/13 1240  BP: 141/58 139/62 143/69 129/72  Pulse: 71 76 77 81  Temp:    98.1 F (36.7 C)  TempSrc:    Oral  Resp: 20 19 20 20   Height:      Weight:      SpO2: 96% 95% 92% 95%    Intake/Output Summary (Last 24 hours) at 02/26/13 1330 Last data filed at 02/26/13 1011  Gross per 24 hour  Intake   1560 ml  Output    950 ml  Net    610 ml   Weight change:  Exam:   General:  Pt is alert, follows commands appropriately, not in acute distress  HEENT: No icterus, No thrush, No neck mass, [place]/AT  Cardiovascular: RRR, S1/S2, no rubs, no gallops  Respiratory: CTA bilaterally, no wheezing, no crackles, no rhonchi  Abdomen: Soft/+BS, non tender, non distended, no guarding  Extremities: No edema, No lymphangitis, No petechiae, No rashes, no synovitis  Data Reviewed: Basic Metabolic Panel:  Recent Labs Lab 02/25/13 1142 02/26/13 0454  NA 137 136*  K 3.8 4.2  CL 95* 96  CO2 28 27  GLUCOSE 127* 122*  BUN 17 28*  CREATININE 1.25 1.60*  CALCIUM 9.1 8.6   Liver Function Tests: No results found for this basename: AST, ALT, ALKPHOS, BILITOT, PROT, ALBUMIN,  in the last 168 hours No results found for this basename: LIPASE, AMYLASE,  in the last 168 hours No results found for this basename: AMMONIA,  in the last 168 hours CBC:  Recent Labs Lab 02/25/13 1142 02/26/13 0454  WBC 16.0* 15.5*  NEUTROABS 11.7*  --   HGB 8.1* 7.8*  HCT 25.7* 25.7*  MCV 78.6 80.3  PLT 381 386   Cardiac Enzymes:  Recent Labs Lab  02/25/13 1142 02/25/13 1833 02/26/13 0100  TROPONINI <0.30 <0.30 <0.30   BNP: No components found with this basename: POCBNP,  CBG: No results found for this basename: GLUCAP,  in the last 168 hours  Recent Results (from the past 240 hour(s))  CULTURE, BLOOD (ROUTINE X 2)     Status: None   Collection Time    02/25/13  4:45 PM      Result Value Range Status   Specimen Description BLOOD RIGHT ARM   Final   Special Requests BOTTLES DRAWN AEROBIC AND ANAEROBIC 10CC   Final   Culture  Setup Time     Final   Value: 02/25/2013 22:48     Performed at [place] [place]     Final   Value:        BLOOD CULTURE RECEIVED NO GROWTH TO DATE  CULTURE WILL BE HELD FOR 5 DAYS BEFORE ISSUING A FINAL NEGATIVE REPORT     Performed at [facility]   Report Status PENDING   Incomplete  CULTURE, BLOOD (ROUTINE X 2)     Status: None   Collection Time    02/25/13  4:50 PM      Result Value Range Status   Specimen Description BLOOD RIGHT HAND   Final   Special Requests BOTTLES DRAWN AEROBIC AND ANAEROBIC 10CC   Final   Culture  Setup Time     Final   Value: 02/25/2013 22:48     Performed at [facility]   Culture     Final   Value:        BLOOD CULTURE RECEIVED NO GROWTH TO DATE CULTURE WILL BE HELD FOR 5 DAYS BEFORE ISSUING A FINAL NEGATIVE REPORT     Performed at [facility]   Report Status PENDING   Incomplete     Scheduled Meds: . aspirin EC  81 mg Oral Q48H  . enoxaparin (LOVENOX) injection  75 mg Subcutaneous Q24H  . pantoprazole  40 mg Oral Daily  . predniSONE  60 mg Oral Q breakfast  . sodium chloride  3 mL Intravenous Q12H   Continuous Infusions: . sodium chloride 0.9 % 1,000 mL with potassium chloride 20 mEq infusion 75 mL/hr at 02/26/13 0522     Camerin Ladouceur, DO  [facility] Pager [phone]  If 7PM-7AM, please contact night-coverage [email or web address] Password [email or web address] 02/26/2013, 1:30 PM   LOS: 1 day

## 2013-02-26 NOTE — Progress Notes (Signed)
Bilateral lower extremity venous duplex:  No evidence of DVT, superficial thrombosis, or Baker's Cyst.   

## 2013-02-26 NOTE — Progress Notes (Signed)
Pt alert with MD present and will sign consent for left knee aspiration. Alerted radiology to consent being signed.

## 2013-02-26 NOTE — Progress Notes (Signed)
PT Cancellation Note  Patient Details Name: [name] MRN: [id number] DOB: 11/14/1945   Cancelled Treatment:    Reason Eval/Treat Not Completed: Patient at procedure or test/unavailable . Also noted Hgb to be 7.8. Will hold PT today and check back on tomorrow. Thanks.    Weston Anna, MPT Pager: [phone]

## 2013-02-26 NOTE — Progress Notes (Signed)
Writer received call from "Joy" in radiology regarding left knee aspiration of pt. Radiology stating that pt too sedated to sign consent from Endoscopy procedure this morning. Left knee aspiration will not be done. Writer will alert MD.

## 2013-02-26 NOTE — Care Management Note (Addendum)
    Page 1 of 1   02/28/2013     11:56:07 AM   CARE MANAGEMENT NOTE 02/28/2013  Patient:  [name], [name]   Account Number:  [id number]  Date Initiated:  02/26/2013  Documentation initiated by:  Dessa Phi  Subjective/Objective Assessment:   67 Y/O M ADMITTED W/CHEST PAIN,ANEMIA,BLACK STOOLS.     Action/Plan:   FROM HOME.   Anticipated DC Date:  02/28/2013   Anticipated DC Plan:  [place]  CM consult      Choice offered to / List presented to:  C-1 Patient           Status of service:  In process, will continue to follow Medicare Important Message given?   (If response is "NO", the following Medicare IM given date fields will be blank) Date Medicare IM given:   Date Additional Medicare IM given:    Discharge Disposition:    Per UR Regulation:  Reviewed for med. necessity/level of care/duration of stay  If discussed at [place] of Stay Meetings, dates discussed:    Comments:  02/29/12 Anjelo Pullman RN,BSN NCM [phone] DECLINES SNF.WANT HOME [place].RECEIVED ORDERS FOR HHPT/OT.HE WILL STAY W/SISTER DOESN'T KNOW HER ADDRESS.NURSE TO CALL ME WHEN SISTER COMES TO OFFER [place].RECOMMENDED FOR HHPT/OT.PAGED PT FOR CONFIRM RW W/LUE PLATFORM OR PLAIN RW SINCE PATIENT STATES HIS L ARM IS FEELING BETTER.  02/26/13 Ulises Wolfinger RN,BSN NCM [phone]

## 2013-02-27 LAB — CBC
HEMATOCRIT: 27 % — AB (ref 39.0–52.0)
Hemoglobin: 8.5 g/dL — ABNORMAL LOW (ref 13.0–17.0)
MCH: 25.1 pg — ABNORMAL LOW (ref 26.0–34.0)
MCHC: 31.5 g/dL (ref 30.0–36.0)
MCV: 79.6 fL (ref 78.0–100.0)
Platelets: 453 10*3/uL — ABNORMAL HIGH (ref 150–400)
RBC: 3.39 MIL/uL — ABNORMAL LOW (ref 4.22–5.81)
RDW: 18.1 % — ABNORMAL HIGH (ref 11.5–15.5)
WBC: 16.3 10*3/uL — ABNORMAL HIGH (ref 4.0–10.5)

## 2013-02-27 MED ORDER — METHYLPREDNISOLONE SODIUM SUCC 125 MG IJ SOLR
60.0000 mg | Freq: Two times a day (BID) | INTRAMUSCULAR | Status: DC
  Administered 2013-02-27 – 2013-02-28 (×2): 60 mg via INTRAVENOUS
  Filled 2013-02-27 (×4): qty 0.96

## 2013-02-27 MED ORDER — SENNA 8.6 MG PO TABS
2.0000 | ORAL_TABLET | Freq: Every day | ORAL | Status: DC
  Administered 2013-02-27 – 2013-02-28 (×2): 17.2 mg via ORAL
  Filled 2013-02-27 (×2): qty 2

## 2013-02-27 MED ORDER — HYDROCODONE-ACETAMINOPHEN 5-325 MG PO TABS
2.0000 | ORAL_TABLET | ORAL | Status: DC | PRN
  Administered 2013-02-27 – 2013-02-28 (×2): 2 via ORAL
  Filled 2013-02-27 (×2): qty 2

## 2013-02-27 MED ORDER — DOCUSATE SODIUM 100 MG PO CAPS
100.0000 mg | ORAL_CAPSULE | Freq: Two times a day (BID) | ORAL | Status: DC
  Administered 2013-02-27 – 2013-02-28 (×2): 100 mg via ORAL
  Filled 2013-02-27 (×3): qty 1

## 2013-02-27 NOTE — Progress Notes (Signed)
    Progress Note   Subjective  no abdominal pain. No bleeding    Objective   Vital signs in last 24 hours: Temp:  [97.2 F (36.2 C)-98.5 F (36.9 C)] 97.2 F (36.2 C) (01/01 0515) Pulse Rate:  [71-86] 78 (01/01 0515) Resp:  [14-25] 20 (01/01 0515) BP: (123-173)/(58-80) 136/73 mmHg (01/01 0515) SpO2:  [85 %-98 %] 97 % (01/01 0515) Last BM Date: 02/22/13 General:    white male in NAD Lungs: Respirations even and unlabored Abdomen:  Soft,obese, nontender. Normal bowel sounds. Neurologic:  Alert and oriented,  grossly normal neurologically. Psych:  Cooperative. Normal mood and affect.  Lab Results:  Recent Labs  02/25/13 1142 02/26/13 0454  WBC 16.0* 15.5*  HGB 8.1* 7.8*  HCT 25.7* 25.7*  PLT 381 386   BMET  Recent Labs  02/25/13 1142 02/26/13 0454  NA 137 136*  K 3.8 4.2  CL 95* 96  CO2 28 27  GLUCOSE 127* 122*  BUN 17 28*  CREATININE 1.25 1.60*  CALCIUM 9.1 8.6     Assessment / Plan:   1. Upper GI bleed. Small clean based gastric ulcer on EGD yesterday. He takes NSAIDs at home. Biopsy pending. Continue PPI.   2. Anemia of acute blood loss. Baseline hgb around 13-14, down to 7.8 yesterday. He doesn't feel dizzy or SOB. Will check CBC today to make sure hgb stable. He got a dose of IV iron. Should be discharged on oral iron as well.     LOS: 2 days   Tye Savoy  02/27/2013, 10:20 AM   [place] [facility] Attending  I agree with the above note. Gatha Mayer, MD, [facility] [phone] (pager) 02/27/2013 4:56 PM

## 2013-02-27 NOTE — Progress Notes (Signed)
[facility] PROGRESS NOTE  KAILUB YOPP [id number] DOB: [birth year] DOA: 02/25/2013 PCP: Purvis Kilts, MD  Assessment/Plan: polyarthritis/synovitis  -His clinical symptoms most likely represent acute gouty arthritis and correlate with his recent running out of indomethacin  -cell count does not suggests septic arthritis -+urate crystals on arthrocentesis -IR for arthrocentesis left knee -02/26/13 -send synovial fluid for cell count, crystal analysis, Gram stain and culture  -Discontinue prednisone -Start Solu-Medrol 60 mg IV every 12 hours as the patient's pain has not significantly improved with prednisone -uric acid--8.1  -X-ray left knee, left wrist, elbow, shoulder-- no fracture or dislocations  -Physical therapy evaluation  -venous duplex legs--neg for DVT Anemia with positive FOBT  -Hgb remains stable -Patient has chronic use of indomethacin with melena  -Appreciate GI  -02/26/13 EGD--> small nonbleeding gastric antral ulcer  -Hemoglobin has dropped from 13.8-8.1 in the past 11 months  -Iron saturation less than 10%--> give a dose of IV iron, start ferrous sulfate  -Start PPI -Avoid NSAIDs  Atypical chest pain  -Likely musculoskeletal  -Cycle troponins--negative x3   Hypoxemia  -likely due to hypoventilation due to body habitus -repeat CXR--neg for edema or infiltrate CKD stage II  -Baseline creatinine 1.1-1.2  -Judicious IV fluids  Leukocytosis  -Likely due to acute medical condition and gouty arthritis flare  -Continue trending  -Afebrile and hemodynamically stable at present  -UA-negative for pyuria  -Chest x-ray without infiltrate  Hypertension  -controlled -d/c ARB as creatinine has climbed today to 1.6  -increase IVF Constipation -Start cathartic  Family Communication:   Pt at beside Disposition Plan:   SNF       Procedures/Studies: Dg Chest 2 View  02/25/2013   ADDENDUM REPORT: 02/25/2013 14:33  ADDENDUM: 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 This does not relate to CABG.   Electronically Signed   By: Nelson Chimes M.D.   On: 02/25/2013 14:33   02/25/2013   CLINICAL DATA:  Chest pain.  Left arm and leg swelling.  EXAM: CHEST  2 VIEW  COMPARISON:  03/11/2012  FINDINGS: Artifact overlies the chest. There has been previous median sternotomy. The heart is enlarged. There may be mild basilar atelectasis. The upper lungs are clear. No apparent effusion.  IMPRESSION: Previous CABG.  Cardiomegaly.  Possible mild basilar atelectasis.  Electronically Signed: By: Nelson Chimes M.D. On: 02/25/2013 12:42   Dg Elbow 2 Views Left  02/25/2013   CLINICAL DATA:  Elbow joint pain. Limited range of motion. No prior injury.  EXAM: LEFT ELBOW - 2 VIEW  COMPARISON:  None.  FINDINGS: The left elbow demonstrates no fracture or dislocation. There is no significant joint effusion. The soft tissues are normal.  IMPRESSION: No acute osseous injury of the left elbow.   Electronically Signed   By: Kathreen Devoid   On: 02/25/2013 17:00   Dg Wrist 2 Views Left  02/25/2013   CLINICAL DATA:  Wrist pain  EXAM: LEFT WRIST - 2 VIEW  COMPARISON:  None.  FINDINGS: There is no acute fracture or dislocation. There are severe degenerative changes of the [facility] joint. The soft tissues are normal.  IMPRESSION: No acute osseous injury of the left wrist.   Electronically Signed   By: Kathreen Devoid   On: 02/25/2013 16:59   Dg Knee 1-2 Views Left  02/25/2013   CLINICAL DATA:  Knee pain and edema.  No injury.  EXAM: LEFT KNEE - 1-2 VIEW  COMPARISON:  None.  FINDINGS: Exam demonstrates moderate  tricompartmental osteoarthritic change. There is a suggestion of a small joint effusion. There is no fracture dislocation.  IMPRESSION: Moderate osteoarthritic change and possible small joint effusion.   Electronically Signed   By: Marin Olp M.D.   On: 02/25/2013 17:00   Dg Chest Port 1 View  02/26/2013   CLINICAL DATA:  Shortness breath. Chest pain. Hypoxia. History  of high blood pressure.  EXAM: PORTABLE CHEST - 1 VIEW  COMPARISON:  02/25/2013.  FINDINGS: Prior median sternotomy.  Cardiomegaly.  Limited evaluation of the mediastinum on this AP view.  No pulmonary edema, gross pneumothorax or segmental infiltrate.  Evaluation of lung bases limited by slightly elevated right hemidiaphragm and cardiomegaly.  IMPRESSION: Cardiomegaly.  Please see above.   Electronically Signed   By: Chauncey Cruel M.D.   On: 02/26/2013 15:44   Dg Shoulder Left  02/25/2013   CLINICAL DATA:  Anterior and superior shoulder pain. Unable to abduct.  EXAM: LEFT SHOULDER - 2+ VIEW  COMPARISON:  None.  FINDINGS: Evaluation is limited secondary to body habitus. There is no fracture or dislocation. The acromioclavicular joint is unremarkable.  IMPRESSION: No acute osseous injury of the left shoulder. Evaluation limited secondary to body habitus.   Electronically Signed   By: Kathreen Devoid   On: 02/25/2013 17:01   Dg Fluoro Guide Ndl Plc/bx  02/26/2013   CLINICAL DATA:  Left knee pain.  EXAM: Left KNEE INJECTION UNDER FLUOROSCOPY  FLUOROSCOPY TIME:  25 seconds  PROCEDURE: Overlying skin prepped with Betadine, draped in the usual sterile fashion, and infiltrated locally with buffered Lidocaine. A 20 gauge spinal needle advanced to the left knee joint space. Diagnostic injection of 1-2 mL Omnipaque 300 demonstrates intra-articular spread without intravascular component.  11 mL of yellow thick viscous, turbid synovial fluid was aspirated from the left knee joint.  The needle was removed. There were no immediate complications. The patient tolerated the procedure well.  IMPRESSION: Technically successful left knee aspiration under fluoroscopy.  The aspirate was thick, viscous, turbid synovial fluid concerning for infection. The fluid was sent for laboratory analysis, gram stain and sensitivities.   Electronically Signed   By: Kathreen Devoid   On: 02/26/2013 15:46         Subjective: Patient states  that the left knee joint pain was only 10% better. Denies fevers, chills, chest discomfort, shortness of breath, nausea, vomiting, diarrhea, abdominal pain. Left arm pain is only 10% better. Has difficulty with physical therapy  Objective: Filed Vitals:   02/26/13 1536 02/26/13 2222 02/27/13 0515 02/27/13 1417  BP: 124/69 123/66 136/73 135/69  Pulse: 84 84 78 88  Temp: 98.2 F (36.8 C) 98 F (36.7 C) 97.2 F (36.2 C) 97.4 F (36.3 C)  TempSrc: Oral Oral Oral Oral  Resp: 18 20 20 20   Height:      Weight:      SpO2: 93% 98% 97% 98%    Intake/Output Summary (Last 24 hours) at 02/27/13 1645 Last data filed at 02/27/13 1249  Gross per 24 hour  Intake   2420 ml  Output   1950 ml  Net    470 ml   Weight change:  Exam:   General:  Pt is alert, follows commands appropriately, not in acute distress  HEENT: No icterus, No thrush,  [place]/AT  Cardiovascular: RRR, S1/S2, no rubs, no gallops  Respiratory: CTA bilaterally, no wheezing, no crackles, no rhonchi  Abdomen: Soft/+BS, non tender, non distended, no guarding Extremities: 1+ LE edema; negative straight  leg test; positive synovitis of his left knee, wrist, elbow, and bilateral ankles without any erythema or crepitance    Data Reviewed: Basic Metabolic Panel:  Recent Labs Lab 02/25/13 1142 02/26/13 0454  NA 137 136*  K 3.8 4.2  CL 95* 96  CO2 28 27  GLUCOSE 127* 122*  BUN 17 28*  CREATININE 1.25 1.60*  CALCIUM 9.1 8.6   Liver Function Tests: No results found for this basename: AST, ALT, ALKPHOS, BILITOT, PROT, ALBUMIN,  in the last 168 hours No results found for this basename: LIPASE, AMYLASE,  in the last 168 hours No results found for this basename: AMMONIA,  in the last 168 hours CBC:  Recent Labs Lab 02/25/13 1142 02/26/13 0454 02/27/13 1127  WBC 16.0* 15.5* 16.3*  NEUTROABS 11.7*  --   --   HGB 8.1* 7.8* 8.5*  HCT 25.7* 25.7* 27.0*  MCV 78.6 80.3 79.6  PLT 381 386 453*   Cardiac Enzymes:  Recent  Labs Lab 02/25/13 1142 02/25/13 1833 02/26/13 0100  TROPONINI <0.30 <0.30 <0.30   BNP: No components found with this basename: POCBNP,  CBG: No results found for this basename: GLUCAP,  in the last 168 hours  Recent Results (from the past 240 hour(s))  CULTURE, BLOOD (ROUTINE X 2)     Status: None   Collection Time    02/25/13  4:45 PM      Result Value Range Status   Specimen Description BLOOD RIGHT ARM   Final   Special Requests BOTTLES DRAWN AEROBIC AND ANAEROBIC 10CC   Final   Culture  Setup Time     Final   Value: 02/25/2013 22:48     Performed at [facility]   Culture     Final   Value:        BLOOD CULTURE RECEIVED NO GROWTH TO DATE CULTURE WILL BE HELD FOR 5 DAYS BEFORE ISSUING A FINAL NEGATIVE REPORT     Performed at [facility]   Report Status PENDING   Incomplete  CULTURE, BLOOD (ROUTINE X 2)     Status: None   Collection Time    02/25/13  4:50 PM      Result Value Range Status   Specimen Description BLOOD RIGHT HAND   Final   Special Requests BOTTLES DRAWN AEROBIC AND ANAEROBIC 10CC   Final   Culture  Setup Time     Final   Value: 02/25/2013 22:48     Performed at [facility]   Culture     Final   Value:        BLOOD CULTURE RECEIVED NO GROWTH TO DATE CULTURE WILL BE HELD FOR 5 DAYS BEFORE ISSUING A FINAL NEGATIVE REPORT     Performed at [facility]   Report Status PENDING   Incomplete  BODY FLUID CULTURE     Status: None   Collection Time    02/26/13  3:00 PM      Result Value Range Status   Specimen Description SYNOVIAL   Final   Special Requests NONE LEFT KNEE   Final   Gram Stain     Final   Value: WBC PRESENT, PREDOMINANTLY PMN     NO ORGANISMS SEEN     Performed at [facility]   Culture     Final   Value: NO GROWTH 1 DAY     Performed at [facility]   Report Status PENDING   Incomplete  Scheduled Meds: . aspirin EC  81 mg Oral Q48H  . enoxaparin (LOVENOX) injection  70 mg Subcutaneous  Q24H  . ferrous sulfate  325 mg Oral BID WC  . pantoprazole  40 mg Oral Daily  . predniSONE  60 mg Oral Q breakfast  . sodium chloride  3 mL Intravenous Q12H   Continuous Infusions: . sodium chloride 100 mL/hr at 02/27/13 0231     Quamir Willemsen, DO  [facility] Pager [phone]  If 7PM-7AM, please contact night-coverage [email or web address] Password [email or web address] 02/27/2013, 4:45 PM   LOS: 2 days

## 2013-02-27 NOTE — Evaluation (Signed)
Physical Therapy Evaluation Patient Details Name: [name] MRN: [id number] DOB: December 02, 1945 Today's Date: 02/27/2013 Time: ID:[id number] PT Time Calculation (min): 18 min  PT Assessment / Plan / Recommendation History of Present Illness  66 to male admitted with chst pain, polyarthritis/synovitis, gout flare?, fall. Hx of gout, HTN, vertigo, sleep apnea. Pt lives alone.   Clinical Impression  On eval, pt required +2 assist for bed mobility, standing, and attempt at ambulation. Pt unable to ambulate at this time. Mobility is significantly limited by pain. At this time, recommend SNF for continued rehab. Pt lives alone-not able to return home requiring this level of care.     PT Assessment  Patient needs continued PT services    Follow Up Recommendations  SNF    Does the patient have the potential to tolerate intense rehabilitation      Barriers to Discharge        Equipment Recommendations  Rolling walker with 5" wheels (wide/bariatric. May need L UE platform-to be determined)    Recommendations for Other Services OT consult   Frequency Min 3X/week    Precautions / Restrictions Precautions Precautions: Fall Restrictions Weight Bearing Restrictions: No   Pertinent Vitals/Pain 8/10 L UE, L LE, chest with activity.       Mobility  Bed Mobility Bed Mobility: Supine to Sit;Sit to Supine Supine to Sit: 1: +2 Total assist Supine to Sit: Patient Percentage: 60% Sit to Supine: 3: Mod assist Details for Bed Mobility Assistance: Assist for L LE and trunk. Increased time. Heavy reliance on trapeze Transfers Transfers: Sit to Stand;Stand to Sit Sit to Stand: 1: +2 Total assist;From bed;From elevated surface Sit to Stand: Patient Percentage: 50% Stand to Sit: 1: +2 Total assist;To bed;To elevated surface Stand to Sit: Patient Percentage: 50% Details for Transfer Assistance: Assist to rise, stabilize, control descent. Pt unable to use L UE to assist with standing (pt keeps it in  flexed position). Increased time.  Ambulation/Gait Ambulation/Gait Assistance: 1: +2 Total assist Ambulation/Gait: Patient Percentage: 60% Assistive device: Rolling walker Ambulation/Gait Assistance Details: 2-3 side steps towards HOB with pt holding onto walker with R hand only-unable to use L UE to assist with WBing. Pt only able to slide L LE backwards and/or sideways.  Gait Pattern: Antalgic;Trunk flexed    Exercises     PT Diagnosis: Difficulty walking;Abnormality of gait;Generalized weakness;Acute pain  PT Problem List: Decreased strength;Decreased range of motion;Decreased activity tolerance;Decreased balance;Decreased mobility;Obesity;Decreased knowledge of use of DME;Pain PT Treatment Interventions: DME instruction;Gait training;Functional mobility training;Therapeutic activities;Therapeutic exercise;Balance training;Patient/family education     PT Goals(Current goals can be found in the care plan section) Acute Rehab PT Goals Patient Stated Goal: less pain.  PT Goal Formulation: With patient Time For Goal Achievement: 03/13/13 Potential to Achieve Goals: Good  Visit Information  Last PT Received On: 02/27/13 Assistance Needed: +2 History of Present Illness: 44 to male admitted with chst pain, polyarthritis/synovitis, gout flare?, fall. Hx of gout, HTN, vertigo, sleep apnea. Pt lives alone.        Prior [place] expects to be discharged to:: Private residence Living Arrangements: Alone Type of Home: House Home Access: Stairs to enter [employer] of Steps: 1 step Home Layout: One level Home Equipment: [place] - single point Prior Function Level of Independence: Independent with assistive device(s) Comments: used cane occasionally Communication Communication: No difficulties    Cognition  Cognition Arousal/Alertness: Awake/alert Behavior During Therapy: WFL for tasks assessed/performed Overall Cognitive Status: Within  Functional Limits  for tasks assessed    Extremity/Trunk Assessment Upper Extremity Assessment Upper Extremity Assessment: LUE deficits/detail LUE Deficits / Details: limited/painful ROM-pt unable to fully extend elbow. Distal swelling noted as well LUE: Unable to fully assess due to pain Lower Extremity Assessment Lower Extremity Assessment: LLE deficits/detail LLE Deficits / Details: Knee flex ~40 degrees sitting EOB. LE tender to touch. Pain with any and all mobility.  LLE: Unable to fully assess due to pain Cervical / Trunk Assessment Cervical / Trunk Assessment: Normal   Balance    End of Session PT - End of Session Equipment Utilized During Treatment: Gait belt Activity Tolerance: Patient limited by pain Patient left: in bed;with call bell/phone within reach Nurse Communication: Mobility status  GP     Weston Anna, MPT Pager: [phone]

## 2013-02-28 ENCOUNTER — Encounter (HOSPITAL_COMMUNITY): Payer: Self-pay | Admitting: Internal Medicine

## 2013-02-28 DIAGNOSIS — M109 Gout, unspecified: Secondary | ICD-10-CM

## 2013-02-28 LAB — BASIC METABOLIC PANEL
BUN: 23 mg/dL (ref 6–23)
CO2: 26 meq/L (ref 19–32)
Calcium: 8.6 mg/dL (ref 8.4–10.5)
Chloride: 103 mEq/L (ref 96–112)
Creatinine, Ser: 1.17 mg/dL (ref 0.50–1.35)
GFR calc Af Amer: 73 mL/min — ABNORMAL LOW (ref >90)
GFR, EST NON AFRICAN AMERICAN: 63 mL/min — AB (ref >90)
GLUCOSE: 144 mg/dL — AB (ref 70–99)
POTASSIUM: 4.8 meq/L (ref 3.7–5.3)
Sodium: 140 mEq/L (ref 137–147)

## 2013-02-28 LAB — HEMOGLOBIN A1C
Hgb A1c MFr Bld: 5.9 % — ABNORMAL HIGH
Mean Plasma Glucose: 123 mg/dL — ABNORMAL HIGH

## 2013-02-28 LAB — CBC
HCT: 25.5 % — ABNORMAL LOW (ref 39.0–52.0)
HEMOGLOBIN: 7.9 g/dL — AB (ref 13.0–17.0)
MCH: 24.7 pg — AB (ref 26.0–34.0)
MCHC: 31 g/dL (ref 30.0–36.0)
MCV: 79.7 fL (ref 78.0–100.0)
Platelets: 524 10*3/uL — ABNORMAL HIGH (ref 150–400)
RBC: 3.2 MIL/uL — ABNORMAL LOW (ref 4.22–5.81)
RDW: 18.1 % — ABNORMAL HIGH (ref 11.5–15.5)
WBC: 16.2 10*3/uL — ABNORMAL HIGH (ref 4.0–10.5)

## 2013-02-28 MED ORDER — PREDNISONE 20 MG PO TABS: 60.0000 mg | ORAL_TABLET | Freq: Every day | ORAL | Status: DC

## 2013-02-28 MED ORDER — FERROUS SULFATE 325 (65 FE) MG PO TABS: 325.0000 mg | ORAL_TABLET | Freq: Two times a day (BID) | ORAL | Status: DC

## 2013-02-28 MED ORDER — PANTOPRAZOLE SODIUM 40 MG PO TBEC: 40.0000 mg | DELAYED_RELEASE_TABLET | Freq: Two times a day (BID) | ORAL | Status: DC

## 2013-02-28 MED ORDER — PANTOPRAZOLE SODIUM 40 MG PO TBEC: 40.0000 mg | DELAYED_RELEASE_TABLET | Freq: Every day | ORAL | Status: DC

## 2013-02-28 MED ORDER — DSS 100 MG PO CAPS: 100.0000 mg | ORAL_CAPSULE | Freq: Two times a day (BID) | ORAL | Status: DC

## 2013-02-28 MED ORDER — HYDROCODONE-ACETAMINOPHEN 5-325 MG PO TABS: 2.0000 | ORAL_TABLET | ORAL | Status: DC | PRN

## 2013-02-28 MED ORDER — PREDNISONE 50 MG PO TABS
60.0000 mg | ORAL_TABLET | Freq: Every day | ORAL | Status: DC
  Filled 2013-02-28: qty 1

## 2013-02-28 NOTE — Progress Notes (Signed)
    Progress Note   Subjective  feels fine, looking forward to discharge today   Objective   Vital signs in last 24 hours: Temp:  [97.4 F (36.3 C)-98 F (36.7 C)] 98 F (36.7 C) (01/02 0506) Pulse Rate:  [76-88] 76 (01/02 0506) Resp:  [18-20] 19 (01/02 0506) BP: (135-147)/(69-77) 147/77 mmHg (01/02 0506) SpO2:  [93 %-98 %] 95 % (01/02 0506) Last BM Date: 02/22/13 General:    Pleasant obese white male in NAD Abdomen:  Soft, obese,nontender. Normal bowel sounds. Neurologic:  Alert and oriented,  grossly normal neurologically. Psych:  Cooperative. Normal mood and affect.  Lab Results:  Recent Labs  02/26/13 0454 02/27/13 1127 02/28/13 0459  WBC 15.5* 16.3* 16.2*  HGB 7.8* 8.5* 7.9*  HCT 25.7* 27.0* 25.5*  PLT 386 453* 524*   BMET  Recent Labs  02/25/13 1142 02/26/13 0454 02/28/13 0459  NA 137 136* 140  K 3.8 4.2 4.8  CL 95* 96 103  CO2 28 27 26   GLUCOSE 127* 122* 144*  BUN 17 28* 23  CREATININE 1.25 1.60* 1.17  CALCIUM 9.1 8.6 8.6   PT/INR  Recent Labs  02/26/13 0454  LABPROT 14.5  INR 1.15     Assessment / Plan:   1. Upper GI bleed. Small clean based gastric ulcer on EGD. He takes NSAIDs at home. Biopsy still pending. Continue BID PPI at home until seen in office in a couple of weeks (I made him appt with me). Avoidance of NSAIDs preferable.  .  2. Anemia of acute blood loss. Baseline hgb around 13-14, down to 7.9 but stable.  He doesn't feel dizzy or SOB.  He is s/p IV iron. Should be discharged on oral iron as well.   LOS: 3 days   Tye Savoy  02/28/2013, 10:31 AM   [facility] Attending  I have also seen and assessed the patient and agree with the above note. Gatha Mayer, MD, [facility] [phone] (pager) 02/28/2013 1:16 PM

## 2013-02-28 NOTE — Discharge Summary (Signed)
Physician Discharge Summary  [name] [id number] DOB: 05-31-1945 DOA: 02/25/2013  PCP: Purvis Kilts, MD  Admit date: 02/25/2013 Discharge date: 02/28/2013  Recommendations for Outpatient Follow-up:  1. Pt will need to follow up with PCP in 1 weeks post discharge 2. Please obtain BMP to evaluate electrolytes and kidney function 3. Please also check CBC to evaluate Hg and Hct levels 4. Please evaluate patient for starting allopurinol in the setting of hyperuricemia with recent acute gouty arthritis 5. Please followup on hemoglobin A1c  Discharge Diagnoses:  Active Problems:   Morbid obesity   Chest pain   Acute blood loss anemia   Polyarthritis   Unspecified essential hypertension   Leukocytosis   Heme + stool   Gastric ulcer, acute without hemorrhage or perforation polyarthritis/synovitis/acute gouty arthritis  -His clinical symptoms most likely represent acute gouty arthritis and correlate with his recent running out of indomethacin  -cell count does not suggests septic arthritis  -+urate crystals on arthrocentesis  -IR for arthrocentesis left knee -02/26/13  -send synovial fluid for cell count, crystal analysis, Gram stain and culture  -Discontinue prednisone  -Start Solu-Medrol 60 mg IV every 12 hours as the patient's pain has not significantly improved with prednisone  -The patient's synovitis significantly improved after starting intravenous Solu-Medrol -He was transitioned to oral prednisone -The patient will be discharged on prednisone 60 mg daily x2 days, 40 mg daily x2 days, 20 mg daily x2 days -uric acid--8.1  -The patient was instructed to follow up with PCP for possible start of allopurinol given the patient's history of hyperuricemia in the setting of acute gouty arthritis -X-ray left knee, left wrist, elbow, shoulder-- no fracture or dislocations  -Physical therapy evaluation  -venous duplex legs--neg for DVT  Anemia with positive FOBT  -Hgb remains  stable  -Patient has chronic use of indomethacin with melena  -Appreciate GI--Dr. Carlean Purl -02/26/13 EGD--> small nonbleeding gastric antral ulcer  -Hemoglobin has dropped from 13.8-8.1 in the past 11 months  -Iron saturation less than 10%--> give a dose of IV iron, start ferrous sulfate  -Start PPI  bid -Avoid NSAIDs--the patient was educated to stop indomethacin and naproxen as well as other NSAIDs Iron deficiency anemia -Patient received one dose of IV Nulecit -He will continue on ferrous sulfate 325 mg twice a day -Hemoglobin remained stable throughout the hospitalization Atypical chest pain  -Likely musculoskeletal  -Cycle troponins--negative x3  Hypoxemia  -likely due to hypoventilation due to obesity hypoventilation syndrome  -repeat CXR--neg for edema or infiltrate  CKD stage II  -Baseline creatinine 1.1-1.2  -Judicious IV fluids  Leukocytosis  -Likely due to acute medical condition and gouty arthritis flare  -Continue trending  -Afebrile and hemodynamically stable at present  -UA-negative for pyuria  -Chest x-ray without infiltrate  Hypertension  -controlled  -d/c ARB as creatinine has climbed today to 1.6  -increase IVF  Constipation  -Start cathartic  Deconditioning -Physical therapy recommended skilled nursing facility -Patient refused skilled nursing facility -Home health physical therapy was set up Family Communication: Pt at beside  Disposition Plan: Patient refused skilled nursing facility  Discharge Condition: Stable  Disposition:  Follow-up Information   Follow up with Tye Savoy, NP On 03/13/2013. (at 9:30am)    Specialty:  Nurse Practitioner   Contact information:   [address] [place]. [place] [address] [phone]       Diet:cardiac Wt Readings from Last 3 Encounters:  02/25/13 140 kg (308 lb 10.3 oz)  02/25/13 140 kg (308 lb  10.3 oz)  06/17/12 150.866 kg (332 lb 9.6 oz)    History of present illness:  68 year old male with  history of hypertension, GERD arthritis, osteoarthritis, and obstructive sleep apnea presents with 2-3 day history of worsening polyarthritis and synovitis involving his left knee, left elbow, left wrist, and left shoulder. The lesser extent, the patient is also having bilateral ankle pain and synovitis. The patient normally takes indomethacin 25 mg 4 times daily for the past 2 years. He ran out approximately 2-3 days ago which has correlated with the increase in his synovitis. The patient is also told that he has had osteoarthritis of his left knee, left wrist, left elbow, and left shoulder. He denies any recent injury or falls. However, because of his polyarthritis he has not been able to get out of bed for the past 2-3 days because of pain. When he tried to get out of bed today, his mattress slipped over the box spring and the patient tried to catch himself from falling during which time he experienced a tearing type of chest discomfort associated with some shortness of breath and diaphoresis. Currently, he denies any chest discomfort, shortness of breath, coughing, hemoptysis. Approximately 3 days ago, the patient felt that he ate some bad food and had nausea and vomiting which has since resolved. He has been eating fine since then and denies any vomiting, diarrhea, or nausea. He has not had any fevers or chills. He denies any dysuria, hematuria, hematochezia. He complaints of melanotic stools.  In the ED, the patient was noted to have serum creatinine 1.25, WBC 16.0, hemoglobin 8.1. Initial troponin was negative. Chest x-ray showed bibasilar atelectasis. FOBT was positive.     Discharge Exam: Filed Vitals:   02/28/13 0506  BP: 147/77  Pulse: 76  Temp: 98 F (36.7 C)  Resp: 19   Filed Vitals:   02/27/13 1417 02/27/13 1812 02/27/13 2131 02/28/13 0506  BP: 135/69  147/75 147/77  Pulse: 88  86 76  Temp: 97.4 F (36.3 C)  98 F (36.7 C) 98 F (36.7 C)  TempSrc: Oral  Oral Oral  Resp: 20  18  19   Height:      Weight:      SpO2: 98% 93% 95% 95%   General: A&O x 3, NAD, pleasant, cooperative Cardiovascular: RRR, no rub, no gallop, no S3 Respiratory: CTAB, no wheeze, no rhonchi Abdomen:soft, nontender, nondistended, positive bowel sounds Extremities: 2+ edema bilateral lower extremities. Left knee, wrist, elbow, shoulder with mild synovitis. No erythema. No crepitance.  Discharge Instructions      Discharge Orders   Future Appointments Provider Department Dept Phone   03/13/2013 9:30 AM Willia Craze, NP [facility] [phone]   Future Orders Complete By Expires   Diet - low sodium heart healthy  As directed    Discharge instructions  As directed    Comments:     Prednisone--Start 03/01/13.  Take 60mg  (3 tablets) once daily x 2 days, then 40mg  (2 tablets)  once daily x 2 days, then 20mg  (1 tablet) once daily x 2 days   Increase activity slowly  As directed        Medication List    STOP taking these medications       ANACIN MAX STRENGTH 500-32 MG Tabs  Generic drug:  Aspirin-Caffeine     indomethacin 25 MG capsule  Commonly known as:  INDOCIN     naproxen sodium 220 MG tablet  Commonly known as:  ANAPROX  olmesartan 40 MG tablet  Commonly known as:  BENICAR      TAKE these medications       aspirin 81 MG tablet  Take 81 mg by mouth every other day.     cyclobenzaprine 10 MG tablet  Commonly known as:  FLEXERIL  Take 10 mg by mouth 3 (three) times daily as needed for muscle spasms (pain).     DSS 100 MG Caps  Take 100 mg by mouth 2 (two) times daily.     ferrous sulfate 325 (65 FE) MG tablet  Take 1 tablet (325 mg total) by mouth 2 (two) times daily with a meal.     HYDROcodone-acetaminophen 5-325 MG per tablet  Commonly known as:  NORCO/VICODIN  Take 2 tablets by mouth every 4 (four) hours as needed for moderate pain.     meclizine 25 MG tablet  Commonly known as:  ANTIVERT  Take 25 mg by mouth 3 (three) times  daily as needed.     pantoprazole 40 MG tablet  Commonly known as:  PROTONIX  Take 1 tablet (40 mg total) by mouth daily.     pantoprazole 40 MG tablet  Commonly known as:  PROTONIX  Take 1 tablet (40 mg total) by mouth 2 (two) times daily.     predniSONE 20 MG tablet  Commonly known as:  DELTASONE  Take 3 tablets (60 mg total) by mouth daily with breakfast. Start 03/01/13.  Take 60mg  once daily x 2 days, then 40mg  once daily x 2 days, then 20mg  once daily x 2 days  Start taking on:  03/01/2013         The results of significant diagnostics from this hospitalization (including imaging, microbiology, ancillary and laboratory) are listed below for reference.    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 This does not relate to CABG.   Electronically Signed   By: Nelson Chimes M.D.   On: 02/25/2013 14:33   02/25/2013   CLINICAL DATA:  Chest pain.  Left arm and leg swelling.  EXAM: CHEST  2 VIEW  COMPARISON:  03/11/2012  FINDINGS: Artifact overlies the chest. There has been previous median sternotomy. The heart is enlarged. There may be mild basilar atelectasis. The upper lungs are clear. No apparent effusion.  IMPRESSION: Previous CABG.  Cardiomegaly.  Possible mild basilar atelectasis.  Electronically Signed: By: Nelson Chimes M.D. On: 02/25/2013 12:42   Dg Elbow 2 Views Left  02/25/2013   CLINICAL DATA:  Elbow joint pain. Limited range of motion. No prior injury.  EXAM: LEFT ELBOW - 2 VIEW  COMPARISON:  None.  FINDINGS: The left elbow demonstrates no fracture or dislocation. There is no significant joint effusion. The soft tissues are normal.  IMPRESSION: No acute osseous injury of the left elbow.   Electronically Signed   By: Kathreen Devoid   On: 02/25/2013 17:00   Dg Wrist 2 Views Left  02/25/2013   CLINICAL DATA:  Wrist pain  EXAM: LEFT WRIST - 2 VIEW  COMPARISON:  None.   FINDINGS: There is no acute fracture or dislocation. There are severe degenerative changes of the [facility] joint. The soft tissues are normal.  IMPRESSION: No acute osseous injury of the left wrist.   Electronically Signed   By: Kathreen Devoid   On: 02/25/2013 16:59   Dg Knee 1-2 Views Left  02/25/2013   CLINICAL  DATA:  Knee pain and edema.  No injury.  EXAM: LEFT KNEE - 1-2 VIEW  COMPARISON:  None.  FINDINGS: Exam demonstrates moderate tricompartmental osteoarthritic change. There is a suggestion of a small joint effusion. There is no fracture dislocation.  IMPRESSION: Moderate osteoarthritic change and possible small joint effusion.   Electronically Signed   By: Marin Olp M.D.   On: 02/25/2013 17:00   Dg Chest Port 1 View  02/26/2013   CLINICAL DATA:  Shortness breath. Chest pain. Hypoxia. History of high blood pressure.  EXAM: PORTABLE CHEST - 1 VIEW  COMPARISON:  02/25/2013.  FINDINGS: Prior median sternotomy.  Cardiomegaly.  Limited evaluation of the mediastinum on this AP view.  No pulmonary edema, gross pneumothorax or segmental infiltrate.  Evaluation of lung bases limited by slightly elevated right hemidiaphragm and cardiomegaly.  IMPRESSION: Cardiomegaly.  Please see above.   Electronically Signed   By: Chauncey Cruel M.D.   On: 02/26/2013 15:44   Dg Shoulder Left  02/25/2013   CLINICAL DATA:  Anterior and superior shoulder pain. Unable to abduct.  EXAM: LEFT SHOULDER - 2+ VIEW  COMPARISON:  None.  FINDINGS: Evaluation is limited secondary to body habitus. There is no fracture or dislocation. The acromioclavicular joint is unremarkable.  IMPRESSION: No acute osseous injury of the left shoulder. Evaluation limited secondary to body habitus.   Electronically Signed   By: Kathreen Devoid   On: 02/25/2013 17:01   Dg Fluoro Guide Ndl Plc/bx  02/26/2013   CLINICAL DATA:  Left knee pain.  EXAM: Left KNEE INJECTION UNDER FLUOROSCOPY  FLUOROSCOPY TIME:  25 seconds  PROCEDURE: Overlying skin prepped with  Betadine, draped in the usual sterile fashion, and infiltrated locally with buffered Lidocaine. A 20 gauge spinal needle advanced to the left knee joint space. Diagnostic injection of 1-2 mL Omnipaque 300 demonstrates intra-articular spread without intravascular component.  11 mL of yellow thick viscous, turbid synovial fluid was aspirated from the left knee joint.  The needle was removed. There were no immediate complications. The patient tolerated the procedure well.  IMPRESSION: Technically successful left knee aspiration under fluoroscopy.  The aspirate was thick, viscous, turbid synovial fluid concerning for infection. The fluid was sent for laboratory analysis, gram stain and sensitivities.   Electronically Signed   By: Kathreen Devoid   On: 02/26/2013 15:46     Microbiology: Recent Results (from the past 240 hour(s))  CULTURE, BLOOD (ROUTINE X 2)     Status: None   Collection Time    02/25/13  4:45 PM      Result Value Range Status   Specimen Description BLOOD RIGHT ARM   Final   Special Requests BOTTLES DRAWN AEROBIC AND ANAEROBIC 10CC   Final   Culture  Setup Time     Final   Value: 02/25/2013 22:48     Performed at [facility]   Culture     Final   Value:        BLOOD CULTURE RECEIVED NO GROWTH TO DATE CULTURE WILL BE HELD FOR 5 DAYS BEFORE ISSUING A FINAL NEGATIVE REPORT     Performed at [facility]   Report Status PENDING   Incomplete  CULTURE, BLOOD (ROUTINE X 2)     Status: None   Collection Time    02/25/13  4:50 PM      Result Value Range Status   Specimen Description BLOOD RIGHT HAND   Final   Special Requests BOTTLES DRAWN AEROBIC AND ANAEROBIC  10CC   Final   Culture  Setup Time     Final   Value: 02/25/2013 22:48     Performed at [facility]   Culture     Final   Value:        BLOOD CULTURE RECEIVED NO GROWTH TO DATE CULTURE WILL BE HELD FOR 5 DAYS BEFORE ISSUING A FINAL NEGATIVE REPORT     Performed at [facility]   Report Status  PENDING   Incomplete  BODY FLUID CULTURE     Status: None   Collection Time    02/26/13  3:00 PM      Result Value Range Status   Specimen Description SYNOVIAL   Final   Special Requests NONE LEFT KNEE   Final   Gram Stain     Final   Value: WBC PRESENT, PREDOMINANTLY PMN     NO ORGANISMS SEEN     Performed at [facility]   Culture     Final   Value: NO GROWTH 1 DAY     Performed at [facility]   Report Status PENDING   Incomplete     Labs: Basic Metabolic Panel:  Recent Labs Lab 02/25/13 1142 02/26/13 0454 02/28/13 0459  NA 137 136* 140  K 3.8 4.2 4.8  CL 95* 96 103  CO2 28 27 26   GLUCOSE 127* 122* 144*  BUN 17 28* 23  CREATININE 1.25 1.60* 1.17  CALCIUM 9.1 8.6 8.6   Liver Function Tests: No results found for this basename: AST, ALT, ALKPHOS, BILITOT, PROT, ALBUMIN,  in the last 168 hours No results found for this basename: LIPASE, AMYLASE,  in the last 168 hours No results found for this basename: AMMONIA,  in the last 168 hours CBC:  Recent Labs Lab 02/25/13 1142 02/26/13 0454 02/27/13 1127 02/28/13 0459  WBC 16.0* 15.5* 16.3* 16.2*  NEUTROABS 11.7*  --   --   --   HGB 8.1* 7.8* 8.5* 7.9*  HCT 25.7* 25.7* 27.0* 25.5*  MCV 78.6 80.3 79.6 79.7  PLT 381 386 453* 524*   Cardiac Enzymes:  Recent Labs Lab 02/25/13 1142 02/25/13 1833 02/26/13 0100  TROPONINI <0.30 <0.30 <0.30   BNP: No components found with this basename: POCBNP,  CBG: No results found for this basename: GLUCAP,  in the last 168 hours  Time coordinating discharge:  Greater than 30 minutes  Signed:  Samera Macy, DO [facility] Pager: [phone] 02/28/2013, 10:55 AM

## 2013-02-28 NOTE — Clinical Documentation Improvement (Signed)
02/26/13 noted per MD consult note..."Morbidly obese but not diabetic"... Per MD note 02/26/13.Marland KitchenMarland Kitchen"Hypoxemia -likely due to hypoventilation due to body habitus.".Marland KitchenMarland Kitchen Per Doc Flowsheet; wt= 308 lbs [169.9 kg]; ht= 5'9"; BMI= 45.7 For accurate DX specificity & severity can noted condition be linked w/ clinical data per flowsheet documentation. Thank you  Possible Clinical conditions Morbid Obesity W/ BMI Underweight w/BMI Other condition Cannot clinically determine  Risk Factors: See note Sign & Symptoms: see note Diagnostics: see note Treatment: see note  Thank You, Ermelinda Das, RN, BSN, CCDS Certified Clinical Documentation Specialist Pager: [place] Information Management

## 2013-02-28 NOTE — Progress Notes (Signed)
   CARE MANAGEMENT NOTE 02/28/2013  Patient:  [name], [name]   Account Number:  [id number]  Date Initiated:  02/26/2013  Documentation initiated by:  [facility]  Subjective/Objective Assessment:   68 Y/O M ADMITTED W/CHEST PAIN,ANEMIA,BLACK STOOLS.     Action/Plan:   FROM HOME.   Anticipated DC Date:  02/28/2013   Anticipated DC Plan:  [place]  CM consult      Choice offered to / List presented to:  C-1 Patient   DME arranged  Vassie Moselle      DME agency  [place] arranged  [facility]   Status of service:  Completed, signed off Medicare Important Message given?   (If response is "NO", the following Medicare IM given date fields will be blank) Date Medicare IM given:   Date Additional Medicare IM given:    Discharge Disposition:  [place]  Per UR Regulation:  Reviewed for med. necessity/level of care/duration of stay  If discussed at [place] of Stay Meetings, dates discussed:    Comments:  02/29/12 [facility] RN,BSN NCM [place] W/SISTER OLLIE HERNDON [id number] [address], [place]  [address] TEL#[phone].[facility] DME REP JERMAINE BROUGHT RW TO RM.[facility] CHOSEN FOR HH.TC KRISTEN [facility] REP AWARE OF HHPT/OT ORDER,&  D/C. DECLINES SNF.WANT HOME [place].RECEIVED ORDERS FOR HHPT/OT.HE WILL STAY W/SISTER DOESN'T KNOW HER ADDRESS.NURSE TO CALL ME WHEN SISTER COMES TO OFFER [place].RECOMMENDED FOR HHPT/OT.PAGED PT FOR CONFIRM RW W/LUE PLATFORM OR PLAIN RW SINCE PATIENT STATES HIS L ARM IS FEELING BETTER.  02/26/13 Azayla Polo RN,BSN NCM [phone]

## 2013-02-28 NOTE — Progress Notes (Signed)
Physical Therapy Treatment Patient Details Name: DAWUAN BENDOLPH MRN: [id number] DOB: November 06, 1945 Today's Date: 02/28/2013 Time: [id number] PT Time Calculation (min): 23 min  PT Assessment / Plan / Recommendation  History of Present Illness 23 to male admitted with chst pain, polyarthritis/synovitis, gout flare?, fall. Hx of gout, HTN, vertigo, sleep apnea. Pt lives alone.    PT Comments   Progressing with mobility. Pt able to mobilize/ambulate this session with Min assist. Pt refusing SNF placement-plans to d/c home with sister. Recommend HHPT, 24/7 care.   Follow Up Recommendations  Home health PT;Supervision/Assistance - 24 hour (pt refusing SNF)     Does the patient have the potential to tolerate intense rehabilitation     Barriers to Discharge        Equipment Recommendations  Rolling walker with 5" wheels (wide/bariatric)    Recommendations for Other Services OT consult  Frequency Min 3X/week   Progress towards PT Goals Progress towards PT goals: Progressing toward goals  Plan Discharge plan needs to be updated    Precautions / Restrictions Precautions Precautions: Fall Restrictions Weight Bearing Restrictions: No   Pertinent Vitals/Pain 5/10 L LE with mobility    Mobility  Bed Mobility Bed Mobility: Supine to Sit Supine to Sit: HOB flat;4: Min assist Details for Bed Mobility Assistance: min support for trunk Transfers Transfers: Sit to Stand;Stand to Sit Sit to Stand: 4: Min assist;From bed Stand to Sit: 4: Min assist;To chair/3-in-1 Details for Transfer Assistance: assist to rise, steady, control descent. VCs safety, technique, hand placement. LOB x1 posteriorly with initial standing.  Ambulation/Gait Ambulation/Gait Assistance: 4: Min assist Ambulation Distance (Feet): 60 Feet Assistive device: Rolling walker Ambulation/Gait Assistance Details: Assist to stabilize throughout ambulation. fatigues easily. followed with recliner.  Gait Pattern: Step-through  pattern;Antalgic;Decreased stride length    Exercises     PT Diagnosis:    PT Problem List:   PT Treatment Interventions:     PT Goals (current goals can now be found in the care plan section)    Visit Information  Last PT Received On: 02/28/13 Assistance Needed: +1 Reason for Co-Treatment: For patient/therapist safety PT goals addressed during session: Mobility/safety with mobility OT goals addressed during session: ADL's and self-care;Proper use of Adaptive equipment and DME History of Present Illness: 43 to male admitted with chst pain, polyarthritis/synovitis, gout flare?, fall. Hx of gout, HTN, vertigo, sleep apnea. Pt lives alone.     Subjective Data      Cognition  Cognition Arousal/Alertness: Awake/alert Behavior During Therapy: WFL for tasks assessed/performed Overall Cognitive Status: Within Functional Limits for tasks assessed    Balance  Balance Balance Assessed: Yes Static Standing Balance Static Standing - Balance Support: Bilateral upper extremity supported Static Standing - Level of Assistance: 5: Stand by assistance;4: Min assist (min A when he first stood)  End of Session PT - End of Session Activity Tolerance: Patient limited by fatigue Patient left: in chair;with call bell/phone within reach;with family/visitor present   GP     Weston Anna, MPT Pager: [phone]

## 2013-02-28 NOTE — Evaluation (Signed)
Occupational Therapy Evaluation Patient Details Name: [name] MRN: [id number] DOB: 09-07-1945 Today's Date: 02/28/2013 Time: 6384-6659 OT Time Calculation (min): 24 min  OT Assessment / Plan / Recommendation History of present illness 48 to male admitted with chst pain, polyarthritis/synovitis, gout flare?, fall. Hx of gout, HTN, vertigo, sleep apnea. Pt lives alone.    Clinical Impression   Pt was admitted with the above.  When PT saw him prior to this visit, it took 2 people to stand. He is now moving with min A overall for mobility and adls. He will benefit from continued OT at home (sister's) to reach a mod I level.  Pt is planning discharge today    OT Assessment  All further OT needs can be met in the next venue of care (pt is discharging today)    Follow Up Recommendations  Home health OT    Barriers to Discharge      Equipment Recommendations  None recommended by OT    Recommendations for Other Services    Frequency       Precautions / Restrictions Precautions Precautions: Fall Restrictions Weight Bearing Restrictions: No   Pertinent Vitals/Pain Pt has pain in R shoulder, but able to use RW.  Not rated.  Modified activity    ADL  Toilet Transfer: Minimal assistance Toilet Transfer Method: Sit to stand Toilet Transfer Equipment: Comfort height toilet;Grab bars Toileting - Clothing Manipulation and Hygiene: Minimal assistance Where Assessed - Toileting Clothing Manipulation and Hygiene: Sit to stand from 3-in-1 or toilet Equipment Used: Rolling walker Transfers/Ambulation Related to ADLs: ambulated to bathroom with min A.  Initially had loss of balance when he first stood, requiring assist to right self ADL Comments: pt needs set up overall for UB adls and min A for  LB adls.  He is able to cross legs for adls.  Has a toilet aide at home, and needs this for back peri area.  Will stay with his sister who is a home care provider    OT Diagnosis: Generalized  weakness  OT Problem List: Decreased strength;Decreased activity tolerance;Impaired balance (sitting and/or standing);Pain OT Treatment Interventions:     OT Goals(Current goals can be found in the care plan section)    Visit Information  Last OT Received On: 02/28/13 Assistance Needed: +1 PT/OT/SLP Co-Evaluation/Treatment: Yes Reason for Co-Treatment: For patient/therapist safety PT goals addressed during session: Mobility/safety with mobility OT goals addressed during session: ADL's and self-care;Proper use of Adaptive equipment and DME History of Present Illness: 26 to male admitted with chst pain, polyarthritis/synovitis, gout flare?, fall. Hx of gout, HTN, vertigo, sleep apnea. Pt lives alone.        Prior [place] expects to be discharged to::  (sisters house) Living Arrangements: Other relatives Type of Home: House Home Access: Level entry Home Layout: One level Additional Comments: high toilet, bathtub . 2 bars by commode--very high commode Prior Function Level of Independence: Independent with assistive device(s) Communication Communication: No difficulties Dominant Hand: Right         Vision/Perception     Cognition  Cognition Arousal/Alertness: Awake/alert Behavior During Therapy: WFL for tasks assessed/performed Overall Cognitive Status: Within Functional Limits for tasks assessed    Extremity/Trunk Assessment Upper Extremity Assessment Upper Extremity Assessment: LUE deficits/detail LUE Deficits / Details: able to lift shoulder only 30 degrees due to gout pain.  elbow wfls.  pt tended to hold walker with loose grip     Mobility Bed Mobility  Supine to Sit: 4: Min assist;HOB flat Details for Bed Mobility Assistance: min support for trunk Transfers Sit to Stand: 4: Min assist;From toilet;From bed;With upper extremity assist Details for Transfer Assistance: assist to risse and steady     Exercise     Balance  Balance Balance Assessed: Yes Static Standing Balance Static Standing - Balance Support: Bilateral upper extremity supported Static Standing - Level of Assistance: 5: Stand by assistance;4: Min assist (min A when he first stood)   End of Session OT - End of Session Activity Tolerance: Patient tolerated treatment well Patient left: in chair;with call bell/phone within reach;with family/visitor present  [place] 02/28/2013, 1:24 PM Lesle Chris, OTR/L [phone] 02/28/2013

## 2013-02-28 NOTE — Progress Notes (Signed)
Clinical Social Work Department BRIEF PSYCHOSOCIAL ASSESSMENT 02/28/2013  Patient:  [name], [name]     Account Number:  [id number]     Admit date:  02/25/2013  Clinical Social Worker:  Venia Minks  Date/Time:  02/28/2013 12:00 M  Referred by:  Physician  Date Referred:  02/28/2013 Referred for  SNF Placement   Other Referral:   Interview type:  Patient Other interview type:    PSYCHOSOCIAL DATA Living Status:  ALONE Admitted from facility:   Level of care:   Primary support name:  Nolberto Hanlon Primary support relationship to patient:  SIBLING Degree of support available:   fair    CURRENT CONCERNS Current Concerns  Post-Acute Placement   Other Concerns:    SOCIAL WORK ASSESSMENT / PLAN CSW met with patient. Patient is alert and oriented x3. physical therapy recommending snf placement. CSW discussed same with patient. Patient states that previously he hadnt had a bowel movement in 5 days. he had one last night and now he is able to move his legs and thinks he will be able to go home. He states that his sister is a Actuary for the elderly and he will go stay with her for a few weeks when he is discharged. He is refusing snf at this time.   Assessment/plan status:   Other assessment/ plan:   Information/referral to community resources:    PATIENT'S/FAMILY'S RESPONSE TO PLAN OF CARE: patient is pleasant and cooperative but a little unrealistic about his limitations. He declines need for snf at this time. no further CSW needs noted. Notified MD.

## 2013-03-01 LAB — BODY FLUID CULTURE: Culture: NO GROWTH

## 2013-03-03 LAB — CULTURE, BLOOD (ROUTINE X 2)
Culture: NO GROWTH
Culture: NO GROWTH

## 2013-03-13 ENCOUNTER — Ambulatory Visit (INDEPENDENT_AMBULATORY_CARE_PROVIDER_SITE_OTHER): Payer: Medicare Other | Admitting: Nurse Practitioner

## 2013-03-13 ENCOUNTER — Other Ambulatory Visit (INDEPENDENT_AMBULATORY_CARE_PROVIDER_SITE_OTHER): Payer: Medicare Other

## 2013-03-13 ENCOUNTER — Encounter: Payer: Self-pay | Admitting: Nurse Practitioner

## 2013-03-13 VITALS — BP 144/80 | HR 80 | Ht 69.0 in | Wt 303.8 lb

## 2013-03-13 DIAGNOSIS — D649 Anemia, unspecified: Secondary | ICD-10-CM

## 2013-03-13 DIAGNOSIS — D62 Acute posthemorrhagic anemia: Secondary | ICD-10-CM

## 2013-03-13 DIAGNOSIS — K253 Acute gastric ulcer without hemorrhage or perforation: Secondary | ICD-10-CM

## 2013-03-13 LAB — CBC WITH DIFFERENTIAL/PLATELET
BASOS PCT: 0.7 % (ref 0.0–3.0)
Basophils Absolute: 0.1 10*3/uL (ref 0.0–0.1)
Eosinophils Absolute: 0.2 10*3/uL (ref 0.0–0.7)
Eosinophils Relative: 1.6 % (ref 0.0–5.0)
HCT: 33.8 % — ABNORMAL LOW (ref 39.0–52.0)
Hemoglobin: 11 g/dL — ABNORMAL LOW (ref 13.0–17.0)
Lymphocytes Relative: 32.3 % (ref 12.0–46.0)
Lymphs Abs: 4.4 10*3/uL — ABNORMAL HIGH (ref 0.7–4.0)
MCHC: 32.4 g/dL (ref 30.0–36.0)
MCV: 76.8 fl — AB (ref 78.0–100.0)
MONO ABS: 0.8 10*3/uL (ref 0.1–1.0)
Monocytes Relative: 6.1 % (ref 3.0–12.0)
Neutro Abs: 8.2 10*3/uL — ABNORMAL HIGH (ref 1.4–7.7)
Neutrophils Relative %: 59.3 % (ref 43.0–77.0)
PLATELETS: 472 10*3/uL — AB (ref 150.0–400.0)
RBC: 4.41 Mil/uL (ref 4.22–5.81)
RDW: 20 % — ABNORMAL HIGH (ref 11.5–14.6)
WBC: 13.8 10*3/uL — ABNORMAL HIGH (ref 4.5–10.5)

## 2013-03-13 MED ORDER — BIS SUBCIT-METRONID-TETRACYC 140-125-125 MG PO CAPS: 3.0000 | ORAL_CAPSULE | Freq: Three times a day (TID) | ORAL | Status: DC

## 2013-03-13 NOTE — Patient Instructions (Addendum)
Please go to the basement to have your labs drawn. You can decrease pantoprazole 40 mg to once daily 30 minutes before breakfast Continue twice daily iron until we call you with lab results.  We have given you the sample pack for Pylera. Take 3 cap 4 times a day.

## 2013-03-13 NOTE — Progress Notes (Addendum)
Agree with Ms. Guenther's assessment and plan.  In addition I do not think he has had a screening colonoscopy and should have one scheduled if he agrees.  Gatha Mayer, MD, [facility]  Addendum:  According to our hospital consult note he had a screening colonoscopy in [place]. We just saw him in office for hospital follow up. Will verify that he is up to date on screening colonoscopy. If not will arrange for it to be done with [place] (unless patient plans on returning to GI in [place])

## 2013-03-13 NOTE — Progress Notes (Addendum)
     History of Present Illness:   Patient is a 69 year old male who we recently saw while he was hospitalized with a GI bleed. Inpatient upper endoscopy pertinent for a small, clean-based gastric ulcer. Patient had been taking NSAIDs at home. Biopsies compatible with chronic active gastritis with H. Pylori. He is here for hospital followup and doing well. No further bleeding. No abdominal pain. He is taking twice daily PPI. The patient had a several gram drop in hemoglobin during his recent admission but felt Okay and opted against a blood transfusion. He has been taking iron twice daily plus a stool softener. So far no constipation.  Current Medications, Allergies, Past Medical History, Past Surgical History, Family History and Social History were reviewed in [employer] record.  Physical Exam: General: Pleasant, well developed , white male in no acute distress Head: Normocephalic and atraumatic Eyes:  sclerae anicteric, conjunctiva pink  Ears: Normal auditory acuity Lungs: Clear throughout to auscultation Heart: Regular rate and rhythm, +murmur Abdomen: Limited exam, patient unable to get onto examination table secondary to ankle pain. Abdomen is soft, obese, nontender. Active bowel sounds. Musculoskeletal: Symmetrical with no gross deformities  Extremities: No edema  Neurological: Alert oriented x 4, grossly nonfocal Psychological:  Alert and cooperative. Normal mood and affect  Assessment and Recommendations: 75. 68 year old male with a recent upper GI bleed. EGD revealed a small, clean-based antral ulcer. Patient had been taking NSAIDS but gastric biopsy was positive for H. Pylori as well. Patient has discontinued NSAIDs. Will treat H. pylori with course of Pylera. He takes a daily baby ASA so PPI is being continued but I did reduce dose to once daily.  2.  Anemia of acute blood loss.  Baseline hemoglobin 13-14 which fell to high 7 range during recent admission  for upper GI bleed. Patient tolerated anemia,opted against blood transfusion. He was discharged home on twice daily iron. Will recheck CBC today and make further recommendations regarding iron supplements  Addendum 03/19/13: last colonoscopy 2007 in [place]. Leone Payor, RN spoke with patient and he plans to continue with [place] [facility] doctor for future colonoscopies

## 2013-07-08 ENCOUNTER — Other Ambulatory Visit: Payer: Self-pay | Admitting: Surgical

## 2013-07-24 ENCOUNTER — Encounter (HOSPITAL_COMMUNITY): Payer: Self-pay | Admitting: Pharmacy Technician

## 2013-07-28 NOTE — Patient Instructions (Signed)
[name]  07/28/2013   Your procedure is scheduled on:  08/05/2013   1230pm-200pm  Report to [facility]  Follow the Signs to [place] at 1030       am  Call this number if you have problems the morning of surgery: [phone]   Remember:   Do not eat food or drink liquids after midnight.   Take these medicines the morning of surgery with A SIP OF WATER:    Do not wear jewelry,   Do not wear lotions, powders, or perfumes.   . Men may shave face and neck.  Do not bring valuables to the hospital.  Contacts, dentures or bridgework may not be worn into surgery.  Leave suitcase in the car. After surgery it may be brought to your room.  For patients admitted to the hospital, checkout time is 11:00 AM the day of  discharge.         Please read over the following fact sheets that you were given: [facility] - Preparing for Surgery Before surgery, you can play an important role.  Because skin is not sterile, your skin needs to be as free of germs as possible.  You can reduce the number of germs on your skin by washing with CHG (chlorahexidine gluconate) soap before surgery.  CHG is an antiseptic cleaner which kills germs and bonds with the skin to continue killing germs even after washing. Please DO NOT use if you have an allergy to CHG or antibacterial soaps.  If your skin becomes reddened/irritated stop using the CHG and inform your nurse when you arrive at Short Stay. Do not shave (including legs and underarms) for at least 48 hours prior to the first CHG shower.  You may shave your face/neck. Please follow these instructions carefully:  1.  Shower with CHG Soap the night before surgery and the  morning of Surgery.  2.  If you choose to wash your hair, wash your hair first as usual with your  normal  shampoo.  3.  After you shampoo, rinse your hair and body thoroughly to remove the  shampoo.                           4.  Use CHG as you would any other liquid soap.  You  can apply chg directly  to the skin and wash                       Gently with a scrungie or clean washcloth.  5.  Apply the CHG Soap to your body ONLY FROM THE NECK DOWN.   Do not use on face/ open                           Wound or open sores. Avoid contact with eyes, ears mouth and genitals (private parts).                       Wash face,  Genitals (private parts) with your normal soap.             6.  Wash thoroughly, paying special attention to the area where your surgery  will be performed.  7.  Thoroughly rinse your body with warm water from the neck down.  8.  DO NOT shower/wash with your normal soap after using and rinsing off  the CHG Soap.                9.  Pat yourself dry with a clean towel.            10.  Wear clean pajamas.            11.  Place clean sheets on your bed the night of your first shower and do not  sleep with pets. Day of Surgery : Do not apply any lotions/deodorants the morning of surgery.  Please wear clean clothes to the hospital/surgery center.  FAILURE TO FOLLOW THESE INSTRUCTIONS MAY RESULT IN THE CANCELLATION OF YOUR SURGERY PATIENT SIGNATURE_________________________________  NURSE SIGNATURE__________________________________  ________________________________________________________________________   Adam Phenix  An incentive spirometer is a tool that can help keep your lungs clear and active. This tool measures how well you are filling your lungs with each breath. Taking long deep breaths may help reverse or decrease the chance of developing breathing (pulmonary) problems (especially infection) following:  A long period of time when you are unable to move or be active. BEFORE THE PROCEDURE   If the spirometer includes an indicator to show your best effort, your nurse or respiratory therapist will set it to a desired goal.  If possible, sit up straight or lean slightly forward. Try not to slouch.  Hold the incentive spirometer in an  upright position. INSTRUCTIONS FOR USE  1. Sit on the edge of your bed if possible, or sit up as far as you can in bed or on a chair. 2. Hold the incentive spirometer in an upright position. 3. Breathe out normally. 4. Place the mouthpiece in your mouth and seal your lips tightly around it. 5. Breathe in slowly and as deeply as possible, raising the piston or the ball toward the top of the column. 6. Hold your breath for 3-5 seconds or for as long as possible. Allow the piston or ball to fall to the bottom of the column. 7. Remove the mouthpiece from your mouth and breathe out normally. 8. Rest for a few seconds and repeat Steps 1 through 7 at least 10 times every 1-2 hours when you are awake. Take your time and take a few normal breaths between deep breaths. 9. The spirometer may include an indicator to show your best effort. Use the indicator as a goal to work toward during each repetition. 10. After each set of 10 deep breaths, practice coughing to be sure your lungs are clear. If you have an incision (the cut made at the time of surgery), support your incision when coughing by placing a pillow or rolled up towels firmly against it. Once you are able to get out of bed, walk around indoors and cough well. You may stop using the incentive spirometer when instructed by your caregiver.  RISKS AND COMPLICATIONS  Take your time so you do not get dizzy or light-headed.  If you are in pain, you may need to take or ask for pain medication before doing incentive spirometry. It is harder to take a deep breath if you are having pain. AFTER USE  Rest and breathe slowly and easily.  It can be helpful to keep track of a log of your progress. Your caregiver can provide you with a simple table to help with this. If you are using the spirometer at home, follow these instructions: [place] IF:   You are having difficultly using the spirometer.  You have trouble using the spirometer as often  as  instructed.  Your pain medication is not giving enough relief while using the spirometer.  You develop fever of 100.5 F (38.1 C) or higher. SEEK IMMEDIATE MEDICAL CARE IF:   You cough up bloody sputum that had not been present before.  You develop fever of 102 F (38.9 C) or greater.  You develop worsening pain at or near the incision site. MAKE SURE YOU:   Understand these instructions.  Will watch your condition.  Will get help right away if you are not doing well or get worse. Document Released: 06/26/2006 Document Revised: 05/08/2011 Document Reviewed: 08/27/2006 ExitCare Patient Information 2014 [place].   ________________________________________________________________________ , coughing and deep breathing exercises, leg exercises

## 2013-07-29 ENCOUNTER — Encounter (HOSPITAL_COMMUNITY): Payer: Self-pay

## 2013-07-29 ENCOUNTER — Encounter (HOSPITAL_COMMUNITY)
Admission: RE | Admit: 2013-07-29 | Discharge: 2013-07-29 | Disposition: A | Discharge status: Home / Self Care | Payer: Medicare Other | Source: Ambulatory Visit | Attending: Orthopedic Surgery | Admitting: Orthopedic Surgery

## 2013-07-29 ENCOUNTER — Encounter (INDEPENDENT_AMBULATORY_CARE_PROVIDER_SITE_OTHER): Payer: Self-pay

## 2013-07-29 DIAGNOSIS — Z01812 Encounter for preprocedural laboratory examination: Secondary | ICD-10-CM | POA: Insufficient documentation

## 2013-07-29 HISTORY — DX: Shortness of breath: R06.02

## 2013-07-29 LAB — URINALYSIS, ROUTINE W REFLEX MICROSCOPIC
Bilirubin Urine: NEGATIVE
Glucose, UA: NEGATIVE mg/dL
Hgb urine dipstick: NEGATIVE
Ketones, ur: NEGATIVE mg/dL
Leukocytes, UA: NEGATIVE
Nitrite: NEGATIVE
Protein, ur: 30 mg/dL — AB
Specific Gravity, Urine: 1.011 (ref 1.005–1.030)
Urobilinogen, UA: 0.2 mg/dL (ref 0.0–1.0)
pH: 7.5 (ref 5.0–8.0)

## 2013-07-29 LAB — COMPREHENSIVE METABOLIC PANEL
ALT: 16 U/L (ref 0–53)
AST: 22 U/L (ref 0–37)
Albumin: 3.8 g/dL (ref 3.5–5.2)
Alkaline Phosphatase: 39 U/L (ref 39–117)
BUN: 27 mg/dL — ABNORMAL HIGH (ref 6–23)
CO2: 32 mEq/L (ref 19–32)
Calcium: 9.7 mg/dL (ref 8.4–10.5)
Chloride: 95 mEq/L — ABNORMAL LOW (ref 96–112)
Creatinine, Ser: 1.32 mg/dL (ref 0.50–1.35)
GFR calc Af Amer: 63 mL/min — ABNORMAL LOW (ref >90)
GFR calc non Af Amer: 55 mL/min — ABNORMAL LOW (ref >90)
Glucose, Bld: 124 mg/dL — ABNORMAL HIGH (ref 70–99)
Potassium: 5 mEq/L (ref 3.7–5.3)
Sodium: 139 mEq/L (ref 137–147)
Total Bilirubin: 0.3 mg/dL (ref 0.3–1.2)
Total Protein: 9.2 g/dL — ABNORMAL HIGH (ref 6.0–8.3)

## 2013-07-29 LAB — CBC
HEMATOCRIT: 43.3 % (ref 39.0–52.0)
Hemoglobin: 13.5 g/dL (ref 13.0–17.0)
MCH: 23.6 pg — AB (ref 26.0–34.0)
MCHC: 31.2 g/dL (ref 30.0–36.0)
MCV: 75.6 fL — AB (ref 78.0–100.0)
PLATELETS: 390 10*3/uL (ref 150–400)
RBC: 5.73 MIL/uL (ref 4.22–5.81)
RDW: 20.5 % — AB (ref 11.5–15.5)
WBC: 13.9 10*3/uL — ABNORMAL HIGH (ref 4.0–10.5)

## 2013-07-29 LAB — URINE MICROSCOPIC-ADD ON

## 2013-07-29 LAB — PROTIME-INR
INR: 0.94 (ref 0.00–1.49)
Prothrombin Time: 12.4 seconds (ref 11.6–15.2)

## 2013-07-29 LAB — APTT: aPTT: 29 seconds (ref 24–37)

## 2013-07-29 NOTE — Progress Notes (Signed)
EKG and CXR 02/26/13 in [facility]

## 2013-07-29 NOTE — Progress Notes (Signed)
CBC and urinalysis and micro results faxed to Dr [name].

## 2013-08-05 ENCOUNTER — Observation Stay (HOSPITAL_COMMUNITY)
Admission: RE | Admit: 2013-08-05 | Discharge: 2013-08-06 | Disposition: A | Discharge status: Home / Self Care | Payer: Medicare Other | Source: Ambulatory Visit | Attending: Orthopedic Surgery | Admitting: Orthopedic Surgery

## 2013-08-05 ENCOUNTER — Ambulatory Visit (HOSPITAL_COMMUNITY): Payer: Medicare Other | Admitting: Certified Registered Nurse Anesthetist

## 2013-08-05 ENCOUNTER — Encounter (HOSPITAL_COMMUNITY): Payer: Medicare Other | Admitting: Certified Registered Nurse Anesthetist

## 2013-08-05 ENCOUNTER — Encounter (HOSPITAL_COMMUNITY)
Admission: RE | Disposition: A | Discharge status: Home / Self Care | Payer: Self-pay | Source: Ambulatory Visit | Attending: Orthopedic Surgery

## 2013-08-05 ENCOUNTER — Encounter (HOSPITAL_COMMUNITY): Payer: Self-pay | Admitting: Certified Registered Nurse Anesthetist

## 2013-08-05 DIAGNOSIS — M7580 Other shoulder lesions, unspecified shoulder: Secondary | ICD-10-CM

## 2013-08-05 DIAGNOSIS — Z87891 Personal history of nicotine dependence: Secondary | ICD-10-CM | POA: Insufficient documentation

## 2013-08-05 DIAGNOSIS — N182 Chronic kidney disease, stage 2 (mild): Secondary | ICD-10-CM | POA: Insufficient documentation

## 2013-08-05 DIAGNOSIS — G4730 Sleep apnea, unspecified: Secondary | ICD-10-CM | POA: Insufficient documentation

## 2013-08-05 DIAGNOSIS — M75122 Complete rotator cuff tear or rupture of left shoulder, not specified as traumatic: Secondary | ICD-10-CM

## 2013-08-05 DIAGNOSIS — M75102 Unspecified rotator cuff tear or rupture of left shoulder, not specified as traumatic: Secondary | ICD-10-CM | POA: Diagnosis present

## 2013-08-05 DIAGNOSIS — I129 Hypertensive chronic kidney disease with stage 1 through stage 4 chronic kidney disease, or unspecified chronic kidney disease: Secondary | ICD-10-CM | POA: Insufficient documentation

## 2013-08-05 DIAGNOSIS — W010XXA Fall on same level from slipping, tripping and stumbling without subsequent striking against object, initial encounter: Secondary | ICD-10-CM | POA: Insufficient documentation

## 2013-08-05 DIAGNOSIS — R51 Headache: Secondary | ICD-10-CM | POA: Insufficient documentation

## 2013-08-05 DIAGNOSIS — Z79899 Other long term (current) drug therapy: Secondary | ICD-10-CM | POA: Insufficient documentation

## 2013-08-05 DIAGNOSIS — I499 Cardiac arrhythmia, unspecified: Secondary | ICD-10-CM | POA: Insufficient documentation

## 2013-08-05 DIAGNOSIS — Z7982 Long term (current) use of aspirin: Secondary | ICD-10-CM | POA: Insufficient documentation

## 2013-08-05 DIAGNOSIS — M6740 Ganglion, unspecified site: Secondary | ICD-10-CM | POA: Insufficient documentation

## 2013-08-05 DIAGNOSIS — M25819 Other specified joint disorders, unspecified shoulder: Secondary | ICD-10-CM | POA: Insufficient documentation

## 2013-08-05 DIAGNOSIS — Z6841 Body Mass Index (BMI) 40.0 and over, adult: Secondary | ICD-10-CM | POA: Insufficient documentation

## 2013-08-05 DIAGNOSIS — S43429A Sprain of unspecified rotator cuff capsule, initial encounter: Principal | ICD-10-CM | POA: Insufficient documentation

## 2013-08-05 HISTORY — PX: SHOULDER OPEN ROTATOR CUFF REPAIR: SHX2407

## 2013-08-05 SURGERY — REPAIR, ROTATOR CUFF, OPEN
Anesthesia: General | Site: Shoulder | Laterality: Left

## 2013-08-05 MED ORDER — LIDOCAINE HCL (CARDIAC) 20 MG/ML IV SOLN
INTRAVENOUS | Status: AC
  Filled 2013-08-05: qty 5

## 2013-08-05 MED ORDER — METHOCARBAMOL 500 MG PO TABS: 500.0000 mg | ORAL_TABLET | Freq: Four times a day (QID) | ORAL | Status: DC | PRN

## 2013-08-05 MED ORDER — PHENYLEPHRINE HCL 10 MG/ML IJ SOLN
INTRAMUSCULAR | Status: AC
  Filled 2013-08-05: qty 1

## 2013-08-05 MED ORDER — LACTATED RINGERS IV SOLN
INTRAVENOUS | Status: DC
  Administered 2013-08-05: 1000 mL via INTRAVENOUS
  Administered 2013-08-05: 14:00:00 via INTRAVENOUS

## 2013-08-05 MED ORDER — PHENOL 1.4 % MT LIQD
1.0000 | OROMUCOSAL | Status: DC | PRN
  Filled 2013-08-05: qty 177

## 2013-08-05 MED ORDER — MENTHOL 3 MG MT LOZG
1.0000 | LOZENGE | OROMUCOSAL | Status: DC | PRN
  Filled 2013-08-05: qty 9

## 2013-08-05 MED ORDER — GLYCOPYRROLATE 0.2 MG/ML IJ SOLN
INTRAMUSCULAR | Status: DC | PRN
  Administered 2013-08-05: .8 mg via INTRAVENOUS

## 2013-08-05 MED ORDER — LISINOPRIL 20 MG PO TABS
20.0000 mg | ORAL_TABLET | Freq: Every day | ORAL | Status: DC
  Administered 2013-08-05 – 2013-08-06 (×2): 20 mg via ORAL
  Filled 2013-08-05 (×2): qty 1

## 2013-08-05 MED ORDER — LISINOPRIL-HYDROCHLOROTHIAZIDE 20-25 MG PO TABS: 1.0000 | ORAL_TABLET | Freq: Every day | ORAL | Status: DC

## 2013-08-05 MED ORDER — THROMBIN 5000 UNITS EX SOLR
CUTANEOUS | Status: AC
  Filled 2013-08-05: qty 5000

## 2013-08-05 MED ORDER — MEPERIDINE HCL 50 MG/ML IJ SOLN: 6.2500 mg | INTRAMUSCULAR | Status: DC | PRN

## 2013-08-05 MED ORDER — FENTANYL CITRATE 0.05 MG/ML IJ SOLN
INTRAMUSCULAR | Status: DC | PRN
  Administered 2013-08-05 (×2): 50 ug via INTRAVENOUS
  Administered 2013-08-05: 100 ug via INTRAVENOUS
  Administered 2013-08-05: 50 ug via INTRAVENOUS

## 2013-08-05 MED ORDER — OXYCODONE-ACETAMINOPHEN 5-325 MG PO TABS
1.0000 | ORAL_TABLET | ORAL | Status: DC | PRN
  Administered 2013-08-05 – 2013-08-06 (×3): 2 via ORAL
  Filled 2013-08-05 (×3): qty 2

## 2013-08-05 MED ORDER — GLYCOPYRROLATE 0.2 MG/ML IJ SOLN
INTRAMUSCULAR | Status: AC
  Filled 2013-08-05: qty 4

## 2013-08-05 MED ORDER — POLYETHYLENE GLYCOL 3350 17 G PO PACK: 17.0000 g | PACK | Freq: Every day | ORAL | Status: DC | PRN

## 2013-08-05 MED ORDER — PROPOFOL 10 MG/ML IV BOLUS
INTRAVENOUS | Status: AC
  Filled 2013-08-05: qty 20

## 2013-08-05 MED ORDER — ACETAMINOPHEN 650 MG RE SUPP: 650.0000 mg | Freq: Four times a day (QID) | RECTAL | Status: DC | PRN

## 2013-08-05 MED ORDER — LACTATED RINGERS IV SOLN
INTRAVENOUS | Status: DC
  Administered 2013-08-05: 20:00:00 via INTRAVENOUS

## 2013-08-05 MED ORDER — DEXAMETHASONE SODIUM PHOSPHATE 10 MG/ML IJ SOLN
INTRAMUSCULAR | Status: DC | PRN
  Administered 2013-08-05: 10 mg via INTRAVENOUS

## 2013-08-05 MED ORDER — ACETAMINOPHEN 325 MG PO TABS: 650.0000 mg | ORAL_TABLET | Freq: Four times a day (QID) | ORAL | Status: DC | PRN

## 2013-08-05 MED ORDER — EPHEDRINE SULFATE 50 MG/ML IJ SOLN
INTRAMUSCULAR | Status: AC
  Filled 2013-08-05: qty 1

## 2013-08-05 MED ORDER — FENTANYL CITRATE 0.05 MG/ML IJ SOLN
25.0000 ug | INTRAMUSCULAR | Status: DC | PRN
  Administered 2013-08-05 (×2): 50 ug via INTRAVENOUS

## 2013-08-05 MED ORDER — SODIUM CHLORIDE 0.9 % IR SOLN
Status: DC | PRN
  Administered 2013-08-05: 14:00:00

## 2013-08-05 MED ORDER — LIDOCAINE HCL (CARDIAC) 20 MG/ML IV SOLN
INTRAVENOUS | Status: DC | PRN
  Administered 2013-08-05: 100 mg via INTRAVENOUS

## 2013-08-05 MED ORDER — ACETAMINOPHEN 10 MG/ML IV SOLN
1000.0000 mg | Freq: Once | INTRAVENOUS | Status: AC
  Administered 2013-08-05: 1000 mg via INTRAVENOUS
  Filled 2013-08-05: qty 100

## 2013-08-05 MED ORDER — DEXAMETHASONE SODIUM PHOSPHATE 10 MG/ML IJ SOLN
INTRAMUSCULAR | Status: AC
  Filled 2013-08-05: qty 1

## 2013-08-05 MED ORDER — FENTANYL CITRATE 0.05 MG/ML IJ SOLN
INTRAMUSCULAR | Status: AC
  Filled 2013-08-05: qty 2

## 2013-08-05 MED ORDER — ONDANSETRON HCL 4 MG PO TABS: 4.0000 mg | ORAL_TABLET | Freq: Four times a day (QID) | ORAL | Status: DC | PRN

## 2013-08-05 MED ORDER — ATROPINE SULFATE 0.4 MG/ML IJ SOLN
INTRAMUSCULAR | Status: AC
  Filled 2013-08-05: qty 1

## 2013-08-05 MED ORDER — PHENYLEPHRINE HCL 10 MG/ML IJ SOLN
10.0000 mg | INTRAVENOUS | Status: DC | PRN
  Administered 2013-08-05: 40 ug/min via INTRAVENOUS

## 2013-08-05 MED ORDER — SUCCINYLCHOLINE CHLORIDE 20 MG/ML IJ SOLN
INTRAMUSCULAR | Status: DC | PRN
  Administered 2013-08-05: 140 mg via INTRAVENOUS

## 2013-08-05 MED ORDER — FENTANYL CITRATE 0.05 MG/ML IJ SOLN
INTRAMUSCULAR | Status: AC
  Filled 2013-08-05: qty 5

## 2013-08-05 MED ORDER — ROCURONIUM BROMIDE 100 MG/10ML IV SOLN
INTRAVENOUS | Status: DC | PRN
  Administered 2013-08-05: 50 mg via INTRAVENOUS

## 2013-08-05 MED ORDER — CLINDAMYCIN PHOSPHATE 600 MG/50ML IV SOLN
600.0000 mg | Freq: Four times a day (QID) | INTRAVENOUS | Status: AC
  Administered 2013-08-05 – 2013-08-06 (×3): 600 mg via INTRAVENOUS
  Filled 2013-08-05 (×4): qty 50

## 2013-08-05 MED ORDER — HYDROCHLOROTHIAZIDE 25 MG PO TABS
25.0000 mg | ORAL_TABLET | Freq: Every day | ORAL | Status: DC
  Administered 2013-08-05 – 2013-08-06 (×2): 25 mg via ORAL
  Filled 2013-08-05 (×2): qty 1

## 2013-08-05 MED ORDER — SODIUM CHLORIDE 0.9 % IJ SOLN
INTRAMUSCULAR | Status: AC
  Filled 2013-08-05: qty 10

## 2013-08-05 MED ORDER — MIDAZOLAM HCL 5 MG/5ML IJ SOLN
INTRAMUSCULAR | Status: DC | PRN
  Administered 2013-08-05: 2 mg via INTRAVENOUS

## 2013-08-05 MED ORDER — PHENYLEPHRINE 40 MCG/ML (10ML) SYRINGE FOR IV PUSH (FOR BLOOD PRESSURE SUPPORT)
PREFILLED_SYRINGE | INTRAVENOUS | Status: AC
  Filled 2013-08-05: qty 10

## 2013-08-05 MED ORDER — HYDROMORPHONE HCL PF 1 MG/ML IJ SOLN
0.5000 mg | INTRAMUSCULAR | Status: DC | PRN
  Administered 2013-08-05: 1 mg via INTRAVENOUS
  Filled 2013-08-05: qty 1

## 2013-08-05 MED ORDER — FLEET ENEMA 7-19 GM/118ML RE ENEM: 1.0000 | ENEMA | Freq: Once | RECTAL | Status: AC | PRN

## 2013-08-05 MED ORDER — BISACODYL 5 MG PO TBEC: 5.0000 mg | DELAYED_RELEASE_TABLET | Freq: Every day | ORAL | Status: DC | PRN

## 2013-08-05 MED ORDER — ROCURONIUM BROMIDE 100 MG/10ML IV SOLN
INTRAVENOUS | Status: AC
  Filled 2013-08-05: qty 1

## 2013-08-05 MED ORDER — BUPIVACAINE LIPOSOME 1.3 % IJ SUSP
20.0000 mL | Freq: Once | INTRAMUSCULAR | Status: AC
  Administered 2013-08-05: 20 mL
  Filled 2013-08-05: qty 20

## 2013-08-05 MED ORDER — THROMBIN 5000 UNITS EX SOLR
CUTANEOUS | Status: DC | PRN
  Administered 2013-08-05: 5000 [IU] via TOPICAL

## 2013-08-05 MED ORDER — NEOSTIGMINE METHYLSULFATE 10 MG/10ML IV SOLN
INTRAVENOUS | Status: DC | PRN
  Administered 2013-08-05: 5 mg via INTRAVENOUS

## 2013-08-05 MED ORDER — DEXTROSE 5 % IV SOLN
500.0000 mg | Freq: Four times a day (QID) | INTRAVENOUS | Status: DC | PRN
  Administered 2013-08-05: 500 mg via INTRAVENOUS
  Filled 2013-08-05: qty 5

## 2013-08-05 MED ORDER — ONDANSETRON HCL 4 MG/2ML IJ SOLN: 4.0000 mg | Freq: Four times a day (QID) | INTRAMUSCULAR | Status: DC | PRN

## 2013-08-05 MED ORDER — CLINDAMYCIN PHOSPHATE 900 MG/50ML IV SOLN
900.0000 mg | INTRAVENOUS | Status: AC
  Administered 2013-08-05: 900 mg via INTRAVENOUS

## 2013-08-05 MED ORDER — ONDANSETRON HCL 4 MG/2ML IJ SOLN
INTRAMUSCULAR | Status: DC | PRN
  Administered 2013-08-05: 4 mg via INTRAVENOUS

## 2013-08-05 MED ORDER — ASPIRIN 325 MG PO TABS
325.0000 mg | ORAL_TABLET | ORAL | Status: DC | PRN
  Filled 2013-08-05: qty 1

## 2013-08-05 MED ORDER — PROMETHAZINE HCL 25 MG/ML IJ SOLN: 6.2500 mg | INTRAMUSCULAR | Status: DC | PRN

## 2013-08-05 MED ORDER — LACTATED RINGERS IV SOLN: INTRAVENOUS | Status: DC

## 2013-08-05 MED ORDER — ONDANSETRON HCL 4 MG/2ML IJ SOLN
INTRAMUSCULAR | Status: AC
  Filled 2013-08-05: qty 2

## 2013-08-05 MED ORDER — PROPOFOL 10 MG/ML IV BOLUS
INTRAVENOUS | Status: DC | PRN
  Administered 2013-08-05: 200 mg via INTRAVENOUS
  Administered 2013-08-05: 50 mg via INTRAVENOUS

## 2013-08-05 MED ORDER — MIDAZOLAM HCL 2 MG/2ML IJ SOLN
INTRAMUSCULAR | Status: AC
  Filled 2013-08-05: qty 2

## 2013-08-05 MED ORDER — CLINDAMYCIN PHOSPHATE 900 MG/50ML IV SOLN
INTRAVENOUS | Status: AC
  Filled 2013-08-05: qty 50

## 2013-08-05 SURGICAL SUPPLY — 49 items
ADH SKN CLS APL DERMABOND .7 (GAUZE/BANDAGES/DRESSINGS) ×1
BAG SPEC THK2 15X12 ZIP CLS (MISCELLANEOUS)
BAG ZIPLOCK 12X15 (MISCELLANEOUS) IMPLANT
BLADE OSCILLATING/SAGITTAL (BLADE) ×3
BLADE SW THK.38XMED LNG THN (BLADE) ×1 IMPLANT
BNDG COHESIVE 6X5 TAN NS LF (GAUZE/BANDAGES/DRESSINGS) ×3 IMPLANT
BUR OVAL CARBIDE 4.0 (BURR) ×3 IMPLANT
CLEANER TIP ELECTROSURG 2X2 (MISCELLANEOUS) ×3 IMPLANT
DERMABOND ADVANCED (GAUZE/BANDAGES/DRESSINGS) ×2
DERMABOND ADVANCED .7 DNX12 (GAUZE/BANDAGES/DRESSINGS) ×1 IMPLANT
DERMASPAN .5-.9MM 4X4CM SHOU (Miscellaneous) ×2 IMPLANT
DRAPE POUCH INSTRU U-SHP 10X18 (DRAPES) ×3 IMPLANT
DRSG AQUACEL AG ADV 3.5X 4 (GAUZE/BANDAGES/DRESSINGS) ×2 IMPLANT
DRSG AQUACEL AG ADV 3.5X 6 (GAUZE/BANDAGES/DRESSINGS) ×3 IMPLANT
DURAPREP 26ML APPLICATOR (WOUND CARE) ×3 IMPLANT
ELECT REM PT RETURN 9FT ADLT (ELECTROSURGICAL) ×3
ELECTRODE REM PT RTRN 9FT ADLT (ELECTROSURGICAL) ×1 IMPLANT
GLOVE BIOGEL PI IND STRL 6.5 (GLOVE) ×1 IMPLANT
GLOVE BIOGEL PI IND STRL 8 (GLOVE) ×1 IMPLANT
GLOVE BIOGEL PI INDICATOR 6.5 (GLOVE) ×2
GLOVE BIOGEL PI INDICATOR 8 (GLOVE) ×2
GLOVE ECLIPSE 8.0 STRL XLNG CF (GLOVE) ×3 IMPLANT
GLOVE SURG SS PI 6.5 STRL IVOR (GLOVE) ×3 IMPLANT
GOWN STRL REUS W/TWL LRG LVL3 (GOWN DISPOSABLE) ×3 IMPLANT
GOWN STRL REUS W/TWL XL LVL3 (GOWN DISPOSABLE) ×3 IMPLANT
KIT BASIN OR (CUSTOM PROCEDURE TRAY) ×3 IMPLANT
KIT POSITION SHOULDER SCHLEI (MISCELLANEOUS) ×3 IMPLANT
MANIFOLD NEPTUNE II (INSTRUMENTS) ×3 IMPLANT
NDL MA TROC 1/2 (NEEDLE) IMPLANT
NEEDLE MA TROC 1/2 (NEEDLE) IMPLANT
NS IRRIG 1000ML POUR BTL (IV SOLUTION) IMPLANT
PACK SHOULDER CUSTOM OPM052 (CUSTOM PROCEDURE TRAY) ×3 IMPLANT
POSITIONER SURGICAL ARM (MISCELLANEOUS) ×3 IMPLANT
SLING ARM IMMOBILIZER LRG (SOFTGOODS) ×3 IMPLANT
SLING ARM XL FOAM STRAP (SOFTGOODS) ×2 IMPLANT
SPONGE LAP 4X18 X RAY DECT (DISPOSABLE) ×4 IMPLANT
SPONGE SURGIFOAM ABS GEL 100 (HEMOSTASIS) ×3 IMPLANT
STAPLER VISISTAT 35W (STAPLE) IMPLANT
SUCTION FRAZIER 12FR DISP (SUCTIONS) ×3 IMPLANT
SUT BONE WAX W31G (SUTURE) ×3 IMPLANT
SUT ETHIBOND NAB CT1 #1 30IN (SUTURE) ×5 IMPLANT
SUT MNCRL AB 4-0 PS2 18 (SUTURE) ×3 IMPLANT
SUT VIC AB 0 CT1 27 (SUTURE)
SUT VIC AB 0 CT1 27XBRD ANTBC (SUTURE) IMPLANT
SUT VIC AB 1 CT1 27 (SUTURE) ×6
SUT VIC AB 1 CT1 27XBRD ANTBC (SUTURE) ×2 IMPLANT
SUT VIC AB 2-0 CT1 27 (SUTURE) ×9
SUT VIC AB 2-0 CT1 27XBRD (SUTURE) ×2 IMPLANT
TOWEL OR 17X26 10 PK STRL BLUE (TOWEL DISPOSABLE) ×3 IMPLANT

## 2013-08-05 NOTE — Transfer of Care (Signed)
Immediate Anesthesia Transfer of Care Note  Patient: [name]  Procedure(s) Performed: Procedure(s) (LRB):  LEFT OPEN ACHROMIONECTOMY, ROTATOR CUFF REPAIR WITH GRAFT (Left)  Patient Location: PACU  Anesthesia Type: General  Level of Consciousness: sedated, patient cooperative and responds to stimulation  Airway & Oxygen Therapy: Patient Spontanous Breathing and Patient connected to face mask oxgen  Post-op Assessment: Report given to PACU RN and Post -op Vital signs reviewed and stable  Post vital signs: Reviewed and stable  Complications: No apparent anesthesia complications

## 2013-08-05 NOTE — H&P (Signed)
[name] is an 68 y.o. male.   Chief Complaint: Pain in Left Shoulder and loss of Motion HPI: Continuous pain and loss of strength in Left shoulder.  Past Medical History  Diagnosis Date  . Hypertension   . Headache(784.0)   . Arthritis     gouty  . Vertigo 2012    takes "med" every other day  . Sore neck     d/t recent fall in tub  . Arrhythmia     pt doesnt know type  . Gastric ulcer, acute without hemorrhage or perforation 02/26/2013  . Synovitis   . CKD (chronic kidney disease), stage II   . Sleep apnea     not used in 2 years   . Shortness of breath     due to weight   . Anemia     hx of bleeding ulcer     Past Surgical History  Procedure Laterality Date  . Shoulder surgery Right 2009  . 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   . Back cyst removal  1992  . Knee arthroscopy Left 2001  . Circumcision  07/04/2011    Procedure: CIRCUMCISION ADULT;  Surgeon: Malka So, MD;  Location: [facility];  Service: Urology;  Laterality: N/A;  . Cardiac catheterization  >5 years.  . Umbilical hernia repair  03/14/2012    Procedure: HERNIA REPAIR UMBILICAL ADULT;  Surgeon: Haywood Lasso, MD;  Location: [place];  Service: General;  Laterality: N/A;  . Insertion of mesh  03/14/2012    Procedure: INSERTION OF MESH;  Surgeon: Haywood Lasso, MD;  Location: [place];  Service: General;  Laterality: N/A;  . Esophagogastroduodenoscopy N/A 02/26/2013    Procedure: ESOPHAGOGASTRODUODENOSCOPY (EGD);  Surgeon: Gatha Mayer, MD;  Location: [facility];  Service: Endoscopy;  Laterality: N/A;  . Fluid removed from left knee surgery     . Colonoscopy      Family History  Problem Relation Age of Onset  . Heart disease Mother   . Heart disease Father   . Colon cancer Maternal Uncle   . Diabetes Mother   . Diabetes Sister   . Kidney disease Father   . Cirrhosis Father     not alcohol related  . Emphysema Father   . Heart disease Brother     x 2  .  Heart attack Brother   . Stroke Mother    Social History:  reports that he quit smoking about 33 years ago. His smoking use included Cigarettes. He has a 4 pack-year smoking history. He has never used smokeless tobacco. He reports that he drinks alcohol. He reports that he does not use illicit drugs.  Allergies:  Allergies  Allergen Reactions  . Advil [Ibuprofen] Hives  . Amoxicillin Swelling  . Morphine And Related Nausea And Vomiting    vicodin is OK    Medications Prior to Admission  Medication Sig Dispense Refill  . aspirin 325 MG tablet Take 325 mg by mouth every 4 (four) hours as needed for headache.      Marland Kitchen HYDROcodone-acetaminophen (NORCO/VICODIN) 5-325 MG per tablet Take 2 tablets by mouth every 4 (four) hours as needed for moderate pain.  30 tablet  0  . lisinopril-hydrochlorothiazide (PRINZIDE,ZESTORETIC) 20-25 MG per tablet Take 1 tablet by mouth daily.      . Multiple Vitamin (MULTIVITAMIN WITH MINERALS) TABS tablet Take 1 tablet by mouth daily.        No results  found for this or any previous visit (from the past 48 hour(s)). No results found.  Review of Systems  Constitutional: Negative.   Eyes:       Eye motor weakness  Respiratory: Negative.   Cardiovascular: Negative.   Gastrointestinal: Negative.   Genitourinary: Negative.   Musculoskeletal: Positive for joint pain.  Skin: Negative.   Neurological: Negative.   Endo/Heme/Allergies: Negative.   Psychiatric/Behavioral: Negative.     Blood pressure 166/82, pulse 80, temperature 97.6 F (36.4 C), temperature source Oral, resp. rate 18, SpO2 93.00%. Physical Exam  Constitutional: He appears well-developed.  HENT:  Head: Normocephalic.  Eyes:  Motor weakness of occular muscles  Neck: Normal range of motion.  Cardiovascular: Normal rate.   Respiratory: Effort normal.  GI: Soft.  Musculoskeletal:  Tear of Left Rotator cuff  Neurological: He is alert.  Skin: Skin is warm.  Psychiatric: He has a normal  mood and affect.     Assessment/Plan Open repair of Left Rotator Cuff.  [name] Josephine Rudnick 08/05/2013, 12:42 PM

## 2013-08-05 NOTE — Anesthesia Preprocedure Evaluation (Signed)
Anesthesia Evaluation  Patient identified by MRN, date of birth, ID band Patient awake    Reviewed: Allergy & Precautions, H&P , NPO status , Patient's Chart, lab work & pertinent test results  History of Anesthesia Complications Negative for: history of anesthetic complications  Airway Mallampati: II TM Distance: >3 FB Neck ROM: Full    Dental  (+) Teeth Intact, Dental Advisory Given, Poor Dentition   Pulmonary sleep apnea and Continuous Positive Airway Pressure Ventilation , former smoker,  breath sounds clear to auscultation  Pulmonary exam normal       Cardiovascular hypertension, Rhythm:Irregular Rate:Normal     Neuro/Psych  Headaches, Anxiety    GI/Hepatic negative GI ROS, Neg liver ROS,   Endo/Other  negative endocrine ROSMorbid obesity  Renal/GU negative Renal ROS     Musculoskeletal negative musculoskeletal ROS (+)   Abdominal (+)  Abdomen: soft.    Peds  Hematology negative hematology ROS (+)   Anesthesia Other Findings   Reproductive/Obstetrics negative OB ROS                           Anesthesia Physical  Anesthesia Plan  ASA: III  Anesthesia Plan: General   Post-op Pain Management:    Induction: Intravenous  Airway Management Planned: Oral ETT  Additional Equipment:   Intra-op Plan:   Post-operative Plan: Extubation in OR  Informed Consent: I have reviewed the patients History and Physical, chart, labs and discussed the procedure including the risks, benefits and alternatives for the proposed anesthesia with the patient or authorized representative who has indicated his/her understanding and acceptance.   Dental advisory given  Plan Discussed with: CRNA, Anesthesiologist and Surgeon  Anesthesia Plan Comments:         Anesthesia Quick Evaluation

## 2013-08-05 NOTE — Brief Op Note (Signed)
08/05/2013  2:07 PM  PATIENT:  [name]  68 y.o. male  PRE-OPERATIVE DIAGNOSIS:  left shoulder rotator cuff tear   POST-OPERATIVE DIAGNOSIS:  left shoulder rotator cuff tear with Large Ganglion Cyst  PROCEDURE:  Procedure(s):  LEFT OPEN ACHROMIONECTOMY, ROTATOR CUFF REPAIR WITH GRAFT (Left) and Excision of Ganglion Cyst  SURGEON:  Surgeon(s) and Role:    * Tobi Bastos, MD - Primary  PHYSICIAN ASSISTANT: Ardeen Jourdain PA  ASSISTANTS: Ardeen Jourdain PA  ANESTHESIA:   general  EBL:     BLOOD ADMINISTERED:none  DRAINS: none   LOCAL MEDICATIONS USED:  BUPIVICAINE 20cc  SPECIMEN:  No Specimen  DISPOSITION OF SPECIMEN:  N/A  COUNTS:  YES  TOURNIQUET:  * No tourniquets in log *  DICTATION: .Other Dictation: Dictation Number [id number]  PLAN OF CARE: Admit for overnight observation  PATIENT DISPOSITION:  Stable in OR   Delay start of Pharmacological VTE agent (>24hrs) due to surgical blood loss or risk of bleeding: yes

## 2013-08-05 NOTE — Interval H&P Note (Signed)
History and Physical Interval Note:  08/05/2013 12:48 PM  Raynelle Fanning  has presented today for surgery, with the diagnosis of left shoulder rotator cuff tear  The various methods of treatment have been discussed with the patient and family. After consideration of risks, benefits and other options for treatment, the patient has consented to  Procedure(s): [place] (Left) as a surgical intervention .  The patient's history has been reviewed, patient examined, no change in status, stable for surgery.  I have reviewed the patient's chart and labs.  Questions were answered to the patient's satisfaction.     Tobi Bastos

## 2013-08-06 MED ORDER — METHOCARBAMOL 500 MG PO TABS: 500.0000 mg | ORAL_TABLET | Freq: Four times a day (QID) | ORAL | Status: AC | PRN

## 2013-08-06 MED ORDER — OXYCODONE-ACETAMINOPHEN 5-325 MG PO TABS: 1.0000 | ORAL_TABLET | ORAL | Status: AC | PRN

## 2013-08-06 NOTE — Op Note (Signed)
[name], [name]                 ACCOUNT NO.:  [id number]  MEDICAL RECORD NO.:  [id number]  LOCATION:  [id number]                         FACILITY:  [facility]  PHYSICIAN:  [name]. Karelly Dewalt, M.D.DATE OF BIRTH:  08/25/1945  DATE OF PROCEDURE:  08/05/2013 DATE OF DISCHARGE:                              OPERATIVE REPORT   SURGEON:  [name]. Rayann Jolley, MD  OPERATIVE ASSISTANT:  Ardeen Jourdain, PA  PREOPERATIVE DIAGNOSES: 1. Tear of the rotator cuff tendon, left shoulder. 2. Severe impingement syndrome, left shoulder.  POSTOPERATIVE DIAGNOSES: 1. Tear of the rotator cuff tendon, left shoulder. 2. Severe impingement syndrome, left shoulder.  OPERATION: 1. Open acromionectomy and acromioplasty, left shoulder. 2. Excision of a large ganglion cyst involving the rotator cuff, left     shoulder. 3. Repair of a complete tear of the rotator cuff, left shoulder. 4. Graft, left shoulder utilizing the DermaSpan graft.  No anchors     were used.  DESCRIPTION OF PROCEDURE:  Under general anesthesia with the patient in the beach chair position sterile prepping and draping of the left shoulder was carried out.  He had 900 mg of clindamycin IV.  Note, the appropriate time-out was carried out.  I also marked the appropriate left arm in the holding area.  At this time, an incision was made over the anterior aspect of the left shoulder.  Bleeders were identified and cauterized.  I then separated the deltoid tendon muscle in usual fashion from the acromion.  I split a small proximal part of the deltoid muscle. I then went down and exposed a severe thickened downsloped acromion.  I protected the underlying cuff with a Bennett retractor, did an acromionectomy with the oscillating saw and acromioplasty with the bur. I then irrigated the area out and bone waxed the undersurface of the acromion.  Following that, I went down and identified the rotator cuff. He had extremely large longitudinal ganglion type cyst  which I excised and then repaired the rotator cuff tendon with a side-to-side suture. Following that, I reinforced the repair site with a DermaSpan graft.  I thoroughly irrigated out the area.  As I said, we bone waxed the undersurface of the acromion.  I inserted some thrombin-soaked Gelfoam and closed the wound in layers in usual fashion after approximating the deltoid tendon muscle.  We then injected 20 mL of Exparel into the soft tissue.  Sterile dressings were applied.  The patient was placed in a shoulder immobilizer.          ______________________________ [name] [name], M.D.     RAG/MEDQ  D:  08/05/2013  T:  08/06/2013  Job:  [id number]

## 2013-08-06 NOTE — Anesthesia Postprocedure Evaluation (Signed)
  Anesthesia Post-op Note  Patient: [name]  Procedure(s) Performed: Procedure(s) (LRB):  LEFT OPEN ACHROMIONECTOMY, ROTATOR CUFF REPAIR WITH GRAFT (Left)  Patient Location: PACU  Anesthesia Type: General  Level of Consciousness: awake and alert   Airway and Oxygen Therapy: Patient Spontanous Breathing  Post-op Pain: mild  Post-op Assessment: Post-op Vital signs reviewed, Patient's Cardiovascular Status Stable, Respiratory Function Stable, Patent Airway and No signs of Nausea or vomiting  Last Vitals:  Filed Vitals:   08/06/13 1103  BP:   Pulse: 71  Temp: 37 C  Resp: 16    Post-op Vital Signs: stable   Complications: No apparent anesthesia complications

## 2013-08-06 NOTE — Plan of Care (Signed)
Problem: Discharge Progression Outcomes Goal: Anticoagulant follow-up in place Outcome: Not Applicable Date Met:  63/84/66 No anticoagulant

## 2013-08-06 NOTE — Evaluation (Signed)
Occupational Therapy Evaluation Patient Details Name: [name] MRN: [id number] DOB: September 03, 1945 Today's Date: 08/06/2013    History of Present Illness Pt admitted on 08/05/13 for L rotator cuff surgery. PMH signficant for HTN, gouty arthritis, vertigo, arrhythmia and sleep apnea.   Clinical Impression   Pt doing well. All education completed with pt. He plans to d/c to sister's house who will be his caregiver and has assisted him in the past. No further questions by pt.    Follow Up Recommendations  No OT follow up    Equipment Recommendations  None recommended by OT    Recommendations for Other Services       Precautions / Restrictions Precautions Precautions: Shoulder Shoulder Interventions: Shoulder sling/immobilizer;Off for dressing/bathing/exercises Precaution Booklet Issued: Yes (comment) Precaution Comments: Issued shoulder care handout and reviewed all instructions with pt Required Braces or Orthoses: Sling Restrictions Weight Bearing Restrictions: Yes LUE Weight Bearing: Non weight bearing      Mobility Bed Mobility                  Transfers                      Balance                                            ADL                                               Vision                     Perception     Praxis      Pertinent Vitals/Pain 3/10 L shoulder; reposition, ice     Hand Dominance Right   Extremity/Trunk Assessment             Communication Communication Communication: No difficulties   Cognition Arousal/Alertness: Awake/alert Behavior During Therapy: WFL for tasks assessed/performed Overall Cognitive Status: Within Functional Limits for tasks assessed                     General Comments       Exercises   Other Exercises Other Exercises: elbow flexion/extension (down to lower belly and then up to mouth) X 10, wrist X 10 (limited with extension even  PTA due to arthritis) and digit flexion /extension X 10. Limited with digit flexion PTA due to arthritis also.   Shoulder Instructions Shoulder Instructions Donning/doffing shirt without moving shoulder: Patient able to independently direct caregiver Method for sponge bathing under operated UE: Patient able to independently direct caregiver Donning/doffing sling/immobilizer: Patient able to independently direct caregiver Correct positioning of sling/immobilizer: Patient able to independently direct caregiver ROM for elbow, wrist and digits of operated UE: Supervision/safety Sling wearing schedule (on at all times/off for ADL's): Independent Proper positioning of operated UE when showering: Independent Positioning of UE while sleeping: Independent;Patient able to independently direct caregiver    Home Living   Living Arrangements: Other relatives                               Additional Comments: pt will stay with his sister for awhile. She  has high toilets      Prior Functioning/Environment Level of Independence: Independent with assistive device(s)        Comments: uses cane but not all the time.    OT Diagnosis:     OT Problem List:     OT Treatment/Interventions:      OT Goals(Current goals can be found in the care plan section) Acute Rehab OT Goals Patient Stated Goal: home with sister  OT Frequency:     Barriers to D/C:            Co-evaluation              End of Session    Activity Tolerance: Patient tolerated treatment well Patient left: with call bell/phone within reach (at EOB with L UE elevated on pillows in sling)   [id number] OT Time Calculation (min): 48 min Charges:  OT General Charges $OT Visit: 1 Procedure OT Evaluation $Initial OT Evaluation Tier I: 1 Procedure OT Treatments $Self Care/Home Management : 23-37 mins $Therapeutic Exercise: 8-22 mins G-Codes: OT G-codes **NOT FOR INPATIENT CLASS** Functional Assessment  Tool Used: clinical judgement Functional Limitation: Self care Self Care Current Status [id number]): At least 20 percent but less than 40 percent impaired, limited or restricted Self Care Goal Status [id number]): At least 20 percent but less than 40 percent impaired, limited or restricted Self Care Discharge Status [phone]): At least 20 percent but less than 40 percent impaired, limited or restricted  [name] [id number] 08/06/2013, 9:58 AM

## 2013-08-06 NOTE — Discharge Instructions (Signed)
Keep your sling on at all times, including sleeping in your sling. The only time you should remove your sling is to shower only but you need to keep your hand against your chest while you shower.  Do not remove your dressing.  Call Dr. [name] if any wound complications or temperature of 101 degrees F or over.  Call the office for an appointment to see Dr. [name] in two weeks: [phone] and ask for Dr. Charlestine Night nurse, Brunilda Payor.

## 2013-08-06 NOTE — Progress Notes (Signed)
Subjective: 1 Day Post-Op Procedure(s) (LRB):  LEFT OPEN ACHROMIONECTOMY, ROTATOR CUFF REPAIR WITH GRAFT (Left) Patient reports pain as 2 on 0-10 scale. Doing well today. WillDC   Objective: Vital signs in last 24 hours: Temp:  [97.5 F (36.4 C)-98.6 F (37 C)] 97.5 F (36.4 C) (06/10 0551) Pulse Rate:  [69-87] 69 (06/10 0551) Resp:  [8-22] 16 (06/10 0551) BP: (113-185)/(62-88) 113/64 mmHg (06/10 0551) SpO2:  [91 %-99 %] 96 % (06/10 0551) Weight:  [141 kg (310 lb 13.6 oz)] 141 kg (310 lb 13.6 oz) (06/09 1900)  Intake/Output from previous day: 06/09 0701 - 06/10 0700 In: 3357.5 [P.O.:440; I.V.:2762.5; IV Piggyback:155] Out: 1250 [Urine:1250] Intake/Output this shift: Total I/O In: -  Out: 575 [Urine:575]  No results found for this basename: HGB,  in the last 72 hours No results found for this basename: WBC, RBC, HCT, PLT,  in the last 72 hours No results found for this basename: NA, K, CL, CO2, BUN, CREATININE, GLUCOSE, CALCIUM,  in the last 72 hours No results found for this basename: LABPT, INR,  in the last 72 hours  Neurologically intact  Assessment/Plan: 1 Day Post-Op Procedure(s) (LRB):  LEFT OPEN ACHROMIONECTOMY, ROTATOR CUFF REPAIR WITH GRAFT (Left) Discharge home with home health.  Keyonni Percival A 08/06/2013, 7:51 AM

## 2013-08-06 NOTE — Progress Notes (Signed)
Discharged from floor via w/c, sister with pt. No changes in assessment. [name], [employer]

## 2013-08-08 NOTE — Discharge Summary (Signed)
Physician Discharge Summary   Patient ID: [name] MRN: [id number] DOB/AGE: 67/67/1947 67 y.o.  Admit date: 08/05/2013 Discharge date: 08/06/2013  Primary Diagnosis: Left shoulder rotator cuff tear    Impingement syndrome left shoulder  Admission Diagnoses:  Past Medical History  Diagnosis Date  . Hypertension   . Headache(784.0)   . Arthritis     gouty  . Vertigo 2012    takes "med" every other day  . Sore neck     d/t recent fall in tub  . Arrhythmia     pt doesnt know type  . Gastric ulcer, acute without hemorrhage or perforation 02/26/2013  . Synovitis   . CKD (chronic kidney disease), stage II   . Sleep apnea     not used in 2 years   . Shortness of breath     due to weight   . Anemia     hx of bleeding ulcer    Discharge Diagnoses:   Active Problems:   Complete tear of left rotator cuff   Tear of left rotator cuff  Estimated body mass index is 45.88 kg/(m^2) as calculated from the following:   Height as of this encounter: _0  (1.753 m).   Weight as of this encounter: 141 kg (310 lb 13.6 oz).  Procedure:  Procedure(s) (LRB):  LEFT OPEN ACHROMIONECTOMY, ROTATOR CUFF REPAIR WITH GRAFT (Left)   Consults: None  HPI: The patient presented with the chief complaint of left shoulder pain 3 months ago. He had a fall when he slipped on ice. He continued to have pain through conservative treatment including cortisone injections and rest. MRI revealed a torn left rotator cuff.   Laboratory Data: Hospital Outpatient Visit on 07/29/2013  Component Date Value Ref Range Status  . aPTT 07/29/2013 29  24 - 37 seconds Final  . Sodium 07/29/2013 139  137 - 147 mEq/L Final  . Potassium 07/29/2013 5.0  3.7 - 5.3 mEq/L Final  . Chloride 07/29/2013 95* 96 - 112 mEq/L Final  . CO2 07/29/2013 32  19 - 32 mEq/L Final  . Glucose, Bld 07/29/2013 124* 70 - 99 mg/dL Final  . BUN 07/29/2013 27* 6 - 23 mg/dL Final  . Creatinine, Ser 07/29/2013 1.32  0.50 - 1.35 mg/dL Final  .  Calcium 07/29/2013 9.7  8.4 - 10.5 mg/dL Final  . Total Protein 07/29/2013 9.2* 6.0 - 8.3 g/dL Final  . Albumin 07/29/2013 3.8  3.5 - 5.2 g/dL Final  . AST 07/29/2013 22  0 - 37 U/L Final  . ALT 07/29/2013 16  0 - 53 U/L Final  . Alkaline Phosphatase 07/29/2013 39  39 - 117 U/L Final  . Total Bilirubin 07/29/2013 0.3  0.3 - 1.2 mg/dL Final  . GFR calc non Af Amer 07/29/2013 55* >90 mL/min Final  . GFR calc Af Amer 07/29/2013 63* >90 mL/min Final   Comment: (NOTE)                          The eGFR has been calculated using the CKD EPI equation.                          This calculation has not been validated in all clinical situations.                          eGFR's persistently <90 mL/min signify possible Chronic Kidney  Disease.  Marland Kitchen Prothrombin Time 07/29/2013 12.4  11.6 - 15.2 seconds Final  . INR 07/29/2013 0.94  0.00 - 1.49 Final  . Color, Urine 07/29/2013 YELLOW  YELLOW Final  . APPearance 07/29/2013 CLEAR  CLEAR Final  . Specific Gravity, Urine 07/29/2013 1.011  1.005 - 1.030 Final  . pH 07/29/2013 7.5  5.0 - 8.0 Final  . Glucose, UA 07/29/2013 NEGATIVE  NEGATIVE mg/dL Final  . Hgb urine dipstick 07/29/2013 NEGATIVE  NEGATIVE Final  . Bilirubin Urine 07/29/2013 NEGATIVE  NEGATIVE Final  . Ketones, ur 07/29/2013 NEGATIVE  NEGATIVE mg/dL Final  . Protein, ur 07/29/2013 30* NEGATIVE mg/dL Final  . Urobilinogen, UA 07/29/2013 0.2  0.0 - 1.0 mg/dL Final  . Nitrite 07/29/2013 NEGATIVE  NEGATIVE Final  . Leukocytes, UA 07/29/2013 NEGATIVE  NEGATIVE Final  . WBC 07/29/2013 13.9* 4.0 - 10.5 K/uL Final  . RBC 07/29/2013 5.73  4.22 - 5.81 MIL/uL Final  . Hemoglobin 07/29/2013 13.5  13.0 - 17.0 g/dL Final  . HCT 07/29/2013 43.3  39.0 - 52.0 % Final  . MCV 07/29/2013 75.6* 78.0 - 100.0 fL Final  . MCH 07/29/2013 23.6* 26.0 - 34.0 pg Final  . MCHC 07/29/2013 31.2  30.0 - 36.0 g/dL Final  . RDW 07/29/2013 20.5* 11.5 - 15.5 % Final  . Platelets 07/29/2013 390  150 -  400 K/uL Final  . Squamous Epithelial / LPF 07/29/2013 RARE  RARE Final  . WBC, UA 07/29/2013 0-2  <3 WBC/hpf Final  . Urine-Other 07/29/2013 MUCOUS PRESENT   Final     Hospital Course: [name] is a 68 y.o. who was admitted to [facility]. They were brought to the operating room on 08/05/2013 and underwent Procedure(s):  LEFT OPEN ACHROMIONECTOMY, ROTATOR CUFF REPAIR WITH GRAFT.  Patient tolerated the procedure well and was later transferred to the recovery room and then to the orthopaedic floor for postoperative care.  They were given PO and IV analgesics for pain control following their surgery.  They were given 24 hours of postoperative antibiotics of  Anti-infectives   Start     Dose/Rate Route Frequency Ordered Stop   08/05/13 1900  clindamycin (CLEOCIN) IVPB 600 mg     600 mg 100 mL/hr over 30 Minutes Intravenous Every 6 hours 08/05/13 1609 08/06/13 0705   08/05/13 1330  polymyxin B 500,000 Units, bacitracin 50,000 Units in sodium chloride irrigation 0.9 % 500 mL irrigation  Status:  Discontinued       As needed 08/05/13 1406 08/05/13 1431   08/05/13 1018  clindamycin (CLEOCIN) IVPB 900 mg     900 mg 100 mL/hr over 30 Minutes Intravenous On call to O.R. 08/05/13 1018 08/05/13 1306     and started on DVT prophylaxis in the form of Aspirin.  OT was ordered for sling and ADL instructions. Discharge planning consulted to help with postop disposition and equipment needs.  Patient had a good night on the evening of surgery.  They started to get up OOB with therapy on day one.  Patient was seen in rounds and was ready to go home.   Diet: Cardiac diet Activity:Wear sling on left arm at all times Follow-up:in 2 weeks Disposition - Home Discharged Condition: good   Discharge Instructions   Call MD / Call 911    Complete by:  As directed   If you experience chest pain or shortness of breath, CALL 911 and be transported to the hospital emergency room.  If you develope a fever  above 101 F, pus (white drainage) or increased drainage or redness at the wound, or calf pain, call your surgeon's office.     Constipation Prevention    Complete by:  As directed   Drink plenty of fluids.  Prune juice may be helpful.  You may use a stool softener, such as Colace (over the counter) 100 mg twice a day.  Use MiraLax (over the counter) for constipation as needed.     Diet - low sodium heart healthy    Complete by:  As directed      Discharge instructions    Complete by:  As directed   Keep your sling on at all times, including sleeping in your sling. The only time you should remove your sling is to shower only but you need to keep your hand against your chest while you shower.  Do not remove your dressing.  Call Dr. [name] if any wound complications or temperature of 101 degrees F or over.  Call the office for an appointment to see Dr. [name] in two weeks: [phone] and ask for Dr. Charlestine Night nurse, Brunilda Payor.     Driving restrictions    Complete by:  As directed   No driving     Increase activity slowly as tolerated    Complete by:  As directed      Lifting restrictions    Complete by:  As directed   No lifting            Medication List    STOP taking these medications       HYDROcodone-acetaminophen 5-325 MG per tablet  Commonly known as:  NORCO/VICODIN      TAKE these medications       aspirin 325 MG tablet  Take 325 mg by mouth every 4 (four) hours as needed for headache.     lisinopril-hydrochlorothiazide 20-25 MG per tablet  Commonly known as:  PRINZIDE,ZESTORETIC  Take 1 tablet by mouth daily.     methocarbamol 500 MG tablet  Commonly known as:  ROBAXIN  Take 1 tablet (500 mg total) by mouth every 6 (six) hours as needed for muscle spasms.     multivitamin with minerals Tabs tablet  Take 1 tablet by mouth daily.     oxyCODONE-acetaminophen 5-325 MG per tablet  Commonly known as:  PERCOCET/ROXICET  Take 1-2 tablets by mouth every 4  (four) hours as needed for moderate pain.           Follow-up Information   Follow up with GIOFFRE,RONALD A, MD. Schedule an appointment as soon as possible for a visit in 2 weeks.   Specialty:  Orthopedic Surgery   Contact information:   [address] [place] [address] [phone]       Signed: Ardeen Jourdain, PA-C Orthopaedic Surgery 08/08/2013, 9:18 AM

## 2014-04-07 DIAGNOSIS — S40021A Contusion of right upper arm, initial encounter: Secondary | ICD-10-CM | POA: Diagnosis not present

## 2014-04-07 DIAGNOSIS — S40011A Contusion of right shoulder, initial encounter: Secondary | ICD-10-CM | POA: Diagnosis not present

## 2014-04-07 DIAGNOSIS — M79641 Pain in right hand: Secondary | ICD-10-CM | POA: Diagnosis not present

## 2014-04-07 DIAGNOSIS — M25521 Pain in right elbow: Secondary | ICD-10-CM | POA: Diagnosis not present

## 2014-04-14 DIAGNOSIS — S40011A Contusion of right shoulder, initial encounter: Secondary | ICD-10-CM | POA: Diagnosis not present

## 2014-04-14 DIAGNOSIS — S40021A Contusion of right upper arm, initial encounter: Secondary | ICD-10-CM | POA: Diagnosis not present

## 2014-04-14 DIAGNOSIS — M19031 Primary osteoarthritis, right wrist: Secondary | ICD-10-CM | POA: Diagnosis not present

## 2014-04-14 DIAGNOSIS — M25511 Pain in right shoulder: Secondary | ICD-10-CM | POA: Diagnosis not present

## 2014-04-23 DIAGNOSIS — M25511 Pain in right shoulder: Secondary | ICD-10-CM | POA: Diagnosis not present

## 2014-04-28 DIAGNOSIS — M19031 Primary osteoarthritis, right wrist: Secondary | ICD-10-CM | POA: Diagnosis not present

## 2014-04-28 DIAGNOSIS — M25521 Pain in right elbow: Secondary | ICD-10-CM | POA: Diagnosis not present

## 2014-04-28 DIAGNOSIS — M79641 Pain in right hand: Secondary | ICD-10-CM | POA: Diagnosis not present

## 2014-05-01 DIAGNOSIS — M25521 Pain in right elbow: Secondary | ICD-10-CM | POA: Diagnosis not present

## 2014-05-19 DIAGNOSIS — G5621 Lesion of ulnar nerve, right upper limb: Secondary | ICD-10-CM | POA: Diagnosis not present

## 2014-07-28 DIAGNOSIS — R7309 Other abnormal glucose: Secondary | ICD-10-CM | POA: Diagnosis not present

## 2014-07-28 DIAGNOSIS — H6123 Impacted cerumen, bilateral: Secondary | ICD-10-CM | POA: Diagnosis not present

## 2014-07-28 DIAGNOSIS — Z1389 Encounter for screening for other disorder: Secondary | ICD-10-CM | POA: Diagnosis not present

## 2014-07-28 DIAGNOSIS — G894 Chronic pain syndrome: Secondary | ICD-10-CM | POA: Diagnosis not present

## 2014-07-28 DIAGNOSIS — I10 Essential (primary) hypertension: Secondary | ICD-10-CM | POA: Diagnosis not present

## 2014-07-28 DIAGNOSIS — Z0001 Encounter for general adult medical examination with abnormal findings: Secondary | ICD-10-CM | POA: Diagnosis not present

## 2014-07-28 DIAGNOSIS — Z6841 Body Mass Index (BMI) 40.0 and over, adult: Secondary | ICD-10-CM | POA: Diagnosis not present

## 2014-07-28 DIAGNOSIS — Z0000 Encounter for general adult medical examination without abnormal findings: Secondary | ICD-10-CM | POA: Diagnosis not present

## 2014-09-15 DIAGNOSIS — N4883 Acquired buried penis: Secondary | ICD-10-CM | POA: Diagnosis not present

## 2014-09-15 DIAGNOSIS — N471 Phimosis: Secondary | ICD-10-CM | POA: Diagnosis not present

## 2014-10-14 DIAGNOSIS — Z9989 Dependence on other enabling machines and devices: Secondary | ICD-10-CM | POA: Diagnosis not present

## 2014-10-14 DIAGNOSIS — R0602 Shortness of breath: Secondary | ICD-10-CM | POA: Diagnosis not present

## 2014-10-14 DIAGNOSIS — I10 Essential (primary) hypertension: Secondary | ICD-10-CM | POA: Diagnosis not present

## 2014-10-14 DIAGNOSIS — G4733 Obstructive sleep apnea (adult) (pediatric): Secondary | ICD-10-CM | POA: Diagnosis not present

## 2014-10-14 DIAGNOSIS — Z87891 Personal history of nicotine dependence: Secondary | ICD-10-CM | POA: Diagnosis not present

## 2014-10-14 DIAGNOSIS — R9431 Abnormal electrocardiogram [ECG] [EKG]: Secondary | ICD-10-CM | POA: Diagnosis not present

## 2014-10-14 DIAGNOSIS — E669 Obesity, unspecified: Secondary | ICD-10-CM | POA: Diagnosis not present

## 2014-10-14 DIAGNOSIS — Z01810 Encounter for preprocedural cardiovascular examination: Secondary | ICD-10-CM | POA: Diagnosis not present

## 2014-10-14 DIAGNOSIS — I4519 Other right bundle-branch block: Secondary | ICD-10-CM | POA: Diagnosis not present

## 2014-10-14 DIAGNOSIS — R002 Palpitations: Secondary | ICD-10-CM | POA: Diagnosis not present

## 2014-10-14 DIAGNOSIS — N471 Phimosis: Secondary | ICD-10-CM | POA: Diagnosis not present

## 2014-10-14 DIAGNOSIS — Z7982 Long term (current) use of aspirin: Secondary | ICD-10-CM | POA: Diagnosis not present

## 2014-10-14 DIAGNOSIS — R202 Paresthesia of skin: Secondary | ICD-10-CM | POA: Diagnosis not present

## 2014-10-14 DIAGNOSIS — Z79899 Other long term (current) drug therapy: Secondary | ICD-10-CM | POA: Diagnosis not present

## 2014-10-21 DIAGNOSIS — I10 Essential (primary) hypertension: Secondary | ICD-10-CM | POA: Diagnosis not present

## 2014-10-21 DIAGNOSIS — Z9989 Dependence on other enabling machines and devices: Secondary | ICD-10-CM | POA: Diagnosis not present

## 2014-10-21 DIAGNOSIS — I4519 Other right bundle-branch block: Secondary | ICD-10-CM | POA: Diagnosis not present

## 2014-10-21 DIAGNOSIS — E669 Obesity, unspecified: Secondary | ICD-10-CM | POA: Diagnosis not present

## 2014-10-21 DIAGNOSIS — Z79899 Other long term (current) drug therapy: Secondary | ICD-10-CM | POA: Diagnosis not present

## 2014-10-21 DIAGNOSIS — Z87891 Personal history of nicotine dependence: Secondary | ICD-10-CM | POA: Diagnosis not present

## 2014-10-21 DIAGNOSIS — N471 Phimosis: Secondary | ICD-10-CM | POA: Diagnosis not present

## 2014-10-21 DIAGNOSIS — Z7982 Long term (current) use of aspirin: Secondary | ICD-10-CM | POA: Diagnosis not present

## 2014-10-21 DIAGNOSIS — R202 Paresthesia of skin: Secondary | ICD-10-CM | POA: Diagnosis not present

## 2014-10-21 DIAGNOSIS — R002 Palpitations: Secondary | ICD-10-CM | POA: Diagnosis not present

## 2014-10-21 DIAGNOSIS — G4733 Obstructive sleep apnea (adult) (pediatric): Secondary | ICD-10-CM | POA: Diagnosis not present

## 2014-10-21 DIAGNOSIS — R0602 Shortness of breath: Secondary | ICD-10-CM | POA: Diagnosis not present

## 2015-01-05 IMAGING — CR DG CHEST 2V
2 series · 2 of 2 positions shown · non-contrast
Comparison: 06/26/2008

CLINICAL DATA: Preop, hypertension

CHEST - 2 VIEW

[view not recorded (1 of 2)]
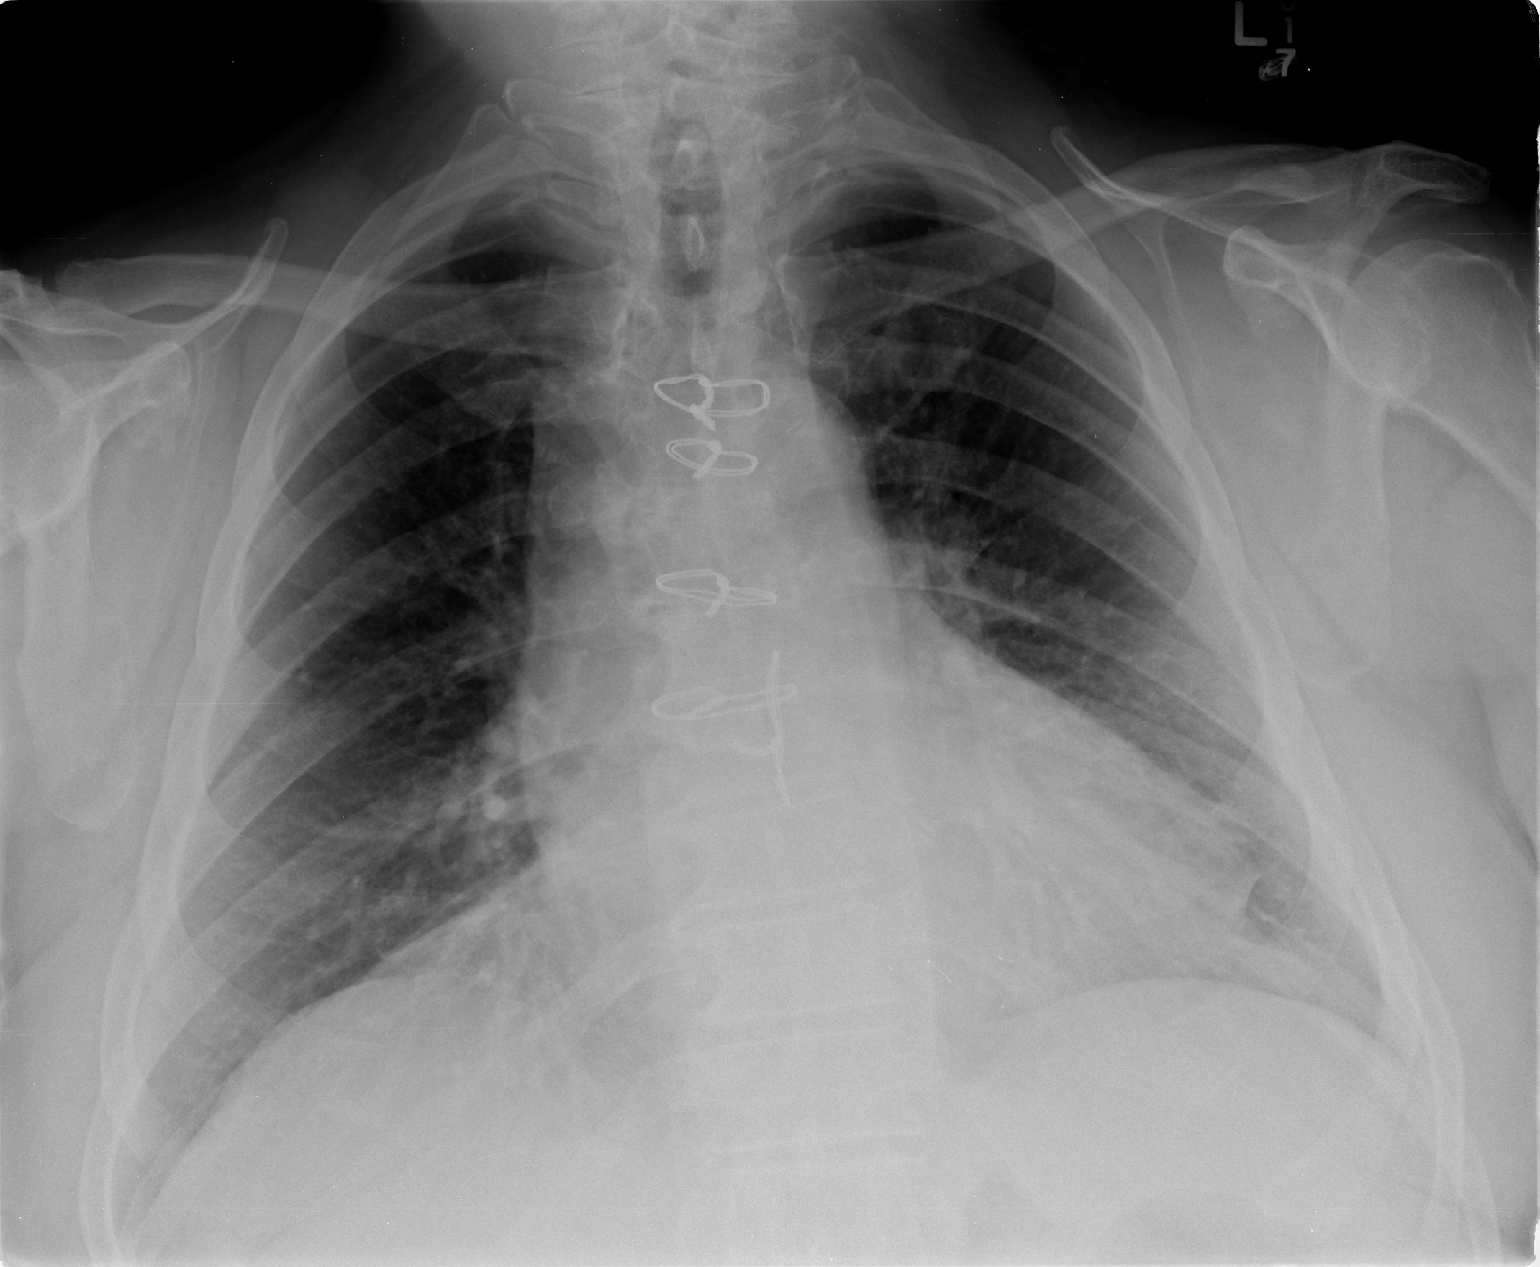

[view not recorded (2 of 2)]
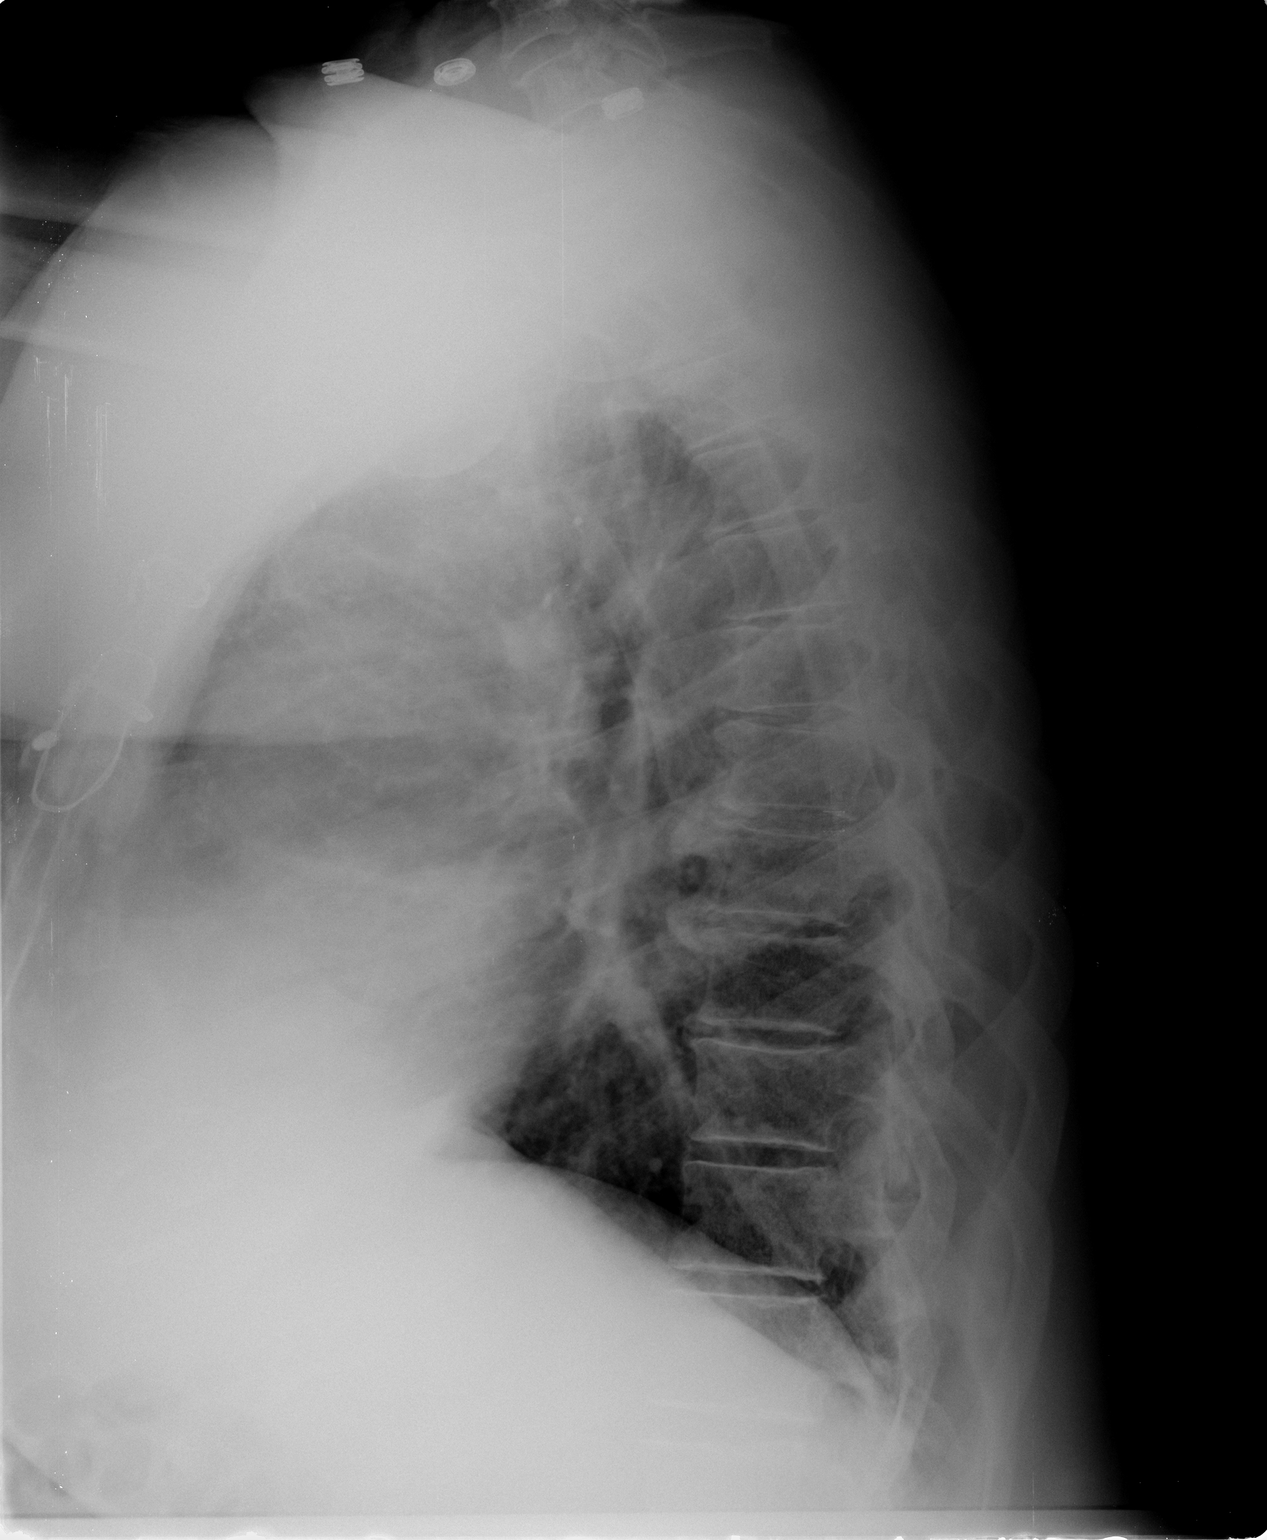

[2 of 2 positions shown; findings below may reference images not displayed]

FINDINGS: Cardiomegaly again noted.  Status post median sternotomy.
No acute infiltrate or pulmonary edema.  Stable degenerative
changes thoracic spine.
IMPRESSION: Stable cardiomegaly.  No active disease.

## 2015-02-01 DIAGNOSIS — M4806 Spinal stenosis, lumbar region: Secondary | ICD-10-CM | POA: Diagnosis not present

## 2015-02-10 DIAGNOSIS — M4806 Spinal stenosis, lumbar region: Secondary | ICD-10-CM | POA: Diagnosis not present

## 2015-02-18 DIAGNOSIS — M4806 Spinal stenosis, lumbar region: Secondary | ICD-10-CM | POA: Diagnosis not present

## 2015-03-09 DIAGNOSIS — Z1389 Encounter for screening for other disorder: Secondary | ICD-10-CM | POA: Diagnosis not present

## 2015-03-09 DIAGNOSIS — I10 Essential (primary) hypertension: Secondary | ICD-10-CM | POA: Diagnosis not present

## 2015-03-09 DIAGNOSIS — G894 Chronic pain syndrome: Secondary | ICD-10-CM | POA: Diagnosis not present

## 2015-03-30 DIAGNOSIS — M5416 Radiculopathy, lumbar region: Secondary | ICD-10-CM | POA: Diagnosis not present

## 2015-04-17 DIAGNOSIS — M4806 Spinal stenosis, lumbar region: Secondary | ICD-10-CM | POA: Diagnosis not present

## 2015-04-17 DIAGNOSIS — M25512 Pain in left shoulder: Secondary | ICD-10-CM | POA: Diagnosis not present

## 2015-04-17 DIAGNOSIS — M25522 Pain in left elbow: Secondary | ICD-10-CM | POA: Diagnosis not present

## 2015-04-17 DIAGNOSIS — M7989 Other specified soft tissue disorders: Secondary | ICD-10-CM | POA: Diagnosis not present

## 2015-06-15 DIAGNOSIS — E782 Mixed hyperlipidemia: Secondary | ICD-10-CM | POA: Diagnosis not present

## 2015-06-15 DIAGNOSIS — I10 Essential (primary) hypertension: Secondary | ICD-10-CM | POA: Diagnosis not present

## 2015-06-15 DIAGNOSIS — R7309 Other abnormal glucose: Secondary | ICD-10-CM | POA: Diagnosis not present

## 2015-06-15 DIAGNOSIS — Z1389 Encounter for screening for other disorder: Secondary | ICD-10-CM | POA: Diagnosis not present

## 2015-10-04 DIAGNOSIS — M4806 Spinal stenosis, lumbar region: Secondary | ICD-10-CM | POA: Diagnosis not present

## 2016-05-26 DIAGNOSIS — G4733 Obstructive sleep apnea (adult) (pediatric): Secondary | ICD-10-CM | POA: Diagnosis not present

## 2016-05-26 DIAGNOSIS — R011 Cardiac murmur, unspecified: Secondary | ICD-10-CM | POA: Diagnosis not present

## 2016-05-26 DIAGNOSIS — R7309 Other abnormal glucose: Secondary | ICD-10-CM | POA: Diagnosis not present

## 2016-05-26 DIAGNOSIS — Z0000 Encounter for general adult medical examination without abnormal findings: Secondary | ICD-10-CM | POA: Diagnosis not present

## 2016-05-26 DIAGNOSIS — Z1389 Encounter for screening for other disorder: Secondary | ICD-10-CM | POA: Diagnosis not present

## 2016-05-26 DIAGNOSIS — I10 Essential (primary) hypertension: Secondary | ICD-10-CM | POA: Diagnosis not present

## 2016-05-26 DIAGNOSIS — E782 Mixed hyperlipidemia: Secondary | ICD-10-CM | POA: Diagnosis not present

## 2016-05-26 DIAGNOSIS — M1040 Other secondary gout, unspecified site: Secondary | ICD-10-CM | POA: Diagnosis not present

## 2016-05-30 DIAGNOSIS — D649 Anemia, unspecified: Secondary | ICD-10-CM | POA: Diagnosis not present

## 2016-06-23 DIAGNOSIS — E875 Hyperkalemia: Secondary | ICD-10-CM | POA: Diagnosis not present

## 2016-06-23 DIAGNOSIS — N183 Chronic kidney disease, stage 3 (moderate): Secondary | ICD-10-CM | POA: Diagnosis not present

## 2016-06-23 DIAGNOSIS — G4730 Sleep apnea, unspecified: Secondary | ICD-10-CM | POA: Diagnosis not present

## 2016-06-26 DIAGNOSIS — B355 Tinea imbricata: Secondary | ICD-10-CM | POA: Diagnosis not present

## 2016-06-28 ENCOUNTER — Other Ambulatory Visit (HOSPITAL_COMMUNITY): Payer: Self-pay | Admitting: Nephrology

## 2016-06-28 ENCOUNTER — Other Ambulatory Visit (HOSPITAL_COMMUNITY): Payer: Self-pay | Admitting: Emergency Medicine

## 2016-06-28 DIAGNOSIS — N183 Chronic kidney disease, stage 3 unspecified: Secondary | ICD-10-CM

## 2016-07-10 ENCOUNTER — Ambulatory Visit (HOSPITAL_COMMUNITY)
Admission: RE | Admit: 2016-07-10 | Discharge: 2016-07-10 | Disposition: A | Discharge status: Home / Self Care | Payer: Medicare Other | Source: Ambulatory Visit | Attending: Neurology | Admitting: Neurology

## 2016-07-10 DIAGNOSIS — E559 Vitamin D deficiency, unspecified: Secondary | ICD-10-CM | POA: Diagnosis not present

## 2016-07-10 DIAGNOSIS — Z79899 Other long term (current) drug therapy: Secondary | ICD-10-CM | POA: Diagnosis not present

## 2016-07-10 DIAGNOSIS — I10 Essential (primary) hypertension: Secondary | ICD-10-CM | POA: Diagnosis not present

## 2016-07-10 DIAGNOSIS — R93421 Abnormal radiologic findings on diagnostic imaging of right kidney: Secondary | ICD-10-CM | POA: Insufficient documentation

## 2016-07-10 DIAGNOSIS — N183 Chronic kidney disease, stage 3 unspecified: Secondary | ICD-10-CM

## 2016-07-10 DIAGNOSIS — D509 Iron deficiency anemia, unspecified: Secondary | ICD-10-CM | POA: Diagnosis not present

## 2016-07-10 DIAGNOSIS — R809 Proteinuria, unspecified: Secondary | ICD-10-CM | POA: Diagnosis not present

## 2016-07-10 DIAGNOSIS — R93422 Abnormal radiologic findings on diagnostic imaging of left kidney: Secondary | ICD-10-CM | POA: Diagnosis not present

## 2016-07-10 DIAGNOSIS — N281 Cyst of kidney, acquired: Secondary | ICD-10-CM | POA: Diagnosis not present

## 2016-07-31 DIAGNOSIS — N183 Chronic kidney disease, stage 3 (moderate): Secondary | ICD-10-CM | POA: Diagnosis not present

## 2016-07-31 DIAGNOSIS — E875 Hyperkalemia: Secondary | ICD-10-CM | POA: Diagnosis not present

## 2016-07-31 DIAGNOSIS — E559 Vitamin D deficiency, unspecified: Secondary | ICD-10-CM | POA: Diagnosis not present

## 2016-07-31 DIAGNOSIS — R809 Proteinuria, unspecified: Secondary | ICD-10-CM | POA: Diagnosis not present

## 2016-08-13 DIAGNOSIS — N179 Acute kidney failure, unspecified: Secondary | ICD-10-CM | POA: Diagnosis not present

## 2016-08-13 DIAGNOSIS — M19072 Primary osteoarthritis, left ankle and foot: Secondary | ICD-10-CM | POA: Diagnosis not present

## 2016-08-13 DIAGNOSIS — E119 Type 2 diabetes mellitus without complications: Secondary | ICD-10-CM | POA: Diagnosis not present

## 2016-08-13 DIAGNOSIS — I517 Cardiomegaly: Secondary | ICD-10-CM | POA: Diagnosis not present

## 2016-08-13 DIAGNOSIS — M79662 Pain in left lower leg: Secondary | ICD-10-CM | POA: Diagnosis not present

## 2016-08-13 DIAGNOSIS — Z87891 Personal history of nicotine dependence: Secondary | ICD-10-CM | POA: Diagnosis not present

## 2016-08-13 DIAGNOSIS — M79661 Pain in right lower leg: Secondary | ICD-10-CM | POA: Diagnosis not present

## 2016-08-13 DIAGNOSIS — N2889 Other specified disorders of kidney and ureter: Secondary | ICD-10-CM | POA: Diagnosis not present

## 2016-08-13 DIAGNOSIS — Z8249 Family history of ischemic heart disease and other diseases of the circulatory system: Secondary | ICD-10-CM | POA: Diagnosis not present

## 2016-08-13 DIAGNOSIS — D72829 Elevated white blood cell count, unspecified: Secondary | ICD-10-CM | POA: Diagnosis not present

## 2016-08-13 DIAGNOSIS — M109 Gout, unspecified: Secondary | ICD-10-CM | POA: Diagnosis not present

## 2016-08-13 DIAGNOSIS — B379 Candidiasis, unspecified: Secondary | ICD-10-CM | POA: Diagnosis not present

## 2016-08-13 DIAGNOSIS — L03116 Cellulitis of left lower limb: Secondary | ICD-10-CM | POA: Diagnosis not present

## 2016-08-13 DIAGNOSIS — M7989 Other specified soft tissue disorders: Secondary | ICD-10-CM | POA: Diagnosis not present

## 2016-08-13 DIAGNOSIS — Z833 Family history of diabetes mellitus: Secondary | ICD-10-CM | POA: Diagnosis not present

## 2016-08-13 DIAGNOSIS — L0390 Cellulitis, unspecified: Secondary | ICD-10-CM | POA: Diagnosis not present

## 2016-08-13 DIAGNOSIS — N183 Chronic kidney disease, stage 3 (moderate): Secondary | ICD-10-CM | POA: Diagnosis not present

## 2016-08-13 DIAGNOSIS — J9601 Acute respiratory failure with hypoxia: Secondary | ICD-10-CM | POA: Diagnosis not present

## 2016-08-13 DIAGNOSIS — M1712 Unilateral primary osteoarthritis, left knee: Secondary | ICD-10-CM | POA: Diagnosis not present

## 2016-08-13 DIAGNOSIS — M25462 Effusion, left knee: Secondary | ICD-10-CM | POA: Diagnosis not present

## 2016-08-13 DIAGNOSIS — R918 Other nonspecific abnormal finding of lung field: Secondary | ICD-10-CM | POA: Diagnosis not present

## 2016-08-13 DIAGNOSIS — I129 Hypertensive chronic kidney disease with stage 1 through stage 4 chronic kidney disease, or unspecified chronic kidney disease: Secondary | ICD-10-CM | POA: Diagnosis not present

## 2016-08-13 DIAGNOSIS — M79605 Pain in left leg: Secondary | ICD-10-CM | POA: Diagnosis not present

## 2016-08-13 DIAGNOSIS — Z7401 Bed confinement status: Secondary | ICD-10-CM | POA: Diagnosis not present

## 2016-08-13 DIAGNOSIS — Z9119 Patient's noncompliance with other medical treatment and regimen: Secondary | ICD-10-CM | POA: Diagnosis not present

## 2016-08-13 DIAGNOSIS — Z7409 Other reduced mobility: Secondary | ICD-10-CM | POA: Diagnosis not present

## 2016-08-13 DIAGNOSIS — E875 Hyperkalemia: Secondary | ICD-10-CM | POA: Diagnosis not present

## 2016-08-13 DIAGNOSIS — L03311 Cellulitis of abdominal wall: Secondary | ICD-10-CM | POA: Diagnosis not present

## 2016-08-13 DIAGNOSIS — Z9989 Dependence on other enabling machines and devices: Secondary | ICD-10-CM | POA: Diagnosis not present

## 2016-08-13 DIAGNOSIS — M79604 Pain in right leg: Secondary | ICD-10-CM | POA: Diagnosis not present

## 2016-08-13 DIAGNOSIS — G4733 Obstructive sleep apnea (adult) (pediatric): Secondary | ICD-10-CM | POA: Diagnosis not present

## 2016-08-13 DIAGNOSIS — J9600 Acute respiratory failure, unspecified whether with hypoxia or hypercapnia: Secondary | ICD-10-CM | POA: Diagnosis not present

## 2016-08-13 DIAGNOSIS — M25559 Pain in unspecified hip: Secondary | ICD-10-CM | POA: Diagnosis not present

## 2016-08-13 DIAGNOSIS — E1122 Type 2 diabetes mellitus with diabetic chronic kidney disease: Secondary | ICD-10-CM | POA: Diagnosis not present

## 2016-08-13 DIAGNOSIS — M7752 Other enthesopathy of left foot: Secondary | ICD-10-CM | POA: Diagnosis not present

## 2016-08-21 DIAGNOSIS — M109 Gout, unspecified: Secondary | ICD-10-CM | POA: Diagnosis not present

## 2016-08-21 DIAGNOSIS — G4733 Obstructive sleep apnea (adult) (pediatric): Secondary | ICD-10-CM | POA: Diagnosis not present

## 2016-08-21 DIAGNOSIS — Z87891 Personal history of nicotine dependence: Secondary | ICD-10-CM | POA: Diagnosis not present

## 2016-08-21 DIAGNOSIS — I129 Hypertensive chronic kidney disease with stage 1 through stage 4 chronic kidney disease, or unspecified chronic kidney disease: Secondary | ICD-10-CM | POA: Diagnosis not present

## 2016-08-21 DIAGNOSIS — M79605 Pain in left leg: Secondary | ICD-10-CM | POA: Diagnosis not present

## 2016-08-21 DIAGNOSIS — B379 Candidiasis, unspecified: Secondary | ICD-10-CM | POA: Diagnosis not present

## 2016-08-21 DIAGNOSIS — M79604 Pain in right leg: Secondary | ICD-10-CM | POA: Diagnosis not present

## 2016-08-21 DIAGNOSIS — N189 Chronic kidney disease, unspecified: Secondary | ICD-10-CM | POA: Diagnosis not present

## 2016-08-23 DIAGNOSIS — B379 Candidiasis, unspecified: Secondary | ICD-10-CM | POA: Diagnosis not present

## 2016-08-23 DIAGNOSIS — M109 Gout, unspecified: Secondary | ICD-10-CM | POA: Diagnosis not present

## 2016-08-23 DIAGNOSIS — G4733 Obstructive sleep apnea (adult) (pediatric): Secondary | ICD-10-CM | POA: Diagnosis not present

## 2016-08-23 DIAGNOSIS — Z87891 Personal history of nicotine dependence: Secondary | ICD-10-CM | POA: Diagnosis not present

## 2016-08-23 DIAGNOSIS — N189 Chronic kidney disease, unspecified: Secondary | ICD-10-CM | POA: Diagnosis not present

## 2016-08-23 DIAGNOSIS — I129 Hypertensive chronic kidney disease with stage 1 through stage 4 chronic kidney disease, or unspecified chronic kidney disease: Secondary | ICD-10-CM | POA: Diagnosis not present

## 2016-08-23 DIAGNOSIS — M79604 Pain in right leg: Secondary | ICD-10-CM | POA: Diagnosis not present

## 2016-08-23 DIAGNOSIS — M79605 Pain in left leg: Secondary | ICD-10-CM | POA: Diagnosis not present

## 2016-08-28 DIAGNOSIS — M79604 Pain in right leg: Secondary | ICD-10-CM | POA: Diagnosis not present

## 2016-08-28 DIAGNOSIS — G4733 Obstructive sleep apnea (adult) (pediatric): Secondary | ICD-10-CM | POA: Diagnosis not present

## 2016-08-28 DIAGNOSIS — B379 Candidiasis, unspecified: Secondary | ICD-10-CM | POA: Diagnosis not present

## 2016-08-28 DIAGNOSIS — M79605 Pain in left leg: Secondary | ICD-10-CM | POA: Diagnosis not present

## 2016-08-28 DIAGNOSIS — Z87891 Personal history of nicotine dependence: Secondary | ICD-10-CM | POA: Diagnosis not present

## 2016-08-28 DIAGNOSIS — I129 Hypertensive chronic kidney disease with stage 1 through stage 4 chronic kidney disease, or unspecified chronic kidney disease: Secondary | ICD-10-CM | POA: Diagnosis not present

## 2016-08-28 DIAGNOSIS — N189 Chronic kidney disease, unspecified: Secondary | ICD-10-CM | POA: Diagnosis not present

## 2016-08-28 DIAGNOSIS — M109 Gout, unspecified: Secondary | ICD-10-CM | POA: Diagnosis not present

## 2016-08-31 DIAGNOSIS — N2889 Other specified disorders of kidney and ureter: Secondary | ICD-10-CM | POA: Diagnosis not present

## 2016-08-31 DIAGNOSIS — M79604 Pain in right leg: Secondary | ICD-10-CM | POA: Diagnosis not present

## 2016-08-31 DIAGNOSIS — N189 Chronic kidney disease, unspecified: Secondary | ICD-10-CM | POA: Diagnosis not present

## 2016-08-31 DIAGNOSIS — B379 Candidiasis, unspecified: Secondary | ICD-10-CM | POA: Diagnosis not present

## 2016-08-31 DIAGNOSIS — Z87891 Personal history of nicotine dependence: Secondary | ICD-10-CM | POA: Diagnosis not present

## 2016-08-31 DIAGNOSIS — G4733 Obstructive sleep apnea (adult) (pediatric): Secondary | ICD-10-CM | POA: Diagnosis not present

## 2016-08-31 DIAGNOSIS — M79605 Pain in left leg: Secondary | ICD-10-CM | POA: Diagnosis not present

## 2016-08-31 DIAGNOSIS — N183 Chronic kidney disease, stage 3 (moderate): Secondary | ICD-10-CM | POA: Diagnosis not present

## 2016-08-31 DIAGNOSIS — M109 Gout, unspecified: Secondary | ICD-10-CM | POA: Diagnosis not present

## 2016-08-31 DIAGNOSIS — I129 Hypertensive chronic kidney disease with stage 1 through stage 4 chronic kidney disease, or unspecified chronic kidney disease: Secondary | ICD-10-CM | POA: Diagnosis not present

## 2016-09-01 DIAGNOSIS — M109 Gout, unspecified: Secondary | ICD-10-CM | POA: Diagnosis not present

## 2016-09-01 DIAGNOSIS — G4733 Obstructive sleep apnea (adult) (pediatric): Secondary | ICD-10-CM | POA: Diagnosis not present

## 2016-09-01 DIAGNOSIS — M79604 Pain in right leg: Secondary | ICD-10-CM | POA: Diagnosis not present

## 2016-09-01 DIAGNOSIS — B379 Candidiasis, unspecified: Secondary | ICD-10-CM | POA: Diagnosis not present

## 2016-09-01 DIAGNOSIS — M79605 Pain in left leg: Secondary | ICD-10-CM | POA: Diagnosis not present

## 2016-09-01 DIAGNOSIS — Z87891 Personal history of nicotine dependence: Secondary | ICD-10-CM | POA: Diagnosis not present

## 2016-09-01 DIAGNOSIS — N189 Chronic kidney disease, unspecified: Secondary | ICD-10-CM | POA: Diagnosis not present

## 2016-09-01 DIAGNOSIS — I129 Hypertensive chronic kidney disease with stage 1 through stage 4 chronic kidney disease, or unspecified chronic kidney disease: Secondary | ICD-10-CM | POA: Diagnosis not present

## 2016-09-04 DIAGNOSIS — M79604 Pain in right leg: Secondary | ICD-10-CM | POA: Diagnosis not present

## 2016-09-04 DIAGNOSIS — Z87891 Personal history of nicotine dependence: Secondary | ICD-10-CM | POA: Diagnosis not present

## 2016-09-04 DIAGNOSIS — I129 Hypertensive chronic kidney disease with stage 1 through stage 4 chronic kidney disease, or unspecified chronic kidney disease: Secondary | ICD-10-CM | POA: Diagnosis not present

## 2016-09-04 DIAGNOSIS — M109 Gout, unspecified: Secondary | ICD-10-CM | POA: Diagnosis not present

## 2016-09-04 DIAGNOSIS — G4733 Obstructive sleep apnea (adult) (pediatric): Secondary | ICD-10-CM | POA: Diagnosis not present

## 2016-09-04 DIAGNOSIS — B379 Candidiasis, unspecified: Secondary | ICD-10-CM | POA: Diagnosis not present

## 2016-09-04 DIAGNOSIS — M79605 Pain in left leg: Secondary | ICD-10-CM | POA: Diagnosis not present

## 2016-09-04 DIAGNOSIS — N189 Chronic kidney disease, unspecified: Secondary | ICD-10-CM | POA: Diagnosis not present

## 2016-09-05 DIAGNOSIS — M109 Gout, unspecified: Secondary | ICD-10-CM | POA: Diagnosis not present

## 2016-09-05 DIAGNOSIS — G4733 Obstructive sleep apnea (adult) (pediatric): Secondary | ICD-10-CM | POA: Diagnosis not present

## 2016-09-05 DIAGNOSIS — M79604 Pain in right leg: Secondary | ICD-10-CM | POA: Diagnosis not present

## 2016-09-05 DIAGNOSIS — M79605 Pain in left leg: Secondary | ICD-10-CM | POA: Diagnosis not present

## 2016-09-05 DIAGNOSIS — B379 Candidiasis, unspecified: Secondary | ICD-10-CM | POA: Diagnosis not present

## 2016-09-05 DIAGNOSIS — N189 Chronic kidney disease, unspecified: Secondary | ICD-10-CM | POA: Diagnosis not present

## 2016-09-05 DIAGNOSIS — Z87891 Personal history of nicotine dependence: Secondary | ICD-10-CM | POA: Diagnosis not present

## 2016-09-05 DIAGNOSIS — I129 Hypertensive chronic kidney disease with stage 1 through stage 4 chronic kidney disease, or unspecified chronic kidney disease: Secondary | ICD-10-CM | POA: Diagnosis not present

## 2016-09-07 DIAGNOSIS — M79604 Pain in right leg: Secondary | ICD-10-CM | POA: Diagnosis not present

## 2016-09-07 DIAGNOSIS — Z87891 Personal history of nicotine dependence: Secondary | ICD-10-CM | POA: Diagnosis not present

## 2016-09-07 DIAGNOSIS — G4733 Obstructive sleep apnea (adult) (pediatric): Secondary | ICD-10-CM | POA: Diagnosis not present

## 2016-09-07 DIAGNOSIS — N189 Chronic kidney disease, unspecified: Secondary | ICD-10-CM | POA: Diagnosis not present

## 2016-09-07 DIAGNOSIS — B379 Candidiasis, unspecified: Secondary | ICD-10-CM | POA: Diagnosis not present

## 2016-09-07 DIAGNOSIS — M79605 Pain in left leg: Secondary | ICD-10-CM | POA: Diagnosis not present

## 2016-09-07 DIAGNOSIS — I129 Hypertensive chronic kidney disease with stage 1 through stage 4 chronic kidney disease, or unspecified chronic kidney disease: Secondary | ICD-10-CM | POA: Diagnosis not present

## 2016-09-07 DIAGNOSIS — M109 Gout, unspecified: Secondary | ICD-10-CM | POA: Diagnosis not present

## 2016-09-11 DIAGNOSIS — M109 Gout, unspecified: Secondary | ICD-10-CM | POA: Diagnosis not present

## 2016-09-11 DIAGNOSIS — Z87891 Personal history of nicotine dependence: Secondary | ICD-10-CM | POA: Diagnosis not present

## 2016-09-11 DIAGNOSIS — N189 Chronic kidney disease, unspecified: Secondary | ICD-10-CM | POA: Diagnosis not present

## 2016-09-11 DIAGNOSIS — M79604 Pain in right leg: Secondary | ICD-10-CM | POA: Diagnosis not present

## 2016-09-11 DIAGNOSIS — I129 Hypertensive chronic kidney disease with stage 1 through stage 4 chronic kidney disease, or unspecified chronic kidney disease: Secondary | ICD-10-CM | POA: Diagnosis not present

## 2016-09-11 DIAGNOSIS — M79605 Pain in left leg: Secondary | ICD-10-CM | POA: Diagnosis not present

## 2016-09-11 DIAGNOSIS — G4733 Obstructive sleep apnea (adult) (pediatric): Secondary | ICD-10-CM | POA: Diagnosis not present

## 2016-09-11 DIAGNOSIS — B379 Candidiasis, unspecified: Secondary | ICD-10-CM | POA: Diagnosis not present

## 2016-09-13 DIAGNOSIS — B379 Candidiasis, unspecified: Secondary | ICD-10-CM | POA: Diagnosis not present

## 2016-09-13 DIAGNOSIS — M79605 Pain in left leg: Secondary | ICD-10-CM | POA: Diagnosis not present

## 2016-09-13 DIAGNOSIS — I129 Hypertensive chronic kidney disease with stage 1 through stage 4 chronic kidney disease, or unspecified chronic kidney disease: Secondary | ICD-10-CM | POA: Diagnosis not present

## 2016-09-13 DIAGNOSIS — M109 Gout, unspecified: Secondary | ICD-10-CM | POA: Diagnosis not present

## 2016-09-13 DIAGNOSIS — N189 Chronic kidney disease, unspecified: Secondary | ICD-10-CM | POA: Diagnosis not present

## 2016-09-13 DIAGNOSIS — Z87891 Personal history of nicotine dependence: Secondary | ICD-10-CM | POA: Diagnosis not present

## 2016-09-13 DIAGNOSIS — G4733 Obstructive sleep apnea (adult) (pediatric): Secondary | ICD-10-CM | POA: Diagnosis not present

## 2016-09-13 DIAGNOSIS — M79604 Pain in right leg: Secondary | ICD-10-CM | POA: Diagnosis not present

## 2016-09-15 DIAGNOSIS — M79605 Pain in left leg: Secondary | ICD-10-CM | POA: Diagnosis not present

## 2016-09-15 DIAGNOSIS — M79604 Pain in right leg: Secondary | ICD-10-CM | POA: Diagnosis not present

## 2016-09-15 DIAGNOSIS — Z87891 Personal history of nicotine dependence: Secondary | ICD-10-CM | POA: Diagnosis not present

## 2016-09-15 DIAGNOSIS — B379 Candidiasis, unspecified: Secondary | ICD-10-CM | POA: Diagnosis not present

## 2016-09-15 DIAGNOSIS — G4733 Obstructive sleep apnea (adult) (pediatric): Secondary | ICD-10-CM | POA: Diagnosis not present

## 2016-09-15 DIAGNOSIS — N189 Chronic kidney disease, unspecified: Secondary | ICD-10-CM | POA: Diagnosis not present

## 2016-09-15 DIAGNOSIS — M109 Gout, unspecified: Secondary | ICD-10-CM | POA: Diagnosis not present

## 2016-09-15 DIAGNOSIS — I129 Hypertensive chronic kidney disease with stage 1 through stage 4 chronic kidney disease, or unspecified chronic kidney disease: Secondary | ICD-10-CM | POA: Diagnosis not present

## 2016-09-18 DIAGNOSIS — M79604 Pain in right leg: Secondary | ICD-10-CM | POA: Diagnosis not present

## 2016-09-18 DIAGNOSIS — I129 Hypertensive chronic kidney disease with stage 1 through stage 4 chronic kidney disease, or unspecified chronic kidney disease: Secondary | ICD-10-CM | POA: Diagnosis not present

## 2016-09-18 DIAGNOSIS — M79605 Pain in left leg: Secondary | ICD-10-CM | POA: Diagnosis not present

## 2016-09-18 DIAGNOSIS — G4733 Obstructive sleep apnea (adult) (pediatric): Secondary | ICD-10-CM | POA: Diagnosis not present

## 2016-09-18 DIAGNOSIS — B379 Candidiasis, unspecified: Secondary | ICD-10-CM | POA: Diagnosis not present

## 2016-09-18 DIAGNOSIS — N189 Chronic kidney disease, unspecified: Secondary | ICD-10-CM | POA: Diagnosis not present

## 2016-09-18 DIAGNOSIS — M109 Gout, unspecified: Secondary | ICD-10-CM | POA: Diagnosis not present

## 2016-09-18 DIAGNOSIS — Z87891 Personal history of nicotine dependence: Secondary | ICD-10-CM | POA: Diagnosis not present

## 2016-09-19 DIAGNOSIS — I10 Essential (primary) hypertension: Secondary | ICD-10-CM | POA: Diagnosis not present

## 2016-09-19 DIAGNOSIS — N2889 Other specified disorders of kidney and ureter: Secondary | ICD-10-CM | POA: Diagnosis not present

## 2016-09-19 DIAGNOSIS — R590 Localized enlarged lymph nodes: Secondary | ICD-10-CM | POA: Diagnosis not present

## 2016-09-19 DIAGNOSIS — G4733 Obstructive sleep apnea (adult) (pediatric): Secondary | ICD-10-CM | POA: Diagnosis not present

## 2016-09-19 DIAGNOSIS — D509 Iron deficiency anemia, unspecified: Secondary | ICD-10-CM | POA: Diagnosis not present

## 2016-09-19 DIAGNOSIS — Z419 Encounter for procedure for purposes other than remedying health state, unspecified: Secondary | ICD-10-CM | POA: Diagnosis not present

## 2016-09-19 DIAGNOSIS — M109 Gout, unspecified: Secondary | ICD-10-CM | POA: Diagnosis not present

## 2016-09-19 DIAGNOSIS — Z7982 Long term (current) use of aspirin: Secondary | ICD-10-CM | POA: Diagnosis not present

## 2016-09-19 DIAGNOSIS — I129 Hypertensive chronic kidney disease with stage 1 through stage 4 chronic kidney disease, or unspecified chronic kidney disease: Secondary | ICD-10-CM | POA: Diagnosis not present

## 2016-09-19 DIAGNOSIS — C642 Malignant neoplasm of left kidney, except renal pelvis: Secondary | ICD-10-CM | POA: Diagnosis not present

## 2016-09-20 DIAGNOSIS — Z01818 Encounter for other preprocedural examination: Secondary | ICD-10-CM | POA: Diagnosis not present

## 2016-09-20 DIAGNOSIS — R9431 Abnormal electrocardiogram [ECG] [EKG]: Secondary | ICD-10-CM | POA: Diagnosis not present

## 2016-09-20 DIAGNOSIS — Z01810 Encounter for preprocedural cardiovascular examination: Secondary | ICD-10-CM | POA: Diagnosis not present

## 2016-09-20 DIAGNOSIS — I4519 Other right bundle-branch block: Secondary | ICD-10-CM | POA: Diagnosis not present

## 2016-09-21 DIAGNOSIS — M109 Gout, unspecified: Secondary | ICD-10-CM | POA: Diagnosis not present

## 2016-09-21 DIAGNOSIS — Z87891 Personal history of nicotine dependence: Secondary | ICD-10-CM | POA: Diagnosis not present

## 2016-09-21 DIAGNOSIS — G4733 Obstructive sleep apnea (adult) (pediatric): Secondary | ICD-10-CM | POA: Diagnosis not present

## 2016-09-21 DIAGNOSIS — M79604 Pain in right leg: Secondary | ICD-10-CM | POA: Diagnosis not present

## 2016-09-21 DIAGNOSIS — N189 Chronic kidney disease, unspecified: Secondary | ICD-10-CM | POA: Diagnosis not present

## 2016-09-21 DIAGNOSIS — M79605 Pain in left leg: Secondary | ICD-10-CM | POA: Diagnosis not present

## 2016-09-21 DIAGNOSIS — B379 Candidiasis, unspecified: Secondary | ICD-10-CM | POA: Diagnosis not present

## 2016-09-21 DIAGNOSIS — I129 Hypertensive chronic kidney disease with stage 1 through stage 4 chronic kidney disease, or unspecified chronic kidney disease: Secondary | ICD-10-CM | POA: Diagnosis not present

## 2016-09-22 DIAGNOSIS — I4510 Unspecified right bundle-branch block: Secondary | ICD-10-CM | POA: Diagnosis not present

## 2016-09-25 DIAGNOSIS — C642 Malignant neoplasm of left kidney, except renal pelvis: Secondary | ICD-10-CM | POA: Diagnosis not present

## 2016-09-25 DIAGNOSIS — G4733 Obstructive sleep apnea (adult) (pediatric): Secondary | ICD-10-CM | POA: Diagnosis not present

## 2016-09-25 DIAGNOSIS — N2889 Other specified disorders of kidney and ureter: Secondary | ICD-10-CM | POA: Diagnosis not present

## 2016-09-25 DIAGNOSIS — I10 Essential (primary) hypertension: Secondary | ICD-10-CM | POA: Diagnosis not present

## 2016-09-25 DIAGNOSIS — Z7982 Long term (current) use of aspirin: Secondary | ICD-10-CM | POA: Diagnosis not present

## 2016-09-25 DIAGNOSIS — M109 Gout, unspecified: Secondary | ICD-10-CM | POA: Diagnosis not present

## 2016-09-29 DIAGNOSIS — M109 Gout, unspecified: Secondary | ICD-10-CM | POA: Diagnosis not present

## 2016-09-29 DIAGNOSIS — M79605 Pain in left leg: Secondary | ICD-10-CM | POA: Diagnosis not present

## 2016-09-29 DIAGNOSIS — Z87891 Personal history of nicotine dependence: Secondary | ICD-10-CM | POA: Diagnosis not present

## 2016-09-29 DIAGNOSIS — M79604 Pain in right leg: Secondary | ICD-10-CM | POA: Diagnosis not present

## 2016-09-29 DIAGNOSIS — I129 Hypertensive chronic kidney disease with stage 1 through stage 4 chronic kidney disease, or unspecified chronic kidney disease: Secondary | ICD-10-CM | POA: Diagnosis not present

## 2016-09-29 DIAGNOSIS — B379 Candidiasis, unspecified: Secondary | ICD-10-CM | POA: Diagnosis not present

## 2016-09-29 DIAGNOSIS — N189 Chronic kidney disease, unspecified: Secondary | ICD-10-CM | POA: Diagnosis not present

## 2016-09-29 DIAGNOSIS — G4733 Obstructive sleep apnea (adult) (pediatric): Secondary | ICD-10-CM | POA: Diagnosis not present

## 2016-10-02 DIAGNOSIS — M79605 Pain in left leg: Secondary | ICD-10-CM | POA: Diagnosis not present

## 2016-10-02 DIAGNOSIS — M109 Gout, unspecified: Secondary | ICD-10-CM | POA: Diagnosis not present

## 2016-10-02 DIAGNOSIS — M79604 Pain in right leg: Secondary | ICD-10-CM | POA: Diagnosis not present

## 2016-10-02 DIAGNOSIS — B379 Candidiasis, unspecified: Secondary | ICD-10-CM | POA: Diagnosis not present

## 2016-10-02 DIAGNOSIS — Z87891 Personal history of nicotine dependence: Secondary | ICD-10-CM | POA: Diagnosis not present

## 2016-10-02 DIAGNOSIS — N189 Chronic kidney disease, unspecified: Secondary | ICD-10-CM | POA: Diagnosis not present

## 2016-10-02 DIAGNOSIS — G4733 Obstructive sleep apnea (adult) (pediatric): Secondary | ICD-10-CM | POA: Diagnosis not present

## 2016-10-02 DIAGNOSIS — I129 Hypertensive chronic kidney disease with stage 1 through stage 4 chronic kidney disease, or unspecified chronic kidney disease: Secondary | ICD-10-CM | POA: Diagnosis not present

## 2016-10-04 DIAGNOSIS — N189 Chronic kidney disease, unspecified: Secondary | ICD-10-CM | POA: Diagnosis not present

## 2016-10-04 DIAGNOSIS — Z87891 Personal history of nicotine dependence: Secondary | ICD-10-CM | POA: Diagnosis not present

## 2016-10-04 DIAGNOSIS — M79604 Pain in right leg: Secondary | ICD-10-CM | POA: Diagnosis not present

## 2016-10-04 DIAGNOSIS — M109 Gout, unspecified: Secondary | ICD-10-CM | POA: Diagnosis not present

## 2016-10-04 DIAGNOSIS — M79605 Pain in left leg: Secondary | ICD-10-CM | POA: Diagnosis not present

## 2016-10-04 DIAGNOSIS — I129 Hypertensive chronic kidney disease with stage 1 through stage 4 chronic kidney disease, or unspecified chronic kidney disease: Secondary | ICD-10-CM | POA: Diagnosis not present

## 2016-10-04 DIAGNOSIS — B379 Candidiasis, unspecified: Secondary | ICD-10-CM | POA: Diagnosis not present

## 2016-10-04 DIAGNOSIS — G4733 Obstructive sleep apnea (adult) (pediatric): Secondary | ICD-10-CM | POA: Diagnosis not present

## 2016-10-05 DIAGNOSIS — N183 Chronic kidney disease, stage 3 (moderate): Secondary | ICD-10-CM | POA: Diagnosis not present

## 2016-10-05 DIAGNOSIS — M25512 Pain in left shoulder: Secondary | ICD-10-CM | POA: Diagnosis not present

## 2016-10-05 DIAGNOSIS — J9610 Chronic respiratory failure, unspecified whether with hypoxia or hypercapnia: Secondary | ICD-10-CM | POA: Diagnosis not present

## 2016-10-05 DIAGNOSIS — Z9119 Patient's noncompliance with other medical treatment and regimen: Secondary | ICD-10-CM | POA: Diagnosis not present

## 2016-10-05 DIAGNOSIS — I129 Hypertensive chronic kidney disease with stage 1 through stage 4 chronic kidney disease, or unspecified chronic kidney disease: Secondary | ICD-10-CM | POA: Diagnosis not present

## 2016-10-05 DIAGNOSIS — W19XXXA Unspecified fall, initial encounter: Secondary | ICD-10-CM | POA: Diagnosis not present

## 2016-10-05 DIAGNOSIS — E860 Dehydration: Secondary | ICD-10-CM | POA: Diagnosis not present

## 2016-10-05 DIAGNOSIS — E875 Hyperkalemia: Secondary | ICD-10-CM | POA: Diagnosis not present

## 2016-10-05 DIAGNOSIS — D72829 Elevated white blood cell count, unspecified: Secondary | ICD-10-CM | POA: Diagnosis not present

## 2016-10-05 DIAGNOSIS — Z7401 Bed confinement status: Secondary | ICD-10-CM | POA: Diagnosis not present

## 2016-10-05 DIAGNOSIS — Z9981 Dependence on supplemental oxygen: Secondary | ICD-10-CM | POA: Diagnosis not present

## 2016-10-05 DIAGNOSIS — M25532 Pain in left wrist: Secondary | ICD-10-CM | POA: Diagnosis not present

## 2016-10-05 DIAGNOSIS — Z7982 Long term (current) use of aspirin: Secondary | ICD-10-CM | POA: Diagnosis not present

## 2016-10-05 DIAGNOSIS — M222X2 Patellofemoral disorders, left knee: Secondary | ICD-10-CM | POA: Diagnosis not present

## 2016-10-05 DIAGNOSIS — N179 Acute kidney failure, unspecified: Secondary | ICD-10-CM | POA: Diagnosis not present

## 2016-10-05 DIAGNOSIS — M069 Rheumatoid arthritis, unspecified: Secondary | ICD-10-CM | POA: Diagnosis not present

## 2016-10-05 DIAGNOSIS — G8929 Other chronic pain: Secondary | ICD-10-CM | POA: Diagnosis not present

## 2016-10-05 DIAGNOSIS — M25562 Pain in left knee: Secondary | ICD-10-CM | POA: Diagnosis not present

## 2016-10-05 DIAGNOSIS — M7989 Other specified soft tissue disorders: Secondary | ICD-10-CM | POA: Diagnosis not present

## 2016-10-05 DIAGNOSIS — M1712 Unilateral primary osteoarthritis, left knee: Secondary | ICD-10-CM | POA: Diagnosis not present

## 2016-10-05 DIAGNOSIS — E871 Hypo-osmolality and hyponatremia: Secondary | ICD-10-CM | POA: Diagnosis not present

## 2016-10-05 DIAGNOSIS — R531 Weakness: Secondary | ICD-10-CM | POA: Diagnosis not present

## 2016-10-05 DIAGNOSIS — Z87891 Personal history of nicotine dependence: Secondary | ICD-10-CM | POA: Diagnosis not present

## 2016-10-05 DIAGNOSIS — M25462 Effusion, left knee: Secondary | ICD-10-CM | POA: Diagnosis not present

## 2016-10-05 DIAGNOSIS — M109 Gout, unspecified: Secondary | ICD-10-CM | POA: Diagnosis not present

## 2016-10-05 DIAGNOSIS — M79605 Pain in left leg: Secondary | ICD-10-CM | POA: Diagnosis not present

## 2016-10-05 DIAGNOSIS — C7492 Malignant neoplasm of unspecified part of left adrenal gland: Secondary | ICD-10-CM | POA: Diagnosis not present

## 2016-10-05 DIAGNOSIS — Z905 Acquired absence of kidney: Secondary | ICD-10-CM | POA: Diagnosis not present

## 2016-10-05 DIAGNOSIS — M79602 Pain in left arm: Secondary | ICD-10-CM | POA: Diagnosis not present

## 2016-10-06 DIAGNOSIS — I998 Other disorder of circulatory system: Secondary | ICD-10-CM | POA: Diagnosis not present

## 2016-10-06 DIAGNOSIS — M76892 Other specified enthesopathies of left lower limb, excluding foot: Secondary | ICD-10-CM | POA: Diagnosis not present

## 2016-10-06 DIAGNOSIS — I4510 Unspecified right bundle-branch block: Secondary | ICD-10-CM | POA: Diagnosis not present

## 2016-10-06 DIAGNOSIS — M25462 Effusion, left knee: Secondary | ICD-10-CM | POA: Diagnosis not present

## 2016-10-06 DIAGNOSIS — Z043 Encounter for examination and observation following other accident: Secondary | ICD-10-CM | POA: Diagnosis not present

## 2016-10-06 DIAGNOSIS — M1712 Unilateral primary osteoarthritis, left knee: Secondary | ICD-10-CM | POA: Diagnosis not present

## 2016-10-07 DIAGNOSIS — Z7401 Bed confinement status: Secondary | ICD-10-CM | POA: Diagnosis not present

## 2016-10-07 DIAGNOSIS — C7492 Malignant neoplasm of unspecified part of left adrenal gland: Secondary | ICD-10-CM | POA: Diagnosis not present

## 2016-10-07 DIAGNOSIS — Z7982 Long term (current) use of aspirin: Secondary | ICD-10-CM | POA: Diagnosis not present

## 2016-10-07 DIAGNOSIS — N179 Acute kidney failure, unspecified: Secondary | ICD-10-CM | POA: Diagnosis not present

## 2016-10-07 DIAGNOSIS — I129 Hypertensive chronic kidney disease with stage 1 through stage 4 chronic kidney disease, or unspecified chronic kidney disease: Secondary | ICD-10-CM | POA: Diagnosis not present

## 2016-10-07 DIAGNOSIS — G8929 Other chronic pain: Secondary | ICD-10-CM | POA: Diagnosis not present

## 2016-10-07 DIAGNOSIS — E860 Dehydration: Secondary | ICD-10-CM | POA: Diagnosis not present

## 2016-10-07 DIAGNOSIS — Z905 Acquired absence of kidney: Secondary | ICD-10-CM | POA: Diagnosis not present

## 2016-10-07 DIAGNOSIS — E871 Hypo-osmolality and hyponatremia: Secondary | ICD-10-CM | POA: Diagnosis not present

## 2016-10-07 DIAGNOSIS — M1712 Unilateral primary osteoarthritis, left knee: Secondary | ICD-10-CM | POA: Diagnosis not present

## 2016-10-07 DIAGNOSIS — W19XXXA Unspecified fall, initial encounter: Secondary | ICD-10-CM | POA: Diagnosis not present

## 2016-10-07 DIAGNOSIS — M069 Rheumatoid arthritis, unspecified: Secondary | ICD-10-CM | POA: Diagnosis not present

## 2016-10-07 DIAGNOSIS — N183 Chronic kidney disease, stage 3 (moderate): Secondary | ICD-10-CM | POA: Diagnosis not present

## 2016-10-07 DIAGNOSIS — R531 Weakness: Secondary | ICD-10-CM | POA: Diagnosis not present

## 2016-10-07 DIAGNOSIS — D72829 Elevated white blood cell count, unspecified: Secondary | ICD-10-CM | POA: Diagnosis not present

## 2016-10-07 DIAGNOSIS — E875 Hyperkalemia: Secondary | ICD-10-CM | POA: Diagnosis not present

## 2016-10-07 DIAGNOSIS — J9610 Chronic respiratory failure, unspecified whether with hypoxia or hypercapnia: Secondary | ICD-10-CM | POA: Diagnosis not present

## 2016-10-07 DIAGNOSIS — Z87891 Personal history of nicotine dependence: Secondary | ICD-10-CM | POA: Diagnosis not present

## 2016-10-07 DIAGNOSIS — M25462 Effusion, left knee: Secondary | ICD-10-CM | POA: Diagnosis not present

## 2016-10-07 DIAGNOSIS — Z9981 Dependence on supplemental oxygen: Secondary | ICD-10-CM | POA: Diagnosis not present

## 2016-10-07 DIAGNOSIS — M109 Gout, unspecified: Secondary | ICD-10-CM | POA: Diagnosis not present

## 2016-10-07 DIAGNOSIS — M25562 Pain in left knee: Secondary | ICD-10-CM | POA: Diagnosis not present

## 2016-10-07 DIAGNOSIS — M6281 Muscle weakness (generalized): Secondary | ICD-10-CM | POA: Diagnosis not present

## 2016-10-07 DIAGNOSIS — Z9119 Patient's noncompliance with other medical treatment and regimen: Secondary | ICD-10-CM | POA: Diagnosis not present

## 2016-10-08 DIAGNOSIS — I4510 Unspecified right bundle-branch block: Secondary | ICD-10-CM | POA: Diagnosis not present

## 2016-10-08 DIAGNOSIS — J9610 Chronic respiratory failure, unspecified whether with hypoxia or hypercapnia: Secondary | ICD-10-CM | POA: Diagnosis not present

## 2016-10-08 DIAGNOSIS — C7492 Malignant neoplasm of unspecified part of left adrenal gland: Secondary | ICD-10-CM | POA: Diagnosis not present

## 2016-10-08 DIAGNOSIS — Z87891 Personal history of nicotine dependence: Secondary | ICD-10-CM | POA: Diagnosis not present

## 2016-10-08 DIAGNOSIS — M069 Rheumatoid arthritis, unspecified: Secondary | ICD-10-CM | POA: Diagnosis not present

## 2016-10-08 DIAGNOSIS — M25462 Effusion, left knee: Secondary | ICD-10-CM | POA: Diagnosis not present

## 2016-10-08 DIAGNOSIS — M109 Gout, unspecified: Secondary | ICD-10-CM | POA: Diagnosis not present

## 2016-10-08 DIAGNOSIS — Z9119 Patient's noncompliance with other medical treatment and regimen: Secondary | ICD-10-CM | POA: Diagnosis not present

## 2016-10-08 DIAGNOSIS — I129 Hypertensive chronic kidney disease with stage 1 through stage 4 chronic kidney disease, or unspecified chronic kidney disease: Secondary | ICD-10-CM | POA: Diagnosis not present

## 2016-10-08 DIAGNOSIS — Z9981 Dependence on supplemental oxygen: Secondary | ICD-10-CM | POA: Diagnosis not present

## 2016-10-08 DIAGNOSIS — N179 Acute kidney failure, unspecified: Secondary | ICD-10-CM | POA: Diagnosis not present

## 2016-10-08 DIAGNOSIS — Z7409 Other reduced mobility: Secondary | ICD-10-CM | POA: Diagnosis not present

## 2016-10-08 DIAGNOSIS — D72829 Elevated white blood cell count, unspecified: Secondary | ICD-10-CM | POA: Diagnosis not present

## 2016-10-08 DIAGNOSIS — G8929 Other chronic pain: Secondary | ICD-10-CM | POA: Diagnosis not present

## 2016-10-08 DIAGNOSIS — R531 Weakness: Secondary | ICD-10-CM | POA: Diagnosis not present

## 2016-10-08 DIAGNOSIS — N183 Chronic kidney disease, stage 3 (moderate): Secondary | ICD-10-CM | POA: Diagnosis not present

## 2016-10-08 DIAGNOSIS — Z7982 Long term (current) use of aspirin: Secondary | ICD-10-CM | POA: Diagnosis not present

## 2016-10-08 DIAGNOSIS — M6281 Muscle weakness (generalized): Secondary | ICD-10-CM | POA: Diagnosis not present

## 2016-10-08 DIAGNOSIS — E871 Hypo-osmolality and hyponatremia: Secondary | ICD-10-CM | POA: Diagnosis not present

## 2016-10-08 DIAGNOSIS — E875 Hyperkalemia: Secondary | ICD-10-CM | POA: Diagnosis not present

## 2016-10-08 DIAGNOSIS — M1712 Unilateral primary osteoarthritis, left knee: Secondary | ICD-10-CM | POA: Diagnosis not present

## 2016-10-08 DIAGNOSIS — M170 Bilateral primary osteoarthritis of knee: Secondary | ICD-10-CM | POA: Diagnosis not present

## 2016-10-08 DIAGNOSIS — W19XXXA Unspecified fall, initial encounter: Secondary | ICD-10-CM | POA: Diagnosis not present

## 2016-10-08 DIAGNOSIS — E860 Dehydration: Secondary | ICD-10-CM | POA: Diagnosis not present

## 2016-10-08 DIAGNOSIS — Z905 Acquired absence of kidney: Secondary | ICD-10-CM | POA: Diagnosis not present

## 2016-10-08 DIAGNOSIS — Z7401 Bed confinement status: Secondary | ICD-10-CM | POA: Diagnosis not present

## 2016-10-09 DIAGNOSIS — W19XXXA Unspecified fall, initial encounter: Secondary | ICD-10-CM | POA: Diagnosis not present

## 2016-10-09 DIAGNOSIS — M109 Gout, unspecified: Secondary | ICD-10-CM | POA: Diagnosis not present

## 2016-10-09 DIAGNOSIS — N179 Acute kidney failure, unspecified: Secondary | ICD-10-CM | POA: Diagnosis not present

## 2016-10-09 DIAGNOSIS — D72829 Elevated white blood cell count, unspecified: Secondary | ICD-10-CM | POA: Diagnosis not present

## 2016-10-09 DIAGNOSIS — C7492 Malignant neoplasm of unspecified part of left adrenal gland: Secondary | ICD-10-CM | POA: Diagnosis not present

## 2016-10-09 DIAGNOSIS — Z7409 Other reduced mobility: Secondary | ICD-10-CM | POA: Diagnosis not present

## 2016-10-09 DIAGNOSIS — Z905 Acquired absence of kidney: Secondary | ICD-10-CM | POA: Diagnosis not present

## 2016-10-09 DIAGNOSIS — Z9119 Patient's noncompliance with other medical treatment and regimen: Secondary | ICD-10-CM | POA: Diagnosis not present

## 2016-10-09 DIAGNOSIS — G8929 Other chronic pain: Secondary | ICD-10-CM | POA: Diagnosis not present

## 2016-10-09 DIAGNOSIS — E875 Hyperkalemia: Secondary | ICD-10-CM | POA: Diagnosis not present

## 2016-10-09 DIAGNOSIS — Z7401 Bed confinement status: Secondary | ICD-10-CM | POA: Diagnosis not present

## 2016-10-09 DIAGNOSIS — M170 Bilateral primary osteoarthritis of knee: Secondary | ICD-10-CM | POA: Diagnosis not present

## 2016-10-09 DIAGNOSIS — M069 Rheumatoid arthritis, unspecified: Secondary | ICD-10-CM | POA: Diagnosis not present

## 2016-10-09 DIAGNOSIS — Z9981 Dependence on supplemental oxygen: Secondary | ICD-10-CM | POA: Diagnosis not present

## 2016-10-09 DIAGNOSIS — E860 Dehydration: Secondary | ICD-10-CM | POA: Diagnosis not present

## 2016-10-09 DIAGNOSIS — M25462 Effusion, left knee: Secondary | ICD-10-CM | POA: Diagnosis not present

## 2016-10-09 DIAGNOSIS — R531 Weakness: Secondary | ICD-10-CM | POA: Diagnosis not present

## 2016-10-09 DIAGNOSIS — E871 Hypo-osmolality and hyponatremia: Secondary | ICD-10-CM | POA: Diagnosis not present

## 2016-10-09 DIAGNOSIS — Z87891 Personal history of nicotine dependence: Secondary | ICD-10-CM | POA: Diagnosis not present

## 2016-10-09 DIAGNOSIS — Z7982 Long term (current) use of aspirin: Secondary | ICD-10-CM | POA: Diagnosis not present

## 2016-10-09 DIAGNOSIS — N183 Chronic kidney disease, stage 3 (moderate): Secondary | ICD-10-CM | POA: Diagnosis not present

## 2016-10-09 DIAGNOSIS — M6281 Muscle weakness (generalized): Secondary | ICD-10-CM | POA: Diagnosis not present

## 2016-10-09 DIAGNOSIS — J9610 Chronic respiratory failure, unspecified whether with hypoxia or hypercapnia: Secondary | ICD-10-CM | POA: Diagnosis not present

## 2016-10-09 DIAGNOSIS — M1712 Unilateral primary osteoarthritis, left knee: Secondary | ICD-10-CM | POA: Diagnosis not present

## 2016-10-09 DIAGNOSIS — I129 Hypertensive chronic kidney disease with stage 1 through stage 4 chronic kidney disease, or unspecified chronic kidney disease: Secondary | ICD-10-CM | POA: Diagnosis not present

## 2016-10-10 DIAGNOSIS — Z7982 Long term (current) use of aspirin: Secondary | ICD-10-CM | POA: Diagnosis not present

## 2016-10-10 DIAGNOSIS — J9610 Chronic respiratory failure, unspecified whether with hypoxia or hypercapnia: Secondary | ICD-10-CM | POA: Diagnosis not present

## 2016-10-10 DIAGNOSIS — G8929 Other chronic pain: Secondary | ICD-10-CM | POA: Diagnosis not present

## 2016-10-10 DIAGNOSIS — Z7401 Bed confinement status: Secondary | ICD-10-CM | POA: Diagnosis not present

## 2016-10-10 DIAGNOSIS — I129 Hypertensive chronic kidney disease with stage 1 through stage 4 chronic kidney disease, or unspecified chronic kidney disease: Secondary | ICD-10-CM | POA: Diagnosis not present

## 2016-10-10 DIAGNOSIS — Z483 Aftercare following surgery for neoplasm: Secondary | ICD-10-CM | POA: Diagnosis not present

## 2016-10-10 DIAGNOSIS — Z7409 Other reduced mobility: Secondary | ICD-10-CM | POA: Diagnosis not present

## 2016-10-10 DIAGNOSIS — G4733 Obstructive sleep apnea (adult) (pediatric): Secondary | ICD-10-CM | POA: Diagnosis not present

## 2016-10-10 DIAGNOSIS — Z9119 Patient's noncompliance with other medical treatment and regimen: Secondary | ICD-10-CM | POA: Diagnosis not present

## 2016-10-10 DIAGNOSIS — C649 Malignant neoplasm of unspecified kidney, except renal pelvis: Secondary | ICD-10-CM | POA: Diagnosis not present

## 2016-10-10 DIAGNOSIS — M1990 Unspecified osteoarthritis, unspecified site: Secondary | ICD-10-CM | POA: Diagnosis not present

## 2016-10-10 DIAGNOSIS — C7492 Malignant neoplasm of unspecified part of left adrenal gland: Secondary | ICD-10-CM | POA: Diagnosis not present

## 2016-10-10 DIAGNOSIS — E875 Hyperkalemia: Secondary | ICD-10-CM | POA: Diagnosis not present

## 2016-10-10 DIAGNOSIS — N179 Acute kidney failure, unspecified: Secondary | ICD-10-CM | POA: Diagnosis not present

## 2016-10-10 DIAGNOSIS — N183 Chronic kidney disease, stage 3 (moderate): Secondary | ICD-10-CM | POA: Diagnosis not present

## 2016-10-10 DIAGNOSIS — R52 Pain, unspecified: Secondary | ICD-10-CM | POA: Diagnosis not present

## 2016-10-10 DIAGNOSIS — I10 Essential (primary) hypertension: Secondary | ICD-10-CM | POA: Diagnosis not present

## 2016-10-10 DIAGNOSIS — Z87891 Personal history of nicotine dependence: Secondary | ICD-10-CM | POA: Diagnosis not present

## 2016-10-10 DIAGNOSIS — Z905 Acquired absence of kidney: Secondary | ICD-10-CM | POA: Diagnosis not present

## 2016-10-10 DIAGNOSIS — M6281 Muscle weakness (generalized): Secondary | ICD-10-CM | POA: Diagnosis not present

## 2016-10-10 DIAGNOSIS — D72829 Elevated white blood cell count, unspecified: Secondary | ICD-10-CM | POA: Diagnosis not present

## 2016-10-10 DIAGNOSIS — E860 Dehydration: Secondary | ICD-10-CM | POA: Diagnosis not present

## 2016-10-10 DIAGNOSIS — E871 Hypo-osmolality and hyponatremia: Secondary | ICD-10-CM | POA: Diagnosis not present

## 2016-10-10 DIAGNOSIS — Z48816 Encounter for surgical aftercare following surgery on the genitourinary system: Secondary | ICD-10-CM | POA: Diagnosis not present

## 2016-10-10 DIAGNOSIS — Y92009 Unspecified place in unspecified non-institutional (private) residence as the place of occurrence of the external cause: Secondary | ICD-10-CM | POA: Diagnosis not present

## 2016-10-10 DIAGNOSIS — M069 Rheumatoid arthritis, unspecified: Secondary | ICD-10-CM | POA: Diagnosis not present

## 2016-10-10 DIAGNOSIS — Z9181 History of falling: Secondary | ICD-10-CM | POA: Diagnosis not present

## 2016-10-10 DIAGNOSIS — R262 Difficulty in walking, not elsewhere classified: Secondary | ICD-10-CM | POA: Diagnosis not present

## 2016-10-10 DIAGNOSIS — C642 Malignant neoplasm of left kidney, except renal pelvis: Secondary | ICD-10-CM | POA: Diagnosis not present

## 2016-10-10 DIAGNOSIS — M109 Gout, unspecified: Secondary | ICD-10-CM | POA: Diagnosis not present

## 2016-10-10 DIAGNOSIS — R531 Weakness: Secondary | ICD-10-CM | POA: Diagnosis not present

## 2016-10-10 DIAGNOSIS — J9690 Respiratory failure, unspecified, unspecified whether with hypoxia or hypercapnia: Secondary | ICD-10-CM | POA: Diagnosis not present

## 2016-10-10 DIAGNOSIS — M170 Bilateral primary osteoarthritis of knee: Secondary | ICD-10-CM | POA: Diagnosis not present

## 2016-10-10 DIAGNOSIS — M1712 Unilateral primary osteoarthritis, left knee: Secondary | ICD-10-CM | POA: Diagnosis not present

## 2016-10-10 DIAGNOSIS — M25462 Effusion, left knee: Secondary | ICD-10-CM | POA: Diagnosis not present

## 2016-10-10 DIAGNOSIS — W19XXXA Unspecified fall, initial encounter: Secondary | ICD-10-CM | POA: Diagnosis not present

## 2016-10-10 DIAGNOSIS — Z9981 Dependence on supplemental oxygen: Secondary | ICD-10-CM | POA: Diagnosis not present

## 2016-10-11 DIAGNOSIS — M6281 Muscle weakness (generalized): Secondary | ICD-10-CM | POA: Diagnosis not present

## 2016-10-11 DIAGNOSIS — I10 Essential (primary) hypertension: Secondary | ICD-10-CM | POA: Diagnosis not present

## 2016-10-13 DIAGNOSIS — I10 Essential (primary) hypertension: Secondary | ICD-10-CM | POA: Diagnosis not present

## 2016-10-13 DIAGNOSIS — C642 Malignant neoplasm of left kidney, except renal pelvis: Secondary | ICD-10-CM | POA: Diagnosis not present

## 2016-10-13 DIAGNOSIS — R52 Pain, unspecified: Secondary | ICD-10-CM | POA: Diagnosis not present

## 2016-10-17 DIAGNOSIS — I10 Essential (primary) hypertension: Secondary | ICD-10-CM | POA: Diagnosis not present

## 2016-10-17 DIAGNOSIS — G8929 Other chronic pain: Secondary | ICD-10-CM | POA: Diagnosis not present

## 2016-10-17 DIAGNOSIS — Z48816 Encounter for surgical aftercare following surgery on the genitourinary system: Secondary | ICD-10-CM | POA: Diagnosis not present

## 2016-10-23 DIAGNOSIS — Z48816 Encounter for surgical aftercare following surgery on the genitourinary system: Secondary | ICD-10-CM | POA: Diagnosis not present

## 2016-10-23 DIAGNOSIS — M170 Bilateral primary osteoarthritis of knee: Secondary | ICD-10-CM | POA: Diagnosis not present

## 2016-10-25 DIAGNOSIS — C642 Malignant neoplasm of left kidney, except renal pelvis: Secondary | ICD-10-CM | POA: Diagnosis not present

## 2016-10-27 DIAGNOSIS — C649 Malignant neoplasm of unspecified kidney, except renal pelvis: Secondary | ICD-10-CM | POA: Diagnosis not present

## 2016-10-27 DIAGNOSIS — R531 Weakness: Secondary | ICD-10-CM | POA: Diagnosis not present

## 2016-11-01 DIAGNOSIS — C7492 Malignant neoplasm of unspecified part of left adrenal gland: Secondary | ICD-10-CM | POA: Diagnosis not present

## 2016-11-01 DIAGNOSIS — M19042 Primary osteoarthritis, left hand: Secondary | ICD-10-CM | POA: Diagnosis not present

## 2016-11-01 DIAGNOSIS — M19022 Primary osteoarthritis, left elbow: Secondary | ICD-10-CM | POA: Diagnosis not present

## 2016-11-01 DIAGNOSIS — J9610 Chronic respiratory failure, unspecified whether with hypoxia or hypercapnia: Secondary | ICD-10-CM | POA: Diagnosis not present

## 2016-11-01 DIAGNOSIS — N183 Chronic kidney disease, stage 3 (moderate): Secondary | ICD-10-CM | POA: Diagnosis not present

## 2016-11-01 DIAGNOSIS — Z483 Aftercare following surgery for neoplasm: Secondary | ICD-10-CM | POA: Diagnosis not present

## 2016-11-01 DIAGNOSIS — M1612 Unilateral primary osteoarthritis, left hip: Secondary | ICD-10-CM | POA: Diagnosis not present

## 2016-11-01 DIAGNOSIS — I129 Hypertensive chronic kidney disease with stage 1 through stage 4 chronic kidney disease, or unspecified chronic kidney disease: Secondary | ICD-10-CM | POA: Diagnosis not present

## 2016-11-01 DIAGNOSIS — M19012 Primary osteoarthritis, left shoulder: Secondary | ICD-10-CM | POA: Diagnosis not present

## 2016-11-01 DIAGNOSIS — M1712 Unilateral primary osteoarthritis, left knee: Secondary | ICD-10-CM | POA: Diagnosis not present

## 2016-11-03 DIAGNOSIS — M19042 Primary osteoarthritis, left hand: Secondary | ICD-10-CM | POA: Diagnosis not present

## 2016-11-03 DIAGNOSIS — M19012 Primary osteoarthritis, left shoulder: Secondary | ICD-10-CM | POA: Diagnosis not present

## 2016-11-03 DIAGNOSIS — I129 Hypertensive chronic kidney disease with stage 1 through stage 4 chronic kidney disease, or unspecified chronic kidney disease: Secondary | ICD-10-CM | POA: Diagnosis not present

## 2016-11-03 DIAGNOSIS — J9610 Chronic respiratory failure, unspecified whether with hypoxia or hypercapnia: Secondary | ICD-10-CM | POA: Diagnosis not present

## 2016-11-03 DIAGNOSIS — C7492 Malignant neoplasm of unspecified part of left adrenal gland: Secondary | ICD-10-CM | POA: Diagnosis not present

## 2016-11-03 DIAGNOSIS — M19022 Primary osteoarthritis, left elbow: Secondary | ICD-10-CM | POA: Diagnosis not present

## 2016-11-03 DIAGNOSIS — Z483 Aftercare following surgery for neoplasm: Secondary | ICD-10-CM | POA: Diagnosis not present

## 2016-11-03 DIAGNOSIS — M1712 Unilateral primary osteoarthritis, left knee: Secondary | ICD-10-CM | POA: Diagnosis not present

## 2016-11-03 DIAGNOSIS — N183 Chronic kidney disease, stage 3 (moderate): Secondary | ICD-10-CM | POA: Diagnosis not present

## 2016-11-03 DIAGNOSIS — M1612 Unilateral primary osteoarthritis, left hip: Secondary | ICD-10-CM | POA: Diagnosis not present

## 2016-11-07 DIAGNOSIS — M19022 Primary osteoarthritis, left elbow: Secondary | ICD-10-CM | POA: Diagnosis not present

## 2016-11-07 DIAGNOSIS — N183 Chronic kidney disease, stage 3 (moderate): Secondary | ICD-10-CM | POA: Diagnosis not present

## 2016-11-07 DIAGNOSIS — C7492 Malignant neoplasm of unspecified part of left adrenal gland: Secondary | ICD-10-CM | POA: Diagnosis not present

## 2016-11-07 DIAGNOSIS — J9610 Chronic respiratory failure, unspecified whether with hypoxia or hypercapnia: Secondary | ICD-10-CM | POA: Diagnosis not present

## 2016-11-07 DIAGNOSIS — M19012 Primary osteoarthritis, left shoulder: Secondary | ICD-10-CM | POA: Diagnosis not present

## 2016-11-07 DIAGNOSIS — M1612 Unilateral primary osteoarthritis, left hip: Secondary | ICD-10-CM | POA: Diagnosis not present

## 2016-11-07 DIAGNOSIS — M1712 Unilateral primary osteoarthritis, left knee: Secondary | ICD-10-CM | POA: Diagnosis not present

## 2016-11-07 DIAGNOSIS — Z483 Aftercare following surgery for neoplasm: Secondary | ICD-10-CM | POA: Diagnosis not present

## 2016-11-07 DIAGNOSIS — I129 Hypertensive chronic kidney disease with stage 1 through stage 4 chronic kidney disease, or unspecified chronic kidney disease: Secondary | ICD-10-CM | POA: Diagnosis not present

## 2016-11-07 DIAGNOSIS — M19042 Primary osteoarthritis, left hand: Secondary | ICD-10-CM | POA: Diagnosis not present

## 2016-11-08 DIAGNOSIS — M19022 Primary osteoarthritis, left elbow: Secondary | ICD-10-CM | POA: Diagnosis not present

## 2016-11-08 DIAGNOSIS — M19042 Primary osteoarthritis, left hand: Secondary | ICD-10-CM | POA: Diagnosis not present

## 2016-11-08 DIAGNOSIS — M19012 Primary osteoarthritis, left shoulder: Secondary | ICD-10-CM | POA: Diagnosis not present

## 2016-11-08 DIAGNOSIS — I129 Hypertensive chronic kidney disease with stage 1 through stage 4 chronic kidney disease, or unspecified chronic kidney disease: Secondary | ICD-10-CM | POA: Diagnosis not present

## 2016-11-08 DIAGNOSIS — M1612 Unilateral primary osteoarthritis, left hip: Secondary | ICD-10-CM | POA: Diagnosis not present

## 2016-11-08 DIAGNOSIS — J9610 Chronic respiratory failure, unspecified whether with hypoxia or hypercapnia: Secondary | ICD-10-CM | POA: Diagnosis not present

## 2016-11-08 DIAGNOSIS — M1712 Unilateral primary osteoarthritis, left knee: Secondary | ICD-10-CM | POA: Diagnosis not present

## 2016-11-08 DIAGNOSIS — Z483 Aftercare following surgery for neoplasm: Secondary | ICD-10-CM | POA: Diagnosis not present

## 2016-11-08 DIAGNOSIS — N183 Chronic kidney disease, stage 3 (moderate): Secondary | ICD-10-CM | POA: Diagnosis not present

## 2016-11-08 DIAGNOSIS — C7492 Malignant neoplasm of unspecified part of left adrenal gland: Secondary | ICD-10-CM | POA: Diagnosis not present

## 2016-11-09 DIAGNOSIS — M1712 Unilateral primary osteoarthritis, left knee: Secondary | ICD-10-CM | POA: Diagnosis not present

## 2016-11-09 DIAGNOSIS — C7492 Malignant neoplasm of unspecified part of left adrenal gland: Secondary | ICD-10-CM | POA: Diagnosis not present

## 2016-11-09 DIAGNOSIS — M1612 Unilateral primary osteoarthritis, left hip: Secondary | ICD-10-CM | POA: Diagnosis not present

## 2016-11-09 DIAGNOSIS — M19042 Primary osteoarthritis, left hand: Secondary | ICD-10-CM | POA: Diagnosis not present

## 2016-11-09 DIAGNOSIS — M19012 Primary osteoarthritis, left shoulder: Secondary | ICD-10-CM | POA: Diagnosis not present

## 2016-11-09 DIAGNOSIS — I129 Hypertensive chronic kidney disease with stage 1 through stage 4 chronic kidney disease, or unspecified chronic kidney disease: Secondary | ICD-10-CM | POA: Diagnosis not present

## 2016-11-09 DIAGNOSIS — M19022 Primary osteoarthritis, left elbow: Secondary | ICD-10-CM | POA: Diagnosis not present

## 2016-11-09 DIAGNOSIS — Z483 Aftercare following surgery for neoplasm: Secondary | ICD-10-CM | POA: Diagnosis not present

## 2016-11-09 DIAGNOSIS — J9610 Chronic respiratory failure, unspecified whether with hypoxia or hypercapnia: Secondary | ICD-10-CM | POA: Diagnosis not present

## 2016-11-09 DIAGNOSIS — N183 Chronic kidney disease, stage 3 (moderate): Secondary | ICD-10-CM | POA: Diagnosis not present

## 2016-11-13 DIAGNOSIS — M19012 Primary osteoarthritis, left shoulder: Secondary | ICD-10-CM | POA: Diagnosis not present

## 2016-11-13 DIAGNOSIS — N183 Chronic kidney disease, stage 3 (moderate): Secondary | ICD-10-CM | POA: Diagnosis not present

## 2016-11-13 DIAGNOSIS — M1712 Unilateral primary osteoarthritis, left knee: Secondary | ICD-10-CM | POA: Diagnosis not present

## 2016-11-13 DIAGNOSIS — C7492 Malignant neoplasm of unspecified part of left adrenal gland: Secondary | ICD-10-CM | POA: Diagnosis not present

## 2016-11-13 DIAGNOSIS — M1612 Unilateral primary osteoarthritis, left hip: Secondary | ICD-10-CM | POA: Diagnosis not present

## 2016-11-13 DIAGNOSIS — J9610 Chronic respiratory failure, unspecified whether with hypoxia or hypercapnia: Secondary | ICD-10-CM | POA: Diagnosis not present

## 2016-11-13 DIAGNOSIS — M19042 Primary osteoarthritis, left hand: Secondary | ICD-10-CM | POA: Diagnosis not present

## 2016-11-13 DIAGNOSIS — M19022 Primary osteoarthritis, left elbow: Secondary | ICD-10-CM | POA: Diagnosis not present

## 2016-11-13 DIAGNOSIS — Z483 Aftercare following surgery for neoplasm: Secondary | ICD-10-CM | POA: Diagnosis not present

## 2016-11-13 DIAGNOSIS — I129 Hypertensive chronic kidney disease with stage 1 through stage 4 chronic kidney disease, or unspecified chronic kidney disease: Secondary | ICD-10-CM | POA: Diagnosis not present

## 2016-11-14 DIAGNOSIS — I129 Hypertensive chronic kidney disease with stage 1 through stage 4 chronic kidney disease, or unspecified chronic kidney disease: Secondary | ICD-10-CM | POA: Diagnosis not present

## 2016-11-14 DIAGNOSIS — M1612 Unilateral primary osteoarthritis, left hip: Secondary | ICD-10-CM | POA: Diagnosis not present

## 2016-11-14 DIAGNOSIS — M19042 Primary osteoarthritis, left hand: Secondary | ICD-10-CM | POA: Diagnosis not present

## 2016-11-14 DIAGNOSIS — J9610 Chronic respiratory failure, unspecified whether with hypoxia or hypercapnia: Secondary | ICD-10-CM | POA: Diagnosis not present

## 2016-11-14 DIAGNOSIS — N183 Chronic kidney disease, stage 3 (moderate): Secondary | ICD-10-CM | POA: Diagnosis not present

## 2016-11-14 DIAGNOSIS — M19022 Primary osteoarthritis, left elbow: Secondary | ICD-10-CM | POA: Diagnosis not present

## 2016-11-14 DIAGNOSIS — M19012 Primary osteoarthritis, left shoulder: Secondary | ICD-10-CM | POA: Diagnosis not present

## 2016-11-14 DIAGNOSIS — Z483 Aftercare following surgery for neoplasm: Secondary | ICD-10-CM | POA: Diagnosis not present

## 2016-11-14 DIAGNOSIS — M1712 Unilateral primary osteoarthritis, left knee: Secondary | ICD-10-CM | POA: Diagnosis not present

## 2016-11-14 DIAGNOSIS — C7492 Malignant neoplasm of unspecified part of left adrenal gland: Secondary | ICD-10-CM | POA: Diagnosis not present

## 2016-11-16 DIAGNOSIS — M19012 Primary osteoarthritis, left shoulder: Secondary | ICD-10-CM | POA: Diagnosis not present

## 2016-11-16 DIAGNOSIS — M1612 Unilateral primary osteoarthritis, left hip: Secondary | ICD-10-CM | POA: Diagnosis not present

## 2016-11-16 DIAGNOSIS — Z483 Aftercare following surgery for neoplasm: Secondary | ICD-10-CM | POA: Diagnosis not present

## 2016-11-16 DIAGNOSIS — M1712 Unilateral primary osteoarthritis, left knee: Secondary | ICD-10-CM | POA: Diagnosis not present

## 2016-11-16 DIAGNOSIS — M19022 Primary osteoarthritis, left elbow: Secondary | ICD-10-CM | POA: Diagnosis not present

## 2016-11-16 DIAGNOSIS — I129 Hypertensive chronic kidney disease with stage 1 through stage 4 chronic kidney disease, or unspecified chronic kidney disease: Secondary | ICD-10-CM | POA: Diagnosis not present

## 2016-11-16 DIAGNOSIS — C7492 Malignant neoplasm of unspecified part of left adrenal gland: Secondary | ICD-10-CM | POA: Diagnosis not present

## 2016-11-16 DIAGNOSIS — N183 Chronic kidney disease, stage 3 (moderate): Secondary | ICD-10-CM | POA: Diagnosis not present

## 2016-11-16 DIAGNOSIS — J9610 Chronic respiratory failure, unspecified whether with hypoxia or hypercapnia: Secondary | ICD-10-CM | POA: Diagnosis not present

## 2016-11-16 DIAGNOSIS — M19042 Primary osteoarthritis, left hand: Secondary | ICD-10-CM | POA: Diagnosis not present

## 2016-11-17 DIAGNOSIS — M19012 Primary osteoarthritis, left shoulder: Secondary | ICD-10-CM | POA: Diagnosis not present

## 2016-11-17 DIAGNOSIS — M19042 Primary osteoarthritis, left hand: Secondary | ICD-10-CM | POA: Diagnosis not present

## 2016-11-17 DIAGNOSIS — N183 Chronic kidney disease, stage 3 (moderate): Secondary | ICD-10-CM | POA: Diagnosis not present

## 2016-11-17 DIAGNOSIS — Z483 Aftercare following surgery for neoplasm: Secondary | ICD-10-CM | POA: Diagnosis not present

## 2016-11-17 DIAGNOSIS — M19022 Primary osteoarthritis, left elbow: Secondary | ICD-10-CM | POA: Diagnosis not present

## 2016-11-17 DIAGNOSIS — M1612 Unilateral primary osteoarthritis, left hip: Secondary | ICD-10-CM | POA: Diagnosis not present

## 2016-11-17 DIAGNOSIS — J9610 Chronic respiratory failure, unspecified whether with hypoxia or hypercapnia: Secondary | ICD-10-CM | POA: Diagnosis not present

## 2016-11-17 DIAGNOSIS — C7492 Malignant neoplasm of unspecified part of left adrenal gland: Secondary | ICD-10-CM | POA: Diagnosis not present

## 2016-11-17 DIAGNOSIS — M1712 Unilateral primary osteoarthritis, left knee: Secondary | ICD-10-CM | POA: Diagnosis not present

## 2016-11-17 DIAGNOSIS — I129 Hypertensive chronic kidney disease with stage 1 through stage 4 chronic kidney disease, or unspecified chronic kidney disease: Secondary | ICD-10-CM | POA: Diagnosis not present

## 2016-11-20 DIAGNOSIS — Z483 Aftercare following surgery for neoplasm: Secondary | ICD-10-CM | POA: Diagnosis not present

## 2016-11-20 DIAGNOSIS — M1612 Unilateral primary osteoarthritis, left hip: Secondary | ICD-10-CM | POA: Diagnosis not present

## 2016-11-20 DIAGNOSIS — M19022 Primary osteoarthritis, left elbow: Secondary | ICD-10-CM | POA: Diagnosis not present

## 2016-11-20 DIAGNOSIS — N183 Chronic kidney disease, stage 3 (moderate): Secondary | ICD-10-CM | POA: Diagnosis not present

## 2016-11-20 DIAGNOSIS — I129 Hypertensive chronic kidney disease with stage 1 through stage 4 chronic kidney disease, or unspecified chronic kidney disease: Secondary | ICD-10-CM | POA: Diagnosis not present

## 2016-11-20 DIAGNOSIS — J9610 Chronic respiratory failure, unspecified whether with hypoxia or hypercapnia: Secondary | ICD-10-CM | POA: Diagnosis not present

## 2016-11-20 DIAGNOSIS — M1712 Unilateral primary osteoarthritis, left knee: Secondary | ICD-10-CM | POA: Diagnosis not present

## 2016-11-20 DIAGNOSIS — M19042 Primary osteoarthritis, left hand: Secondary | ICD-10-CM | POA: Diagnosis not present

## 2016-11-20 DIAGNOSIS — M19012 Primary osteoarthritis, left shoulder: Secondary | ICD-10-CM | POA: Diagnosis not present

## 2016-11-20 DIAGNOSIS — C7492 Malignant neoplasm of unspecified part of left adrenal gland: Secondary | ICD-10-CM | POA: Diagnosis not present

## 2016-11-21 DIAGNOSIS — M1712 Unilateral primary osteoarthritis, left knee: Secondary | ICD-10-CM | POA: Diagnosis not present

## 2016-11-21 DIAGNOSIS — M19012 Primary osteoarthritis, left shoulder: Secondary | ICD-10-CM | POA: Diagnosis not present

## 2016-11-21 DIAGNOSIS — M1612 Unilateral primary osteoarthritis, left hip: Secondary | ICD-10-CM | POA: Diagnosis not present

## 2016-11-21 DIAGNOSIS — I129 Hypertensive chronic kidney disease with stage 1 through stage 4 chronic kidney disease, or unspecified chronic kidney disease: Secondary | ICD-10-CM | POA: Diagnosis not present

## 2016-11-21 DIAGNOSIS — J9610 Chronic respiratory failure, unspecified whether with hypoxia or hypercapnia: Secondary | ICD-10-CM | POA: Diagnosis not present

## 2016-11-21 DIAGNOSIS — M19022 Primary osteoarthritis, left elbow: Secondary | ICD-10-CM | POA: Diagnosis not present

## 2016-11-21 DIAGNOSIS — C7492 Malignant neoplasm of unspecified part of left adrenal gland: Secondary | ICD-10-CM | POA: Diagnosis not present

## 2016-11-21 DIAGNOSIS — Z483 Aftercare following surgery for neoplasm: Secondary | ICD-10-CM | POA: Diagnosis not present

## 2016-11-21 DIAGNOSIS — M19042 Primary osteoarthritis, left hand: Secondary | ICD-10-CM | POA: Diagnosis not present

## 2016-11-21 DIAGNOSIS — N183 Chronic kidney disease, stage 3 (moderate): Secondary | ICD-10-CM | POA: Diagnosis not present

## 2016-11-22 DIAGNOSIS — M1712 Unilateral primary osteoarthritis, left knee: Secondary | ICD-10-CM | POA: Diagnosis not present

## 2016-11-22 DIAGNOSIS — I129 Hypertensive chronic kidney disease with stage 1 through stage 4 chronic kidney disease, or unspecified chronic kidney disease: Secondary | ICD-10-CM | POA: Diagnosis not present

## 2016-11-22 DIAGNOSIS — C7492 Malignant neoplasm of unspecified part of left adrenal gland: Secondary | ICD-10-CM | POA: Diagnosis not present

## 2016-11-22 DIAGNOSIS — Z483 Aftercare following surgery for neoplasm: Secondary | ICD-10-CM | POA: Diagnosis not present

## 2016-11-22 DIAGNOSIS — M19022 Primary osteoarthritis, left elbow: Secondary | ICD-10-CM | POA: Diagnosis not present

## 2016-11-22 DIAGNOSIS — N183 Chronic kidney disease, stage 3 (moderate): Secondary | ICD-10-CM | POA: Diagnosis not present

## 2016-11-22 DIAGNOSIS — M19012 Primary osteoarthritis, left shoulder: Secondary | ICD-10-CM | POA: Diagnosis not present

## 2016-11-22 DIAGNOSIS — J9610 Chronic respiratory failure, unspecified whether with hypoxia or hypercapnia: Secondary | ICD-10-CM | POA: Diagnosis not present

## 2016-11-22 DIAGNOSIS — M19042 Primary osteoarthritis, left hand: Secondary | ICD-10-CM | POA: Diagnosis not present

## 2016-11-22 DIAGNOSIS — M1612 Unilateral primary osteoarthritis, left hip: Secondary | ICD-10-CM | POA: Diagnosis not present

## 2016-11-23 DIAGNOSIS — J9610 Chronic respiratory failure, unspecified whether with hypoxia or hypercapnia: Secondary | ICD-10-CM | POA: Diagnosis not present

## 2016-11-23 DIAGNOSIS — C7492 Malignant neoplasm of unspecified part of left adrenal gland: Secondary | ICD-10-CM | POA: Diagnosis not present

## 2016-11-23 DIAGNOSIS — M1612 Unilateral primary osteoarthritis, left hip: Secondary | ICD-10-CM | POA: Diagnosis not present

## 2016-11-23 DIAGNOSIS — Z483 Aftercare following surgery for neoplasm: Secondary | ICD-10-CM | POA: Diagnosis not present

## 2016-11-23 DIAGNOSIS — N183 Chronic kidney disease, stage 3 (moderate): Secondary | ICD-10-CM | POA: Diagnosis not present

## 2016-11-23 DIAGNOSIS — I129 Hypertensive chronic kidney disease with stage 1 through stage 4 chronic kidney disease, or unspecified chronic kidney disease: Secondary | ICD-10-CM | POA: Diagnosis not present

## 2016-11-23 DIAGNOSIS — M19042 Primary osteoarthritis, left hand: Secondary | ICD-10-CM | POA: Diagnosis not present

## 2016-11-23 DIAGNOSIS — M1712 Unilateral primary osteoarthritis, left knee: Secondary | ICD-10-CM | POA: Diagnosis not present

## 2016-11-23 DIAGNOSIS — M19022 Primary osteoarthritis, left elbow: Secondary | ICD-10-CM | POA: Diagnosis not present

## 2016-11-23 DIAGNOSIS — M19012 Primary osteoarthritis, left shoulder: Secondary | ICD-10-CM | POA: Diagnosis not present

## 2016-11-24 DIAGNOSIS — I129 Hypertensive chronic kidney disease with stage 1 through stage 4 chronic kidney disease, or unspecified chronic kidney disease: Secondary | ICD-10-CM | POA: Diagnosis not present

## 2016-11-24 DIAGNOSIS — N183 Chronic kidney disease, stage 3 (moderate): Secondary | ICD-10-CM | POA: Diagnosis not present

## 2016-11-24 DIAGNOSIS — M19042 Primary osteoarthritis, left hand: Secondary | ICD-10-CM | POA: Diagnosis not present

## 2016-11-24 DIAGNOSIS — C7492 Malignant neoplasm of unspecified part of left adrenal gland: Secondary | ICD-10-CM | POA: Diagnosis not present

## 2016-11-24 DIAGNOSIS — M19012 Primary osteoarthritis, left shoulder: Secondary | ICD-10-CM | POA: Diagnosis not present

## 2016-11-24 DIAGNOSIS — M19022 Primary osteoarthritis, left elbow: Secondary | ICD-10-CM | POA: Diagnosis not present

## 2016-11-24 DIAGNOSIS — Z483 Aftercare following surgery for neoplasm: Secondary | ICD-10-CM | POA: Diagnosis not present

## 2016-11-24 DIAGNOSIS — M1612 Unilateral primary osteoarthritis, left hip: Secondary | ICD-10-CM | POA: Diagnosis not present

## 2016-11-24 DIAGNOSIS — M1712 Unilateral primary osteoarthritis, left knee: Secondary | ICD-10-CM | POA: Diagnosis not present

## 2016-11-24 DIAGNOSIS — J9610 Chronic respiratory failure, unspecified whether with hypoxia or hypercapnia: Secondary | ICD-10-CM | POA: Diagnosis not present

## 2016-11-27 DIAGNOSIS — M19022 Primary osteoarthritis, left elbow: Secondary | ICD-10-CM | POA: Diagnosis not present

## 2016-11-27 DIAGNOSIS — M19012 Primary osteoarthritis, left shoulder: Secondary | ICD-10-CM | POA: Diagnosis not present

## 2016-11-27 DIAGNOSIS — I129 Hypertensive chronic kidney disease with stage 1 through stage 4 chronic kidney disease, or unspecified chronic kidney disease: Secondary | ICD-10-CM | POA: Diagnosis not present

## 2016-11-27 DIAGNOSIS — N183 Chronic kidney disease, stage 3 (moderate): Secondary | ICD-10-CM | POA: Diagnosis not present

## 2016-11-27 DIAGNOSIS — M19042 Primary osteoarthritis, left hand: Secondary | ICD-10-CM | POA: Diagnosis not present

## 2016-11-27 DIAGNOSIS — Z483 Aftercare following surgery for neoplasm: Secondary | ICD-10-CM | POA: Diagnosis not present

## 2016-11-27 DIAGNOSIS — M1712 Unilateral primary osteoarthritis, left knee: Secondary | ICD-10-CM | POA: Diagnosis not present

## 2016-11-27 DIAGNOSIS — J9610 Chronic respiratory failure, unspecified whether with hypoxia or hypercapnia: Secondary | ICD-10-CM | POA: Diagnosis not present

## 2016-11-27 DIAGNOSIS — M1612 Unilateral primary osteoarthritis, left hip: Secondary | ICD-10-CM | POA: Diagnosis not present

## 2016-11-27 DIAGNOSIS — C7492 Malignant neoplasm of unspecified part of left adrenal gland: Secondary | ICD-10-CM | POA: Diagnosis not present

## 2016-11-29 DIAGNOSIS — C7492 Malignant neoplasm of unspecified part of left adrenal gland: Secondary | ICD-10-CM | POA: Diagnosis not present

## 2016-11-29 DIAGNOSIS — Z483 Aftercare following surgery for neoplasm: Secondary | ICD-10-CM | POA: Diagnosis not present

## 2016-11-29 DIAGNOSIS — M19022 Primary osteoarthritis, left elbow: Secondary | ICD-10-CM | POA: Diagnosis not present

## 2016-11-29 DIAGNOSIS — M1612 Unilateral primary osteoarthritis, left hip: Secondary | ICD-10-CM | POA: Diagnosis not present

## 2016-11-29 DIAGNOSIS — M19042 Primary osteoarthritis, left hand: Secondary | ICD-10-CM | POA: Diagnosis not present

## 2016-11-29 DIAGNOSIS — M19012 Primary osteoarthritis, left shoulder: Secondary | ICD-10-CM | POA: Diagnosis not present

## 2016-11-29 DIAGNOSIS — N183 Chronic kidney disease, stage 3 (moderate): Secondary | ICD-10-CM | POA: Diagnosis not present

## 2016-11-29 DIAGNOSIS — I129 Hypertensive chronic kidney disease with stage 1 through stage 4 chronic kidney disease, or unspecified chronic kidney disease: Secondary | ICD-10-CM | POA: Diagnosis not present

## 2016-11-29 DIAGNOSIS — M1712 Unilateral primary osteoarthritis, left knee: Secondary | ICD-10-CM | POA: Diagnosis not present

## 2016-11-29 DIAGNOSIS — J9610 Chronic respiratory failure, unspecified whether with hypoxia or hypercapnia: Secondary | ICD-10-CM | POA: Diagnosis not present

## 2016-11-30 DIAGNOSIS — M19012 Primary osteoarthritis, left shoulder: Secondary | ICD-10-CM | POA: Diagnosis not present

## 2016-11-30 DIAGNOSIS — M19022 Primary osteoarthritis, left elbow: Secondary | ICD-10-CM | POA: Diagnosis not present

## 2016-11-30 DIAGNOSIS — M1612 Unilateral primary osteoarthritis, left hip: Secondary | ICD-10-CM | POA: Diagnosis not present

## 2016-11-30 DIAGNOSIS — J9610 Chronic respiratory failure, unspecified whether with hypoxia or hypercapnia: Secondary | ICD-10-CM | POA: Diagnosis not present

## 2016-11-30 DIAGNOSIS — Z483 Aftercare following surgery for neoplasm: Secondary | ICD-10-CM | POA: Diagnosis not present

## 2016-11-30 DIAGNOSIS — I129 Hypertensive chronic kidney disease with stage 1 through stage 4 chronic kidney disease, or unspecified chronic kidney disease: Secondary | ICD-10-CM | POA: Diagnosis not present

## 2016-11-30 DIAGNOSIS — C7492 Malignant neoplasm of unspecified part of left adrenal gland: Secondary | ICD-10-CM | POA: Diagnosis not present

## 2016-11-30 DIAGNOSIS — M1712 Unilateral primary osteoarthritis, left knee: Secondary | ICD-10-CM | POA: Diagnosis not present

## 2016-11-30 DIAGNOSIS — M19042 Primary osteoarthritis, left hand: Secondary | ICD-10-CM | POA: Diagnosis not present

## 2016-11-30 DIAGNOSIS — N183 Chronic kidney disease, stage 3 (moderate): Secondary | ICD-10-CM | POA: Diagnosis not present

## 2016-12-01 DIAGNOSIS — J9610 Chronic respiratory failure, unspecified whether with hypoxia or hypercapnia: Secondary | ICD-10-CM | POA: Diagnosis not present

## 2016-12-01 DIAGNOSIS — C7492 Malignant neoplasm of unspecified part of left adrenal gland: Secondary | ICD-10-CM | POA: Diagnosis not present

## 2016-12-01 DIAGNOSIS — M19042 Primary osteoarthritis, left hand: Secondary | ICD-10-CM | POA: Diagnosis not present

## 2016-12-01 DIAGNOSIS — I129 Hypertensive chronic kidney disease with stage 1 through stage 4 chronic kidney disease, or unspecified chronic kidney disease: Secondary | ICD-10-CM | POA: Diagnosis not present

## 2016-12-01 DIAGNOSIS — M19012 Primary osteoarthritis, left shoulder: Secondary | ICD-10-CM | POA: Diagnosis not present

## 2016-12-01 DIAGNOSIS — N183 Chronic kidney disease, stage 3 (moderate): Secondary | ICD-10-CM | POA: Diagnosis not present

## 2016-12-01 DIAGNOSIS — M1612 Unilateral primary osteoarthritis, left hip: Secondary | ICD-10-CM | POA: Diagnosis not present

## 2016-12-01 DIAGNOSIS — Z483 Aftercare following surgery for neoplasm: Secondary | ICD-10-CM | POA: Diagnosis not present

## 2016-12-01 DIAGNOSIS — M1712 Unilateral primary osteoarthritis, left knee: Secondary | ICD-10-CM | POA: Diagnosis not present

## 2016-12-01 DIAGNOSIS — M19022 Primary osteoarthritis, left elbow: Secondary | ICD-10-CM | POA: Diagnosis not present

## 2016-12-04 DIAGNOSIS — M1612 Unilateral primary osteoarthritis, left hip: Secondary | ICD-10-CM | POA: Diagnosis not present

## 2016-12-04 DIAGNOSIS — J9610 Chronic respiratory failure, unspecified whether with hypoxia or hypercapnia: Secondary | ICD-10-CM | POA: Diagnosis not present

## 2016-12-04 DIAGNOSIS — M19042 Primary osteoarthritis, left hand: Secondary | ICD-10-CM | POA: Diagnosis not present

## 2016-12-04 DIAGNOSIS — M19022 Primary osteoarthritis, left elbow: Secondary | ICD-10-CM | POA: Diagnosis not present

## 2016-12-04 DIAGNOSIS — I129 Hypertensive chronic kidney disease with stage 1 through stage 4 chronic kidney disease, or unspecified chronic kidney disease: Secondary | ICD-10-CM | POA: Diagnosis not present

## 2016-12-04 DIAGNOSIS — M19012 Primary osteoarthritis, left shoulder: Secondary | ICD-10-CM | POA: Diagnosis not present

## 2016-12-04 DIAGNOSIS — C7492 Malignant neoplasm of unspecified part of left adrenal gland: Secondary | ICD-10-CM | POA: Diagnosis not present

## 2016-12-04 DIAGNOSIS — M1712 Unilateral primary osteoarthritis, left knee: Secondary | ICD-10-CM | POA: Diagnosis not present

## 2016-12-04 DIAGNOSIS — N183 Chronic kidney disease, stage 3 (moderate): Secondary | ICD-10-CM | POA: Diagnosis not present

## 2016-12-04 DIAGNOSIS — Z483 Aftercare following surgery for neoplasm: Secondary | ICD-10-CM | POA: Diagnosis not present

## 2016-12-06 DIAGNOSIS — M1712 Unilateral primary osteoarthritis, left knee: Secondary | ICD-10-CM | POA: Diagnosis not present

## 2016-12-06 DIAGNOSIS — M1612 Unilateral primary osteoarthritis, left hip: Secondary | ICD-10-CM | POA: Diagnosis not present

## 2016-12-06 DIAGNOSIS — N183 Chronic kidney disease, stage 3 (moderate): Secondary | ICD-10-CM | POA: Diagnosis not present

## 2016-12-06 DIAGNOSIS — I129 Hypertensive chronic kidney disease with stage 1 through stage 4 chronic kidney disease, or unspecified chronic kidney disease: Secondary | ICD-10-CM | POA: Diagnosis not present

## 2016-12-06 DIAGNOSIS — M19042 Primary osteoarthritis, left hand: Secondary | ICD-10-CM | POA: Diagnosis not present

## 2016-12-06 DIAGNOSIS — J9610 Chronic respiratory failure, unspecified whether with hypoxia or hypercapnia: Secondary | ICD-10-CM | POA: Diagnosis not present

## 2016-12-06 DIAGNOSIS — Z483 Aftercare following surgery for neoplasm: Secondary | ICD-10-CM | POA: Diagnosis not present

## 2016-12-06 DIAGNOSIS — M19012 Primary osteoarthritis, left shoulder: Secondary | ICD-10-CM | POA: Diagnosis not present

## 2016-12-06 DIAGNOSIS — M19022 Primary osteoarthritis, left elbow: Secondary | ICD-10-CM | POA: Diagnosis not present

## 2016-12-06 DIAGNOSIS — C7492 Malignant neoplasm of unspecified part of left adrenal gland: Secondary | ICD-10-CM | POA: Diagnosis not present

## 2016-12-07 DIAGNOSIS — M19042 Primary osteoarthritis, left hand: Secondary | ICD-10-CM | POA: Diagnosis not present

## 2016-12-07 DIAGNOSIS — I129 Hypertensive chronic kidney disease with stage 1 through stage 4 chronic kidney disease, or unspecified chronic kidney disease: Secondary | ICD-10-CM | POA: Diagnosis not present

## 2016-12-07 DIAGNOSIS — J9610 Chronic respiratory failure, unspecified whether with hypoxia or hypercapnia: Secondary | ICD-10-CM | POA: Diagnosis not present

## 2016-12-07 DIAGNOSIS — C7492 Malignant neoplasm of unspecified part of left adrenal gland: Secondary | ICD-10-CM | POA: Diagnosis not present

## 2016-12-07 DIAGNOSIS — N183 Chronic kidney disease, stage 3 (moderate): Secondary | ICD-10-CM | POA: Diagnosis not present

## 2016-12-07 DIAGNOSIS — M1612 Unilateral primary osteoarthritis, left hip: Secondary | ICD-10-CM | POA: Diagnosis not present

## 2016-12-07 DIAGNOSIS — M19012 Primary osteoarthritis, left shoulder: Secondary | ICD-10-CM | POA: Diagnosis not present

## 2016-12-07 DIAGNOSIS — M1712 Unilateral primary osteoarthritis, left knee: Secondary | ICD-10-CM | POA: Diagnosis not present

## 2016-12-07 DIAGNOSIS — M19022 Primary osteoarthritis, left elbow: Secondary | ICD-10-CM | POA: Diagnosis not present

## 2016-12-07 DIAGNOSIS — Z483 Aftercare following surgery for neoplasm: Secondary | ICD-10-CM | POA: Diagnosis not present

## 2016-12-08 DIAGNOSIS — M1612 Unilateral primary osteoarthritis, left hip: Secondary | ICD-10-CM | POA: Diagnosis not present

## 2016-12-08 DIAGNOSIS — J9610 Chronic respiratory failure, unspecified whether with hypoxia or hypercapnia: Secondary | ICD-10-CM | POA: Diagnosis not present

## 2016-12-08 DIAGNOSIS — Z483 Aftercare following surgery for neoplasm: Secondary | ICD-10-CM | POA: Diagnosis not present

## 2016-12-08 DIAGNOSIS — M1712 Unilateral primary osteoarthritis, left knee: Secondary | ICD-10-CM | POA: Diagnosis not present

## 2016-12-08 DIAGNOSIS — C7492 Malignant neoplasm of unspecified part of left adrenal gland: Secondary | ICD-10-CM | POA: Diagnosis not present

## 2016-12-08 DIAGNOSIS — M19022 Primary osteoarthritis, left elbow: Secondary | ICD-10-CM | POA: Diagnosis not present

## 2016-12-08 DIAGNOSIS — I129 Hypertensive chronic kidney disease with stage 1 through stage 4 chronic kidney disease, or unspecified chronic kidney disease: Secondary | ICD-10-CM | POA: Diagnosis not present

## 2016-12-08 DIAGNOSIS — N183 Chronic kidney disease, stage 3 (moderate): Secondary | ICD-10-CM | POA: Diagnosis not present

## 2016-12-08 DIAGNOSIS — M19012 Primary osteoarthritis, left shoulder: Secondary | ICD-10-CM | POA: Diagnosis not present

## 2016-12-08 DIAGNOSIS — M19042 Primary osteoarthritis, left hand: Secondary | ICD-10-CM | POA: Diagnosis not present

## 2016-12-11 DIAGNOSIS — M19012 Primary osteoarthritis, left shoulder: Secondary | ICD-10-CM | POA: Diagnosis not present

## 2016-12-11 DIAGNOSIS — Z483 Aftercare following surgery for neoplasm: Secondary | ICD-10-CM | POA: Diagnosis not present

## 2016-12-11 DIAGNOSIS — M1712 Unilateral primary osteoarthritis, left knee: Secondary | ICD-10-CM | POA: Diagnosis not present

## 2016-12-11 DIAGNOSIS — J9610 Chronic respiratory failure, unspecified whether with hypoxia or hypercapnia: Secondary | ICD-10-CM | POA: Diagnosis not present

## 2016-12-11 DIAGNOSIS — I129 Hypertensive chronic kidney disease with stage 1 through stage 4 chronic kidney disease, or unspecified chronic kidney disease: Secondary | ICD-10-CM | POA: Diagnosis not present

## 2016-12-11 DIAGNOSIS — M1612 Unilateral primary osteoarthritis, left hip: Secondary | ICD-10-CM | POA: Diagnosis not present

## 2016-12-11 DIAGNOSIS — N183 Chronic kidney disease, stage 3 (moderate): Secondary | ICD-10-CM | POA: Diagnosis not present

## 2016-12-11 DIAGNOSIS — M19042 Primary osteoarthritis, left hand: Secondary | ICD-10-CM | POA: Diagnosis not present

## 2016-12-11 DIAGNOSIS — C7492 Malignant neoplasm of unspecified part of left adrenal gland: Secondary | ICD-10-CM | POA: Diagnosis not present

## 2016-12-11 DIAGNOSIS — M19022 Primary osteoarthritis, left elbow: Secondary | ICD-10-CM | POA: Diagnosis not present

## 2016-12-13 DIAGNOSIS — M19022 Primary osteoarthritis, left elbow: Secondary | ICD-10-CM | POA: Diagnosis not present

## 2016-12-13 DIAGNOSIS — C7492 Malignant neoplasm of unspecified part of left adrenal gland: Secondary | ICD-10-CM | POA: Diagnosis not present

## 2016-12-13 DIAGNOSIS — J9610 Chronic respiratory failure, unspecified whether with hypoxia or hypercapnia: Secondary | ICD-10-CM | POA: Diagnosis not present

## 2016-12-13 DIAGNOSIS — I129 Hypertensive chronic kidney disease with stage 1 through stage 4 chronic kidney disease, or unspecified chronic kidney disease: Secondary | ICD-10-CM | POA: Diagnosis not present

## 2016-12-13 DIAGNOSIS — M1712 Unilateral primary osteoarthritis, left knee: Secondary | ICD-10-CM | POA: Diagnosis not present

## 2016-12-13 DIAGNOSIS — Z483 Aftercare following surgery for neoplasm: Secondary | ICD-10-CM | POA: Diagnosis not present

## 2016-12-13 DIAGNOSIS — M1612 Unilateral primary osteoarthritis, left hip: Secondary | ICD-10-CM | POA: Diagnosis not present

## 2016-12-13 DIAGNOSIS — M19042 Primary osteoarthritis, left hand: Secondary | ICD-10-CM | POA: Diagnosis not present

## 2016-12-13 DIAGNOSIS — N183 Chronic kidney disease, stage 3 (moderate): Secondary | ICD-10-CM | POA: Diagnosis not present

## 2016-12-13 DIAGNOSIS — M19012 Primary osteoarthritis, left shoulder: Secondary | ICD-10-CM | POA: Diagnosis not present

## 2016-12-15 DIAGNOSIS — M19012 Primary osteoarthritis, left shoulder: Secondary | ICD-10-CM | POA: Diagnosis not present

## 2016-12-15 DIAGNOSIS — C7492 Malignant neoplasm of unspecified part of left adrenal gland: Secondary | ICD-10-CM | POA: Diagnosis not present

## 2016-12-15 DIAGNOSIS — N183 Chronic kidney disease, stage 3 (moderate): Secondary | ICD-10-CM | POA: Diagnosis not present

## 2016-12-15 DIAGNOSIS — Z483 Aftercare following surgery for neoplasm: Secondary | ICD-10-CM | POA: Diagnosis not present

## 2016-12-15 DIAGNOSIS — M1712 Unilateral primary osteoarthritis, left knee: Secondary | ICD-10-CM | POA: Diagnosis not present

## 2016-12-15 DIAGNOSIS — M19022 Primary osteoarthritis, left elbow: Secondary | ICD-10-CM | POA: Diagnosis not present

## 2016-12-15 DIAGNOSIS — J9610 Chronic respiratory failure, unspecified whether with hypoxia or hypercapnia: Secondary | ICD-10-CM | POA: Diagnosis not present

## 2016-12-15 DIAGNOSIS — I129 Hypertensive chronic kidney disease with stage 1 through stage 4 chronic kidney disease, or unspecified chronic kidney disease: Secondary | ICD-10-CM | POA: Diagnosis not present

## 2016-12-15 DIAGNOSIS — M1612 Unilateral primary osteoarthritis, left hip: Secondary | ICD-10-CM | POA: Diagnosis not present

## 2016-12-15 DIAGNOSIS — M19042 Primary osteoarthritis, left hand: Secondary | ICD-10-CM | POA: Diagnosis not present

## 2016-12-18 DIAGNOSIS — M19012 Primary osteoarthritis, left shoulder: Secondary | ICD-10-CM | POA: Diagnosis not present

## 2016-12-18 DIAGNOSIS — I129 Hypertensive chronic kidney disease with stage 1 through stage 4 chronic kidney disease, or unspecified chronic kidney disease: Secondary | ICD-10-CM | POA: Diagnosis not present

## 2016-12-18 DIAGNOSIS — Z483 Aftercare following surgery for neoplasm: Secondary | ICD-10-CM | POA: Diagnosis not present

## 2016-12-18 DIAGNOSIS — M1612 Unilateral primary osteoarthritis, left hip: Secondary | ICD-10-CM | POA: Diagnosis not present

## 2016-12-18 DIAGNOSIS — J9610 Chronic respiratory failure, unspecified whether with hypoxia or hypercapnia: Secondary | ICD-10-CM | POA: Diagnosis not present

## 2016-12-18 DIAGNOSIS — N183 Chronic kidney disease, stage 3 (moderate): Secondary | ICD-10-CM | POA: Diagnosis not present

## 2016-12-18 DIAGNOSIS — M19042 Primary osteoarthritis, left hand: Secondary | ICD-10-CM | POA: Diagnosis not present

## 2016-12-18 DIAGNOSIS — C7492 Malignant neoplasm of unspecified part of left adrenal gland: Secondary | ICD-10-CM | POA: Diagnosis not present

## 2016-12-18 DIAGNOSIS — M1712 Unilateral primary osteoarthritis, left knee: Secondary | ICD-10-CM | POA: Diagnosis not present

## 2016-12-18 DIAGNOSIS — M19022 Primary osteoarthritis, left elbow: Secondary | ICD-10-CM | POA: Diagnosis not present

## 2016-12-20 DIAGNOSIS — M1712 Unilateral primary osteoarthritis, left knee: Secondary | ICD-10-CM | POA: Diagnosis not present

## 2016-12-20 DIAGNOSIS — N183 Chronic kidney disease, stage 3 (moderate): Secondary | ICD-10-CM | POA: Diagnosis not present

## 2016-12-20 DIAGNOSIS — I129 Hypertensive chronic kidney disease with stage 1 through stage 4 chronic kidney disease, or unspecified chronic kidney disease: Secondary | ICD-10-CM | POA: Diagnosis not present

## 2016-12-20 DIAGNOSIS — M1612 Unilateral primary osteoarthritis, left hip: Secondary | ICD-10-CM | POA: Diagnosis not present

## 2016-12-20 DIAGNOSIS — M19042 Primary osteoarthritis, left hand: Secondary | ICD-10-CM | POA: Diagnosis not present

## 2016-12-20 DIAGNOSIS — Z483 Aftercare following surgery for neoplasm: Secondary | ICD-10-CM | POA: Diagnosis not present

## 2016-12-20 DIAGNOSIS — C7492 Malignant neoplasm of unspecified part of left adrenal gland: Secondary | ICD-10-CM | POA: Diagnosis not present

## 2016-12-20 DIAGNOSIS — M19022 Primary osteoarthritis, left elbow: Secondary | ICD-10-CM | POA: Diagnosis not present

## 2016-12-20 DIAGNOSIS — M19012 Primary osteoarthritis, left shoulder: Secondary | ICD-10-CM | POA: Diagnosis not present

## 2016-12-20 DIAGNOSIS — J9610 Chronic respiratory failure, unspecified whether with hypoxia or hypercapnia: Secondary | ICD-10-CM | POA: Diagnosis not present

## 2016-12-22 DIAGNOSIS — C7492 Malignant neoplasm of unspecified part of left adrenal gland: Secondary | ICD-10-CM | POA: Diagnosis not present

## 2016-12-22 DIAGNOSIS — M19012 Primary osteoarthritis, left shoulder: Secondary | ICD-10-CM | POA: Diagnosis not present

## 2016-12-22 DIAGNOSIS — I129 Hypertensive chronic kidney disease with stage 1 through stage 4 chronic kidney disease, or unspecified chronic kidney disease: Secondary | ICD-10-CM | POA: Diagnosis not present

## 2016-12-22 DIAGNOSIS — M1712 Unilateral primary osteoarthritis, left knee: Secondary | ICD-10-CM | POA: Diagnosis not present

## 2016-12-22 DIAGNOSIS — M19042 Primary osteoarthritis, left hand: Secondary | ICD-10-CM | POA: Diagnosis not present

## 2016-12-22 DIAGNOSIS — M19022 Primary osteoarthritis, left elbow: Secondary | ICD-10-CM | POA: Diagnosis not present

## 2016-12-22 DIAGNOSIS — N183 Chronic kidney disease, stage 3 (moderate): Secondary | ICD-10-CM | POA: Diagnosis not present

## 2016-12-22 DIAGNOSIS — M1612 Unilateral primary osteoarthritis, left hip: Secondary | ICD-10-CM | POA: Diagnosis not present

## 2016-12-22 DIAGNOSIS — J9610 Chronic respiratory failure, unspecified whether with hypoxia or hypercapnia: Secondary | ICD-10-CM | POA: Diagnosis not present

## 2016-12-22 DIAGNOSIS — Z483 Aftercare following surgery for neoplasm: Secondary | ICD-10-CM | POA: Diagnosis not present

## 2016-12-25 DIAGNOSIS — J9610 Chronic respiratory failure, unspecified whether with hypoxia or hypercapnia: Secondary | ICD-10-CM | POA: Diagnosis not present

## 2016-12-25 DIAGNOSIS — N183 Chronic kidney disease, stage 3 (moderate): Secondary | ICD-10-CM | POA: Diagnosis not present

## 2016-12-25 DIAGNOSIS — M1712 Unilateral primary osteoarthritis, left knee: Secondary | ICD-10-CM | POA: Diagnosis not present

## 2016-12-25 DIAGNOSIS — M19042 Primary osteoarthritis, left hand: Secondary | ICD-10-CM | POA: Diagnosis not present

## 2016-12-25 DIAGNOSIS — I129 Hypertensive chronic kidney disease with stage 1 through stage 4 chronic kidney disease, or unspecified chronic kidney disease: Secondary | ICD-10-CM | POA: Diagnosis not present

## 2016-12-25 DIAGNOSIS — M19012 Primary osteoarthritis, left shoulder: Secondary | ICD-10-CM | POA: Diagnosis not present

## 2016-12-25 DIAGNOSIS — M19022 Primary osteoarthritis, left elbow: Secondary | ICD-10-CM | POA: Diagnosis not present

## 2016-12-25 DIAGNOSIS — C7492 Malignant neoplasm of unspecified part of left adrenal gland: Secondary | ICD-10-CM | POA: Diagnosis not present

## 2016-12-25 DIAGNOSIS — M1612 Unilateral primary osteoarthritis, left hip: Secondary | ICD-10-CM | POA: Diagnosis not present

## 2016-12-25 DIAGNOSIS — Z483 Aftercare following surgery for neoplasm: Secondary | ICD-10-CM | POA: Diagnosis not present

## 2016-12-28 DIAGNOSIS — Z483 Aftercare following surgery for neoplasm: Secondary | ICD-10-CM | POA: Diagnosis not present

## 2016-12-28 DIAGNOSIS — C7492 Malignant neoplasm of unspecified part of left adrenal gland: Secondary | ICD-10-CM | POA: Diagnosis not present

## 2016-12-28 DIAGNOSIS — M19012 Primary osteoarthritis, left shoulder: Secondary | ICD-10-CM | POA: Diagnosis not present

## 2016-12-28 DIAGNOSIS — J9610 Chronic respiratory failure, unspecified whether with hypoxia or hypercapnia: Secondary | ICD-10-CM | POA: Diagnosis not present

## 2016-12-28 DIAGNOSIS — I129 Hypertensive chronic kidney disease with stage 1 through stage 4 chronic kidney disease, or unspecified chronic kidney disease: Secondary | ICD-10-CM | POA: Diagnosis not present

## 2016-12-28 DIAGNOSIS — N183 Chronic kidney disease, stage 3 (moderate): Secondary | ICD-10-CM | POA: Diagnosis not present

## 2016-12-28 DIAGNOSIS — M19042 Primary osteoarthritis, left hand: Secondary | ICD-10-CM | POA: Diagnosis not present

## 2016-12-28 DIAGNOSIS — M1612 Unilateral primary osteoarthritis, left hip: Secondary | ICD-10-CM | POA: Diagnosis not present

## 2016-12-28 DIAGNOSIS — M19022 Primary osteoarthritis, left elbow: Secondary | ICD-10-CM | POA: Diagnosis not present

## 2016-12-28 DIAGNOSIS — M1712 Unilateral primary osteoarthritis, left knee: Secondary | ICD-10-CM | POA: Diagnosis not present

## 2016-12-29 DIAGNOSIS — M19022 Primary osteoarthritis, left elbow: Secondary | ICD-10-CM | POA: Diagnosis not present

## 2016-12-29 DIAGNOSIS — M19042 Primary osteoarthritis, left hand: Secondary | ICD-10-CM | POA: Diagnosis not present

## 2016-12-29 DIAGNOSIS — I129 Hypertensive chronic kidney disease with stage 1 through stage 4 chronic kidney disease, or unspecified chronic kidney disease: Secondary | ICD-10-CM | POA: Diagnosis not present

## 2016-12-29 DIAGNOSIS — N183 Chronic kidney disease, stage 3 (moderate): Secondary | ICD-10-CM | POA: Diagnosis not present

## 2016-12-29 DIAGNOSIS — M19012 Primary osteoarthritis, left shoulder: Secondary | ICD-10-CM | POA: Diagnosis not present

## 2016-12-29 DIAGNOSIS — J9610 Chronic respiratory failure, unspecified whether with hypoxia or hypercapnia: Secondary | ICD-10-CM | POA: Diagnosis not present

## 2016-12-29 DIAGNOSIS — C7492 Malignant neoplasm of unspecified part of left adrenal gland: Secondary | ICD-10-CM | POA: Diagnosis not present

## 2016-12-29 DIAGNOSIS — Z483 Aftercare following surgery for neoplasm: Secondary | ICD-10-CM | POA: Diagnosis not present

## 2016-12-29 DIAGNOSIS — M1712 Unilateral primary osteoarthritis, left knee: Secondary | ICD-10-CM | POA: Diagnosis not present

## 2016-12-29 DIAGNOSIS — M1612 Unilateral primary osteoarthritis, left hip: Secondary | ICD-10-CM | POA: Diagnosis not present

## 2016-12-31 DIAGNOSIS — M1712 Unilateral primary osteoarthritis, left knee: Secondary | ICD-10-CM | POA: Diagnosis not present

## 2016-12-31 DIAGNOSIS — M19012 Primary osteoarthritis, left shoulder: Secondary | ICD-10-CM | POA: Diagnosis not present

## 2016-12-31 DIAGNOSIS — N183 Chronic kidney disease, stage 3 (moderate): Secondary | ICD-10-CM | POA: Diagnosis not present

## 2016-12-31 DIAGNOSIS — I129 Hypertensive chronic kidney disease with stage 1 through stage 4 chronic kidney disease, or unspecified chronic kidney disease: Secondary | ICD-10-CM | POA: Diagnosis not present

## 2017-01-01 DIAGNOSIS — J9610 Chronic respiratory failure, unspecified whether with hypoxia or hypercapnia: Secondary | ICD-10-CM | POA: Diagnosis not present

## 2017-01-01 DIAGNOSIS — M19022 Primary osteoarthritis, left elbow: Secondary | ICD-10-CM | POA: Diagnosis not present

## 2017-01-01 DIAGNOSIS — Z483 Aftercare following surgery for neoplasm: Secondary | ICD-10-CM | POA: Diagnosis not present

## 2017-01-01 DIAGNOSIS — N183 Chronic kidney disease, stage 3 (moderate): Secondary | ICD-10-CM | POA: Diagnosis not present

## 2017-01-01 DIAGNOSIS — M1612 Unilateral primary osteoarthritis, left hip: Secondary | ICD-10-CM | POA: Diagnosis not present

## 2017-01-01 DIAGNOSIS — M19042 Primary osteoarthritis, left hand: Secondary | ICD-10-CM | POA: Diagnosis not present

## 2017-01-01 DIAGNOSIS — M19012 Primary osteoarthritis, left shoulder: Secondary | ICD-10-CM | POA: Diagnosis not present

## 2017-01-01 DIAGNOSIS — C7492 Malignant neoplasm of unspecified part of left adrenal gland: Secondary | ICD-10-CM | POA: Diagnosis not present

## 2017-01-01 DIAGNOSIS — I129 Hypertensive chronic kidney disease with stage 1 through stage 4 chronic kidney disease, or unspecified chronic kidney disease: Secondary | ICD-10-CM | POA: Diagnosis not present

## 2017-01-01 DIAGNOSIS — M1712 Unilateral primary osteoarthritis, left knee: Secondary | ICD-10-CM | POA: Diagnosis not present

## 2017-01-03 DIAGNOSIS — M19022 Primary osteoarthritis, left elbow: Secondary | ICD-10-CM | POA: Diagnosis not present

## 2017-01-03 DIAGNOSIS — M1612 Unilateral primary osteoarthritis, left hip: Secondary | ICD-10-CM | POA: Diagnosis not present

## 2017-01-03 DIAGNOSIS — Z483 Aftercare following surgery for neoplasm: Secondary | ICD-10-CM | POA: Diagnosis not present

## 2017-01-03 DIAGNOSIS — M1712 Unilateral primary osteoarthritis, left knee: Secondary | ICD-10-CM | POA: Diagnosis not present

## 2017-01-03 DIAGNOSIS — C7492 Malignant neoplasm of unspecified part of left adrenal gland: Secondary | ICD-10-CM | POA: Diagnosis not present

## 2017-01-03 DIAGNOSIS — J9610 Chronic respiratory failure, unspecified whether with hypoxia or hypercapnia: Secondary | ICD-10-CM | POA: Diagnosis not present

## 2017-01-03 DIAGNOSIS — M19012 Primary osteoarthritis, left shoulder: Secondary | ICD-10-CM | POA: Diagnosis not present

## 2017-01-03 DIAGNOSIS — N183 Chronic kidney disease, stage 3 (moderate): Secondary | ICD-10-CM | POA: Diagnosis not present

## 2017-01-03 DIAGNOSIS — M19042 Primary osteoarthritis, left hand: Secondary | ICD-10-CM | POA: Diagnosis not present

## 2017-01-03 DIAGNOSIS — I129 Hypertensive chronic kidney disease with stage 1 through stage 4 chronic kidney disease, or unspecified chronic kidney disease: Secondary | ICD-10-CM | POA: Diagnosis not present

## 2017-01-05 DIAGNOSIS — M19012 Primary osteoarthritis, left shoulder: Secondary | ICD-10-CM | POA: Diagnosis not present

## 2017-01-05 DIAGNOSIS — C7492 Malignant neoplasm of unspecified part of left adrenal gland: Secondary | ICD-10-CM | POA: Diagnosis not present

## 2017-01-05 DIAGNOSIS — Z483 Aftercare following surgery for neoplasm: Secondary | ICD-10-CM | POA: Diagnosis not present

## 2017-01-05 DIAGNOSIS — J9610 Chronic respiratory failure, unspecified whether with hypoxia or hypercapnia: Secondary | ICD-10-CM | POA: Diagnosis not present

## 2017-01-05 DIAGNOSIS — N183 Chronic kidney disease, stage 3 (moderate): Secondary | ICD-10-CM | POA: Diagnosis not present

## 2017-01-05 DIAGNOSIS — M1712 Unilateral primary osteoarthritis, left knee: Secondary | ICD-10-CM | POA: Diagnosis not present

## 2017-01-05 DIAGNOSIS — M19022 Primary osteoarthritis, left elbow: Secondary | ICD-10-CM | POA: Diagnosis not present

## 2017-01-05 DIAGNOSIS — I129 Hypertensive chronic kidney disease with stage 1 through stage 4 chronic kidney disease, or unspecified chronic kidney disease: Secondary | ICD-10-CM | POA: Diagnosis not present

## 2017-01-05 DIAGNOSIS — M19042 Primary osteoarthritis, left hand: Secondary | ICD-10-CM | POA: Diagnosis not present

## 2017-01-05 DIAGNOSIS — M1612 Unilateral primary osteoarthritis, left hip: Secondary | ICD-10-CM | POA: Diagnosis not present

## 2017-01-08 DIAGNOSIS — M1712 Unilateral primary osteoarthritis, left knee: Secondary | ICD-10-CM | POA: Diagnosis not present

## 2017-01-08 DIAGNOSIS — M19022 Primary osteoarthritis, left elbow: Secondary | ICD-10-CM | POA: Diagnosis not present

## 2017-01-08 DIAGNOSIS — M1612 Unilateral primary osteoarthritis, left hip: Secondary | ICD-10-CM | POA: Diagnosis not present

## 2017-01-08 DIAGNOSIS — M19012 Primary osteoarthritis, left shoulder: Secondary | ICD-10-CM | POA: Diagnosis not present

## 2017-01-08 DIAGNOSIS — Z483 Aftercare following surgery for neoplasm: Secondary | ICD-10-CM | POA: Diagnosis not present

## 2017-01-08 DIAGNOSIS — C7492 Malignant neoplasm of unspecified part of left adrenal gland: Secondary | ICD-10-CM | POA: Diagnosis not present

## 2017-01-08 DIAGNOSIS — J9610 Chronic respiratory failure, unspecified whether with hypoxia or hypercapnia: Secondary | ICD-10-CM | POA: Diagnosis not present

## 2017-01-08 DIAGNOSIS — N183 Chronic kidney disease, stage 3 (moderate): Secondary | ICD-10-CM | POA: Diagnosis not present

## 2017-01-08 DIAGNOSIS — M19042 Primary osteoarthritis, left hand: Secondary | ICD-10-CM | POA: Diagnosis not present

## 2017-01-08 DIAGNOSIS — I129 Hypertensive chronic kidney disease with stage 1 through stage 4 chronic kidney disease, or unspecified chronic kidney disease: Secondary | ICD-10-CM | POA: Diagnosis not present

## 2017-01-09 DIAGNOSIS — Z483 Aftercare following surgery for neoplasm: Secondary | ICD-10-CM | POA: Diagnosis not present

## 2017-01-09 DIAGNOSIS — M1612 Unilateral primary osteoarthritis, left hip: Secondary | ICD-10-CM | POA: Diagnosis not present

## 2017-01-09 DIAGNOSIS — C7492 Malignant neoplasm of unspecified part of left adrenal gland: Secondary | ICD-10-CM | POA: Diagnosis not present

## 2017-01-09 DIAGNOSIS — M1712 Unilateral primary osteoarthritis, left knee: Secondary | ICD-10-CM | POA: Diagnosis not present

## 2017-01-09 DIAGNOSIS — M19022 Primary osteoarthritis, left elbow: Secondary | ICD-10-CM | POA: Diagnosis not present

## 2017-01-09 DIAGNOSIS — N183 Chronic kidney disease, stage 3 (moderate): Secondary | ICD-10-CM | POA: Diagnosis not present

## 2017-01-09 DIAGNOSIS — M19012 Primary osteoarthritis, left shoulder: Secondary | ICD-10-CM | POA: Diagnosis not present

## 2017-01-09 DIAGNOSIS — J9610 Chronic respiratory failure, unspecified whether with hypoxia or hypercapnia: Secondary | ICD-10-CM | POA: Diagnosis not present

## 2017-01-09 DIAGNOSIS — I129 Hypertensive chronic kidney disease with stage 1 through stage 4 chronic kidney disease, or unspecified chronic kidney disease: Secondary | ICD-10-CM | POA: Diagnosis not present

## 2017-01-09 DIAGNOSIS — M19042 Primary osteoarthritis, left hand: Secondary | ICD-10-CM | POA: Diagnosis not present

## 2017-01-10 DIAGNOSIS — Z483 Aftercare following surgery for neoplasm: Secondary | ICD-10-CM | POA: Diagnosis not present

## 2017-01-10 DIAGNOSIS — M1612 Unilateral primary osteoarthritis, left hip: Secondary | ICD-10-CM | POA: Diagnosis not present

## 2017-01-10 DIAGNOSIS — M1712 Unilateral primary osteoarthritis, left knee: Secondary | ICD-10-CM | POA: Diagnosis not present

## 2017-01-10 DIAGNOSIS — M19022 Primary osteoarthritis, left elbow: Secondary | ICD-10-CM | POA: Diagnosis not present

## 2017-01-10 DIAGNOSIS — M19012 Primary osteoarthritis, left shoulder: Secondary | ICD-10-CM | POA: Diagnosis not present

## 2017-01-10 DIAGNOSIS — J9610 Chronic respiratory failure, unspecified whether with hypoxia or hypercapnia: Secondary | ICD-10-CM | POA: Diagnosis not present

## 2017-01-10 DIAGNOSIS — I129 Hypertensive chronic kidney disease with stage 1 through stage 4 chronic kidney disease, or unspecified chronic kidney disease: Secondary | ICD-10-CM | POA: Diagnosis not present

## 2017-01-10 DIAGNOSIS — M19042 Primary osteoarthritis, left hand: Secondary | ICD-10-CM | POA: Diagnosis not present

## 2017-01-10 DIAGNOSIS — C7492 Malignant neoplasm of unspecified part of left adrenal gland: Secondary | ICD-10-CM | POA: Diagnosis not present

## 2017-01-10 DIAGNOSIS — N183 Chronic kidney disease, stage 3 (moderate): Secondary | ICD-10-CM | POA: Diagnosis not present

## 2017-01-23 DIAGNOSIS — M19012 Primary osteoarthritis, left shoulder: Secondary | ICD-10-CM | POA: Diagnosis not present

## 2017-01-23 DIAGNOSIS — Z483 Aftercare following surgery for neoplasm: Secondary | ICD-10-CM | POA: Diagnosis not present

## 2017-01-23 DIAGNOSIS — J9610 Chronic respiratory failure, unspecified whether with hypoxia or hypercapnia: Secondary | ICD-10-CM | POA: Diagnosis not present

## 2017-01-23 DIAGNOSIS — M1712 Unilateral primary osteoarthritis, left knee: Secondary | ICD-10-CM | POA: Diagnosis not present

## 2017-01-23 DIAGNOSIS — N183 Chronic kidney disease, stage 3 (moderate): Secondary | ICD-10-CM | POA: Diagnosis not present

## 2017-01-23 DIAGNOSIS — M1612 Unilateral primary osteoarthritis, left hip: Secondary | ICD-10-CM | POA: Diagnosis not present

## 2017-01-23 DIAGNOSIS — M19022 Primary osteoarthritis, left elbow: Secondary | ICD-10-CM | POA: Diagnosis not present

## 2017-01-23 DIAGNOSIS — C7492 Malignant neoplasm of unspecified part of left adrenal gland: Secondary | ICD-10-CM | POA: Diagnosis not present

## 2017-01-23 DIAGNOSIS — M19042 Primary osteoarthritis, left hand: Secondary | ICD-10-CM | POA: Diagnosis not present

## 2017-01-23 DIAGNOSIS — I129 Hypertensive chronic kidney disease with stage 1 through stage 4 chronic kidney disease, or unspecified chronic kidney disease: Secondary | ICD-10-CM | POA: Diagnosis not present

## 2017-01-24 DIAGNOSIS — M1612 Unilateral primary osteoarthritis, left hip: Secondary | ICD-10-CM | POA: Diagnosis not present

## 2017-01-24 DIAGNOSIS — C7492 Malignant neoplasm of unspecified part of left adrenal gland: Secondary | ICD-10-CM | POA: Diagnosis not present

## 2017-01-24 DIAGNOSIS — N183 Chronic kidney disease, stage 3 (moderate): Secondary | ICD-10-CM | POA: Diagnosis not present

## 2017-01-24 DIAGNOSIS — M19012 Primary osteoarthritis, left shoulder: Secondary | ICD-10-CM | POA: Diagnosis not present

## 2017-01-24 DIAGNOSIS — Z483 Aftercare following surgery for neoplasm: Secondary | ICD-10-CM | POA: Diagnosis not present

## 2017-01-24 DIAGNOSIS — I129 Hypertensive chronic kidney disease with stage 1 through stage 4 chronic kidney disease, or unspecified chronic kidney disease: Secondary | ICD-10-CM | POA: Diagnosis not present

## 2017-01-24 DIAGNOSIS — M19022 Primary osteoarthritis, left elbow: Secondary | ICD-10-CM | POA: Diagnosis not present

## 2017-01-24 DIAGNOSIS — M1712 Unilateral primary osteoarthritis, left knee: Secondary | ICD-10-CM | POA: Diagnosis not present

## 2017-01-24 DIAGNOSIS — J9610 Chronic respiratory failure, unspecified whether with hypoxia or hypercapnia: Secondary | ICD-10-CM | POA: Diagnosis not present

## 2017-01-24 DIAGNOSIS — M19042 Primary osteoarthritis, left hand: Secondary | ICD-10-CM | POA: Diagnosis not present

## 2017-01-25 DIAGNOSIS — M1990 Unspecified osteoarthritis, unspecified site: Secondary | ICD-10-CM | POA: Diagnosis not present

## 2017-01-25 DIAGNOSIS — R269 Unspecified abnormalities of gait and mobility: Secondary | ICD-10-CM | POA: Diagnosis not present

## 2017-01-25 DIAGNOSIS — M6281 Muscle weakness (generalized): Secondary | ICD-10-CM | POA: Diagnosis not present

## 2017-01-26 DIAGNOSIS — M1612 Unilateral primary osteoarthritis, left hip: Secondary | ICD-10-CM | POA: Diagnosis not present

## 2017-01-26 DIAGNOSIS — C7492 Malignant neoplasm of unspecified part of left adrenal gland: Secondary | ICD-10-CM | POA: Diagnosis not present

## 2017-01-26 DIAGNOSIS — N183 Chronic kidney disease, stage 3 (moderate): Secondary | ICD-10-CM | POA: Diagnosis not present

## 2017-01-26 DIAGNOSIS — M19012 Primary osteoarthritis, left shoulder: Secondary | ICD-10-CM | POA: Diagnosis not present

## 2017-01-26 DIAGNOSIS — M19042 Primary osteoarthritis, left hand: Secondary | ICD-10-CM | POA: Diagnosis not present

## 2017-01-26 DIAGNOSIS — I129 Hypertensive chronic kidney disease with stage 1 through stage 4 chronic kidney disease, or unspecified chronic kidney disease: Secondary | ICD-10-CM | POA: Diagnosis not present

## 2017-01-26 DIAGNOSIS — J9610 Chronic respiratory failure, unspecified whether with hypoxia or hypercapnia: Secondary | ICD-10-CM | POA: Diagnosis not present

## 2017-01-26 DIAGNOSIS — M19022 Primary osteoarthritis, left elbow: Secondary | ICD-10-CM | POA: Diagnosis not present

## 2017-01-26 DIAGNOSIS — Z483 Aftercare following surgery for neoplasm: Secondary | ICD-10-CM | POA: Diagnosis not present

## 2017-01-26 DIAGNOSIS — M1712 Unilateral primary osteoarthritis, left knee: Secondary | ICD-10-CM | POA: Diagnosis not present

## 2017-01-29 DIAGNOSIS — M1612 Unilateral primary osteoarthritis, left hip: Secondary | ICD-10-CM | POA: Diagnosis not present

## 2017-01-29 DIAGNOSIS — M19022 Primary osteoarthritis, left elbow: Secondary | ICD-10-CM | POA: Diagnosis not present

## 2017-01-29 DIAGNOSIS — J9610 Chronic respiratory failure, unspecified whether with hypoxia or hypercapnia: Secondary | ICD-10-CM | POA: Diagnosis not present

## 2017-01-29 DIAGNOSIS — M1712 Unilateral primary osteoarthritis, left knee: Secondary | ICD-10-CM | POA: Diagnosis not present

## 2017-01-29 DIAGNOSIS — Z483 Aftercare following surgery for neoplasm: Secondary | ICD-10-CM | POA: Diagnosis not present

## 2017-01-29 DIAGNOSIS — M19012 Primary osteoarthritis, left shoulder: Secondary | ICD-10-CM | POA: Diagnosis not present

## 2017-01-29 DIAGNOSIS — N183 Chronic kidney disease, stage 3 (moderate): Secondary | ICD-10-CM | POA: Diagnosis not present

## 2017-01-29 DIAGNOSIS — I129 Hypertensive chronic kidney disease with stage 1 through stage 4 chronic kidney disease, or unspecified chronic kidney disease: Secondary | ICD-10-CM | POA: Diagnosis not present

## 2017-01-29 DIAGNOSIS — C7492 Malignant neoplasm of unspecified part of left adrenal gland: Secondary | ICD-10-CM | POA: Diagnosis not present

## 2017-01-29 DIAGNOSIS — M19042 Primary osteoarthritis, left hand: Secondary | ICD-10-CM | POA: Diagnosis not present

## 2017-02-02 DIAGNOSIS — Z483 Aftercare following surgery for neoplasm: Secondary | ICD-10-CM | POA: Diagnosis not present

## 2017-02-02 DIAGNOSIS — M19022 Primary osteoarthritis, left elbow: Secondary | ICD-10-CM | POA: Diagnosis not present

## 2017-02-02 DIAGNOSIS — I129 Hypertensive chronic kidney disease with stage 1 through stage 4 chronic kidney disease, or unspecified chronic kidney disease: Secondary | ICD-10-CM | POA: Diagnosis not present

## 2017-02-02 DIAGNOSIS — M19042 Primary osteoarthritis, left hand: Secondary | ICD-10-CM | POA: Diagnosis not present

## 2017-02-02 DIAGNOSIS — M19012 Primary osteoarthritis, left shoulder: Secondary | ICD-10-CM | POA: Diagnosis not present

## 2017-02-02 DIAGNOSIS — N183 Chronic kidney disease, stage 3 (moderate): Secondary | ICD-10-CM | POA: Diagnosis not present

## 2017-02-02 DIAGNOSIS — J9610 Chronic respiratory failure, unspecified whether with hypoxia or hypercapnia: Secondary | ICD-10-CM | POA: Diagnosis not present

## 2017-02-02 DIAGNOSIS — M1612 Unilateral primary osteoarthritis, left hip: Secondary | ICD-10-CM | POA: Diagnosis not present

## 2017-02-02 DIAGNOSIS — C7492 Malignant neoplasm of unspecified part of left adrenal gland: Secondary | ICD-10-CM | POA: Diagnosis not present

## 2017-02-02 DIAGNOSIS — M1712 Unilateral primary osteoarthritis, left knee: Secondary | ICD-10-CM | POA: Diagnosis not present

## 2017-02-07 DIAGNOSIS — M19012 Primary osteoarthritis, left shoulder: Secondary | ICD-10-CM | POA: Diagnosis not present

## 2017-02-07 DIAGNOSIS — M19022 Primary osteoarthritis, left elbow: Secondary | ICD-10-CM | POA: Diagnosis not present

## 2017-02-07 DIAGNOSIS — J9610 Chronic respiratory failure, unspecified whether with hypoxia or hypercapnia: Secondary | ICD-10-CM | POA: Diagnosis not present

## 2017-02-07 DIAGNOSIS — M1612 Unilateral primary osteoarthritis, left hip: Secondary | ICD-10-CM | POA: Diagnosis not present

## 2017-02-07 DIAGNOSIS — I129 Hypertensive chronic kidney disease with stage 1 through stage 4 chronic kidney disease, or unspecified chronic kidney disease: Secondary | ICD-10-CM | POA: Diagnosis not present

## 2017-02-07 DIAGNOSIS — C7492 Malignant neoplasm of unspecified part of left adrenal gland: Secondary | ICD-10-CM | POA: Diagnosis not present

## 2017-02-07 DIAGNOSIS — Z483 Aftercare following surgery for neoplasm: Secondary | ICD-10-CM | POA: Diagnosis not present

## 2017-02-07 DIAGNOSIS — M1712 Unilateral primary osteoarthritis, left knee: Secondary | ICD-10-CM | POA: Diagnosis not present

## 2017-02-07 DIAGNOSIS — M19042 Primary osteoarthritis, left hand: Secondary | ICD-10-CM | POA: Diagnosis not present

## 2017-02-07 DIAGNOSIS — N183 Chronic kidney disease, stage 3 (moderate): Secondary | ICD-10-CM | POA: Diagnosis not present

## 2017-02-08 DIAGNOSIS — M19042 Primary osteoarthritis, left hand: Secondary | ICD-10-CM | POA: Diagnosis not present

## 2017-02-08 DIAGNOSIS — J9610 Chronic respiratory failure, unspecified whether with hypoxia or hypercapnia: Secondary | ICD-10-CM | POA: Diagnosis not present

## 2017-02-08 DIAGNOSIS — M1712 Unilateral primary osteoarthritis, left knee: Secondary | ICD-10-CM | POA: Diagnosis not present

## 2017-02-08 DIAGNOSIS — C7492 Malignant neoplasm of unspecified part of left adrenal gland: Secondary | ICD-10-CM | POA: Diagnosis not present

## 2017-02-08 DIAGNOSIS — M19012 Primary osteoarthritis, left shoulder: Secondary | ICD-10-CM | POA: Diagnosis not present

## 2017-02-08 DIAGNOSIS — M19022 Primary osteoarthritis, left elbow: Secondary | ICD-10-CM | POA: Diagnosis not present

## 2017-02-08 DIAGNOSIS — Z483 Aftercare following surgery for neoplasm: Secondary | ICD-10-CM | POA: Diagnosis not present

## 2017-02-08 DIAGNOSIS — I129 Hypertensive chronic kidney disease with stage 1 through stage 4 chronic kidney disease, or unspecified chronic kidney disease: Secondary | ICD-10-CM | POA: Diagnosis not present

## 2017-02-08 DIAGNOSIS — N183 Chronic kidney disease, stage 3 (moderate): Secondary | ICD-10-CM | POA: Diagnosis not present

## 2017-02-08 DIAGNOSIS — M1612 Unilateral primary osteoarthritis, left hip: Secondary | ICD-10-CM | POA: Diagnosis not present

## 2017-02-12 DIAGNOSIS — M1712 Unilateral primary osteoarthritis, left knee: Secondary | ICD-10-CM | POA: Diagnosis not present

## 2017-02-12 DIAGNOSIS — M19012 Primary osteoarthritis, left shoulder: Secondary | ICD-10-CM | POA: Diagnosis not present

## 2017-02-12 DIAGNOSIS — M1612 Unilateral primary osteoarthritis, left hip: Secondary | ICD-10-CM | POA: Diagnosis not present

## 2017-02-12 DIAGNOSIS — M19022 Primary osteoarthritis, left elbow: Secondary | ICD-10-CM | POA: Diagnosis not present

## 2017-02-12 DIAGNOSIS — M19042 Primary osteoarthritis, left hand: Secondary | ICD-10-CM | POA: Diagnosis not present

## 2017-02-12 DIAGNOSIS — N183 Chronic kidney disease, stage 3 (moderate): Secondary | ICD-10-CM | POA: Diagnosis not present

## 2017-02-12 DIAGNOSIS — I129 Hypertensive chronic kidney disease with stage 1 through stage 4 chronic kidney disease, or unspecified chronic kidney disease: Secondary | ICD-10-CM | POA: Diagnosis not present

## 2017-02-12 DIAGNOSIS — C7492 Malignant neoplasm of unspecified part of left adrenal gland: Secondary | ICD-10-CM | POA: Diagnosis not present

## 2017-02-12 DIAGNOSIS — Z483 Aftercare following surgery for neoplasm: Secondary | ICD-10-CM | POA: Diagnosis not present

## 2017-02-12 DIAGNOSIS — J9610 Chronic respiratory failure, unspecified whether with hypoxia or hypercapnia: Secondary | ICD-10-CM | POA: Diagnosis not present

## 2017-02-16 DIAGNOSIS — Z483 Aftercare following surgery for neoplasm: Secondary | ICD-10-CM | POA: Diagnosis not present

## 2017-02-16 DIAGNOSIS — J9610 Chronic respiratory failure, unspecified whether with hypoxia or hypercapnia: Secondary | ICD-10-CM | POA: Diagnosis not present

## 2017-02-16 DIAGNOSIS — M19012 Primary osteoarthritis, left shoulder: Secondary | ICD-10-CM | POA: Diagnosis not present

## 2017-02-16 DIAGNOSIS — I129 Hypertensive chronic kidney disease with stage 1 through stage 4 chronic kidney disease, or unspecified chronic kidney disease: Secondary | ICD-10-CM | POA: Diagnosis not present

## 2017-02-16 DIAGNOSIS — C7492 Malignant neoplasm of unspecified part of left adrenal gland: Secondary | ICD-10-CM | POA: Diagnosis not present

## 2017-02-16 DIAGNOSIS — M1712 Unilateral primary osteoarthritis, left knee: Secondary | ICD-10-CM | POA: Diagnosis not present

## 2017-02-16 DIAGNOSIS — N183 Chronic kidney disease, stage 3 (moderate): Secondary | ICD-10-CM | POA: Diagnosis not present

## 2017-02-16 DIAGNOSIS — M1612 Unilateral primary osteoarthritis, left hip: Secondary | ICD-10-CM | POA: Diagnosis not present

## 2017-02-16 DIAGNOSIS — M19022 Primary osteoarthritis, left elbow: Secondary | ICD-10-CM | POA: Diagnosis not present

## 2017-02-16 DIAGNOSIS — M19042 Primary osteoarthritis, left hand: Secondary | ICD-10-CM | POA: Diagnosis not present

## 2017-02-19 DIAGNOSIS — Z483 Aftercare following surgery for neoplasm: Secondary | ICD-10-CM | POA: Diagnosis not present

## 2017-02-19 DIAGNOSIS — N183 Chronic kidney disease, stage 3 (moderate): Secondary | ICD-10-CM | POA: Diagnosis not present

## 2017-02-19 DIAGNOSIS — I129 Hypertensive chronic kidney disease with stage 1 through stage 4 chronic kidney disease, or unspecified chronic kidney disease: Secondary | ICD-10-CM | POA: Diagnosis not present

## 2017-02-19 DIAGNOSIS — M1712 Unilateral primary osteoarthritis, left knee: Secondary | ICD-10-CM | POA: Diagnosis not present

## 2017-02-19 DIAGNOSIS — M19042 Primary osteoarthritis, left hand: Secondary | ICD-10-CM | POA: Diagnosis not present

## 2017-02-19 DIAGNOSIS — M1612 Unilateral primary osteoarthritis, left hip: Secondary | ICD-10-CM | POA: Diagnosis not present

## 2017-02-19 DIAGNOSIS — M19012 Primary osteoarthritis, left shoulder: Secondary | ICD-10-CM | POA: Diagnosis not present

## 2017-02-19 DIAGNOSIS — J9610 Chronic respiratory failure, unspecified whether with hypoxia or hypercapnia: Secondary | ICD-10-CM | POA: Diagnosis not present

## 2017-02-19 DIAGNOSIS — M19022 Primary osteoarthritis, left elbow: Secondary | ICD-10-CM | POA: Diagnosis not present

## 2017-02-19 DIAGNOSIS — C7492 Malignant neoplasm of unspecified part of left adrenal gland: Secondary | ICD-10-CM | POA: Diagnosis not present

## 2017-02-21 DIAGNOSIS — C7492 Malignant neoplasm of unspecified part of left adrenal gland: Secondary | ICD-10-CM | POA: Diagnosis not present

## 2017-02-21 DIAGNOSIS — J9610 Chronic respiratory failure, unspecified whether with hypoxia or hypercapnia: Secondary | ICD-10-CM | POA: Diagnosis not present

## 2017-02-21 DIAGNOSIS — Z483 Aftercare following surgery for neoplasm: Secondary | ICD-10-CM | POA: Diagnosis not present

## 2017-02-21 DIAGNOSIS — I129 Hypertensive chronic kidney disease with stage 1 through stage 4 chronic kidney disease, or unspecified chronic kidney disease: Secondary | ICD-10-CM | POA: Diagnosis not present

## 2017-02-21 DIAGNOSIS — M19012 Primary osteoarthritis, left shoulder: Secondary | ICD-10-CM | POA: Diagnosis not present

## 2017-02-21 DIAGNOSIS — M19042 Primary osteoarthritis, left hand: Secondary | ICD-10-CM | POA: Diagnosis not present

## 2017-02-21 DIAGNOSIS — M1712 Unilateral primary osteoarthritis, left knee: Secondary | ICD-10-CM | POA: Diagnosis not present

## 2017-02-21 DIAGNOSIS — M1612 Unilateral primary osteoarthritis, left hip: Secondary | ICD-10-CM | POA: Diagnosis not present

## 2017-02-21 DIAGNOSIS — M19022 Primary osteoarthritis, left elbow: Secondary | ICD-10-CM | POA: Diagnosis not present

## 2017-02-21 DIAGNOSIS — N183 Chronic kidney disease, stage 3 (moderate): Secondary | ICD-10-CM | POA: Diagnosis not present

## 2017-02-26 DIAGNOSIS — M19022 Primary osteoarthritis, left elbow: Secondary | ICD-10-CM | POA: Diagnosis not present

## 2017-02-26 DIAGNOSIS — M1712 Unilateral primary osteoarthritis, left knee: Secondary | ICD-10-CM | POA: Diagnosis not present

## 2017-02-26 DIAGNOSIS — J9610 Chronic respiratory failure, unspecified whether with hypoxia or hypercapnia: Secondary | ICD-10-CM | POA: Diagnosis not present

## 2017-02-26 DIAGNOSIS — M19012 Primary osteoarthritis, left shoulder: Secondary | ICD-10-CM | POA: Diagnosis not present

## 2017-02-26 DIAGNOSIS — M19042 Primary osteoarthritis, left hand: Secondary | ICD-10-CM | POA: Diagnosis not present

## 2017-02-26 DIAGNOSIS — I129 Hypertensive chronic kidney disease with stage 1 through stage 4 chronic kidney disease, or unspecified chronic kidney disease: Secondary | ICD-10-CM | POA: Diagnosis not present

## 2017-02-26 DIAGNOSIS — Z483 Aftercare following surgery for neoplasm: Secondary | ICD-10-CM | POA: Diagnosis not present

## 2017-02-26 DIAGNOSIS — N183 Chronic kidney disease, stage 3 (moderate): Secondary | ICD-10-CM | POA: Diagnosis not present

## 2017-02-26 DIAGNOSIS — M1612 Unilateral primary osteoarthritis, left hip: Secondary | ICD-10-CM | POA: Diagnosis not present

## 2017-02-26 DIAGNOSIS — C7492 Malignant neoplasm of unspecified part of left adrenal gland: Secondary | ICD-10-CM | POA: Diagnosis not present

## 2017-03-01 DIAGNOSIS — I129 Hypertensive chronic kidney disease with stage 1 through stage 4 chronic kidney disease, or unspecified chronic kidney disease: Secondary | ICD-10-CM | POA: Diagnosis not present

## 2017-03-01 DIAGNOSIS — N183 Chronic kidney disease, stage 3 (moderate): Secondary | ICD-10-CM | POA: Diagnosis not present

## 2017-03-01 DIAGNOSIS — Z483 Aftercare following surgery for neoplasm: Secondary | ICD-10-CM | POA: Diagnosis not present

## 2017-03-02 DIAGNOSIS — M19022 Primary osteoarthritis, left elbow: Secondary | ICD-10-CM | POA: Diagnosis not present

## 2017-03-02 DIAGNOSIS — M19042 Primary osteoarthritis, left hand: Secondary | ICD-10-CM | POA: Diagnosis not present

## 2017-03-02 DIAGNOSIS — C7492 Malignant neoplasm of unspecified part of left adrenal gland: Secondary | ICD-10-CM | POA: Diagnosis not present

## 2017-03-02 DIAGNOSIS — M1612 Unilateral primary osteoarthritis, left hip: Secondary | ICD-10-CM | POA: Diagnosis not present

## 2017-03-02 DIAGNOSIS — M19012 Primary osteoarthritis, left shoulder: Secondary | ICD-10-CM | POA: Diagnosis not present

## 2017-03-02 DIAGNOSIS — N183 Chronic kidney disease, stage 3 (moderate): Secondary | ICD-10-CM | POA: Diagnosis not present

## 2017-03-02 DIAGNOSIS — J9610 Chronic respiratory failure, unspecified whether with hypoxia or hypercapnia: Secondary | ICD-10-CM | POA: Diagnosis not present

## 2017-03-02 DIAGNOSIS — Z483 Aftercare following surgery for neoplasm: Secondary | ICD-10-CM | POA: Diagnosis not present

## 2017-03-02 DIAGNOSIS — I129 Hypertensive chronic kidney disease with stage 1 through stage 4 chronic kidney disease, or unspecified chronic kidney disease: Secondary | ICD-10-CM | POA: Diagnosis not present

## 2017-03-02 DIAGNOSIS — M1712 Unilateral primary osteoarthritis, left knee: Secondary | ICD-10-CM | POA: Diagnosis not present

## 2017-03-05 DIAGNOSIS — M1712 Unilateral primary osteoarthritis, left knee: Secondary | ICD-10-CM | POA: Diagnosis not present

## 2017-03-05 DIAGNOSIS — C7492 Malignant neoplasm of unspecified part of left adrenal gland: Secondary | ICD-10-CM | POA: Diagnosis not present

## 2017-03-05 DIAGNOSIS — J9610 Chronic respiratory failure, unspecified whether with hypoxia or hypercapnia: Secondary | ICD-10-CM | POA: Diagnosis not present

## 2017-03-05 DIAGNOSIS — Z483 Aftercare following surgery for neoplasm: Secondary | ICD-10-CM | POA: Diagnosis not present

## 2017-03-05 DIAGNOSIS — I129 Hypertensive chronic kidney disease with stage 1 through stage 4 chronic kidney disease, or unspecified chronic kidney disease: Secondary | ICD-10-CM | POA: Diagnosis not present

## 2017-03-05 DIAGNOSIS — M19042 Primary osteoarthritis, left hand: Secondary | ICD-10-CM | POA: Diagnosis not present

## 2017-03-05 DIAGNOSIS — M19012 Primary osteoarthritis, left shoulder: Secondary | ICD-10-CM | POA: Diagnosis not present

## 2017-03-05 DIAGNOSIS — M1612 Unilateral primary osteoarthritis, left hip: Secondary | ICD-10-CM | POA: Diagnosis not present

## 2017-03-05 DIAGNOSIS — N183 Chronic kidney disease, stage 3 (moderate): Secondary | ICD-10-CM | POA: Diagnosis not present

## 2017-03-05 DIAGNOSIS — M19022 Primary osteoarthritis, left elbow: Secondary | ICD-10-CM | POA: Diagnosis not present

## 2017-03-07 DIAGNOSIS — I129 Hypertensive chronic kidney disease with stage 1 through stage 4 chronic kidney disease, or unspecified chronic kidney disease: Secondary | ICD-10-CM | POA: Diagnosis not present

## 2017-03-07 DIAGNOSIS — J9610 Chronic respiratory failure, unspecified whether with hypoxia or hypercapnia: Secondary | ICD-10-CM | POA: Diagnosis not present

## 2017-03-07 DIAGNOSIS — C7492 Malignant neoplasm of unspecified part of left adrenal gland: Secondary | ICD-10-CM | POA: Diagnosis not present

## 2017-03-07 DIAGNOSIS — M19022 Primary osteoarthritis, left elbow: Secondary | ICD-10-CM | POA: Diagnosis not present

## 2017-03-07 DIAGNOSIS — N183 Chronic kidney disease, stage 3 (moderate): Secondary | ICD-10-CM | POA: Diagnosis not present

## 2017-03-07 DIAGNOSIS — M1612 Unilateral primary osteoarthritis, left hip: Secondary | ICD-10-CM | POA: Diagnosis not present

## 2017-03-07 DIAGNOSIS — Z483 Aftercare following surgery for neoplasm: Secondary | ICD-10-CM | POA: Diagnosis not present

## 2017-03-07 DIAGNOSIS — M1712 Unilateral primary osteoarthritis, left knee: Secondary | ICD-10-CM | POA: Diagnosis not present

## 2017-03-07 DIAGNOSIS — M19042 Primary osteoarthritis, left hand: Secondary | ICD-10-CM | POA: Diagnosis not present

## 2017-03-07 DIAGNOSIS — M19012 Primary osteoarthritis, left shoulder: Secondary | ICD-10-CM | POA: Diagnosis not present

## 2017-03-12 DIAGNOSIS — M19012 Primary osteoarthritis, left shoulder: Secondary | ICD-10-CM | POA: Diagnosis not present

## 2017-03-12 DIAGNOSIS — M19022 Primary osteoarthritis, left elbow: Secondary | ICD-10-CM | POA: Diagnosis not present

## 2017-03-12 DIAGNOSIS — M1712 Unilateral primary osteoarthritis, left knee: Secondary | ICD-10-CM | POA: Diagnosis not present

## 2017-03-12 DIAGNOSIS — J9610 Chronic respiratory failure, unspecified whether with hypoxia or hypercapnia: Secondary | ICD-10-CM | POA: Diagnosis not present

## 2017-03-12 DIAGNOSIS — I129 Hypertensive chronic kidney disease with stage 1 through stage 4 chronic kidney disease, or unspecified chronic kidney disease: Secondary | ICD-10-CM | POA: Diagnosis not present

## 2017-03-12 DIAGNOSIS — N183 Chronic kidney disease, stage 3 (moderate): Secondary | ICD-10-CM | POA: Diagnosis not present

## 2017-03-12 DIAGNOSIS — Z483 Aftercare following surgery for neoplasm: Secondary | ICD-10-CM | POA: Diagnosis not present

## 2017-03-12 DIAGNOSIS — M19042 Primary osteoarthritis, left hand: Secondary | ICD-10-CM | POA: Diagnosis not present

## 2017-03-12 DIAGNOSIS — M1612 Unilateral primary osteoarthritis, left hip: Secondary | ICD-10-CM | POA: Diagnosis not present

## 2017-03-12 DIAGNOSIS — C7492 Malignant neoplasm of unspecified part of left adrenal gland: Secondary | ICD-10-CM | POA: Diagnosis not present

## 2017-03-14 DIAGNOSIS — M1612 Unilateral primary osteoarthritis, left hip: Secondary | ICD-10-CM | POA: Diagnosis not present

## 2017-03-14 DIAGNOSIS — I129 Hypertensive chronic kidney disease with stage 1 through stage 4 chronic kidney disease, or unspecified chronic kidney disease: Secondary | ICD-10-CM | POA: Diagnosis not present

## 2017-03-14 DIAGNOSIS — Z483 Aftercare following surgery for neoplasm: Secondary | ICD-10-CM | POA: Diagnosis not present

## 2017-03-14 DIAGNOSIS — J9610 Chronic respiratory failure, unspecified whether with hypoxia or hypercapnia: Secondary | ICD-10-CM | POA: Diagnosis not present

## 2017-03-14 DIAGNOSIS — M19022 Primary osteoarthritis, left elbow: Secondary | ICD-10-CM | POA: Diagnosis not present

## 2017-03-14 DIAGNOSIS — M19042 Primary osteoarthritis, left hand: Secondary | ICD-10-CM | POA: Diagnosis not present

## 2017-03-14 DIAGNOSIS — C7492 Malignant neoplasm of unspecified part of left adrenal gland: Secondary | ICD-10-CM | POA: Diagnosis not present

## 2017-03-14 DIAGNOSIS — N183 Chronic kidney disease, stage 3 (moderate): Secondary | ICD-10-CM | POA: Diagnosis not present

## 2017-03-14 DIAGNOSIS — M1712 Unilateral primary osteoarthritis, left knee: Secondary | ICD-10-CM | POA: Diagnosis not present

## 2017-03-14 DIAGNOSIS — M19012 Primary osteoarthritis, left shoulder: Secondary | ICD-10-CM | POA: Diagnosis not present

## 2017-03-20 DIAGNOSIS — M19022 Primary osteoarthritis, left elbow: Secondary | ICD-10-CM | POA: Diagnosis not present

## 2017-03-20 DIAGNOSIS — M1712 Unilateral primary osteoarthritis, left knee: Secondary | ICD-10-CM | POA: Diagnosis not present

## 2017-03-20 DIAGNOSIS — C7492 Malignant neoplasm of unspecified part of left adrenal gland: Secondary | ICD-10-CM | POA: Diagnosis not present

## 2017-03-20 DIAGNOSIS — I129 Hypertensive chronic kidney disease with stage 1 through stage 4 chronic kidney disease, or unspecified chronic kidney disease: Secondary | ICD-10-CM | POA: Diagnosis not present

## 2017-03-20 DIAGNOSIS — N183 Chronic kidney disease, stage 3 (moderate): Secondary | ICD-10-CM | POA: Diagnosis not present

## 2017-03-20 DIAGNOSIS — M19042 Primary osteoarthritis, left hand: Secondary | ICD-10-CM | POA: Diagnosis not present

## 2017-03-20 DIAGNOSIS — Z483 Aftercare following surgery for neoplasm: Secondary | ICD-10-CM | POA: Diagnosis not present

## 2017-03-20 DIAGNOSIS — J9610 Chronic respiratory failure, unspecified whether with hypoxia or hypercapnia: Secondary | ICD-10-CM | POA: Diagnosis not present

## 2017-03-20 DIAGNOSIS — M19012 Primary osteoarthritis, left shoulder: Secondary | ICD-10-CM | POA: Diagnosis not present

## 2017-03-20 DIAGNOSIS — M1612 Unilateral primary osteoarthritis, left hip: Secondary | ICD-10-CM | POA: Diagnosis not present

## 2017-03-22 DIAGNOSIS — I129 Hypertensive chronic kidney disease with stage 1 through stage 4 chronic kidney disease, or unspecified chronic kidney disease: Secondary | ICD-10-CM | POA: Diagnosis not present

## 2017-03-22 DIAGNOSIS — J9610 Chronic respiratory failure, unspecified whether with hypoxia or hypercapnia: Secondary | ICD-10-CM | POA: Diagnosis not present

## 2017-03-22 DIAGNOSIS — M19012 Primary osteoarthritis, left shoulder: Secondary | ICD-10-CM | POA: Diagnosis not present

## 2017-03-22 DIAGNOSIS — N183 Chronic kidney disease, stage 3 (moderate): Secondary | ICD-10-CM | POA: Diagnosis not present

## 2017-03-22 DIAGNOSIS — M1612 Unilateral primary osteoarthritis, left hip: Secondary | ICD-10-CM | POA: Diagnosis not present

## 2017-03-22 DIAGNOSIS — C7492 Malignant neoplasm of unspecified part of left adrenal gland: Secondary | ICD-10-CM | POA: Diagnosis not present

## 2017-03-22 DIAGNOSIS — M1712 Unilateral primary osteoarthritis, left knee: Secondary | ICD-10-CM | POA: Diagnosis not present

## 2017-03-22 DIAGNOSIS — M19042 Primary osteoarthritis, left hand: Secondary | ICD-10-CM | POA: Diagnosis not present

## 2017-03-22 DIAGNOSIS — Z483 Aftercare following surgery for neoplasm: Secondary | ICD-10-CM | POA: Diagnosis not present

## 2017-03-22 DIAGNOSIS — M19022 Primary osteoarthritis, left elbow: Secondary | ICD-10-CM | POA: Diagnosis not present

## 2017-03-26 DIAGNOSIS — M19042 Primary osteoarthritis, left hand: Secondary | ICD-10-CM | POA: Diagnosis not present

## 2017-03-26 DIAGNOSIS — M19022 Primary osteoarthritis, left elbow: Secondary | ICD-10-CM | POA: Diagnosis not present

## 2017-03-26 DIAGNOSIS — Z483 Aftercare following surgery for neoplasm: Secondary | ICD-10-CM | POA: Diagnosis not present

## 2017-03-26 DIAGNOSIS — M1612 Unilateral primary osteoarthritis, left hip: Secondary | ICD-10-CM | POA: Diagnosis not present

## 2017-03-26 DIAGNOSIS — N183 Chronic kidney disease, stage 3 (moderate): Secondary | ICD-10-CM | POA: Diagnosis not present

## 2017-03-26 DIAGNOSIS — M1712 Unilateral primary osteoarthritis, left knee: Secondary | ICD-10-CM | POA: Diagnosis not present

## 2017-03-26 DIAGNOSIS — M19012 Primary osteoarthritis, left shoulder: Secondary | ICD-10-CM | POA: Diagnosis not present

## 2017-03-26 DIAGNOSIS — I129 Hypertensive chronic kidney disease with stage 1 through stage 4 chronic kidney disease, or unspecified chronic kidney disease: Secondary | ICD-10-CM | POA: Diagnosis not present

## 2017-03-26 DIAGNOSIS — J9610 Chronic respiratory failure, unspecified whether with hypoxia or hypercapnia: Secondary | ICD-10-CM | POA: Diagnosis not present

## 2017-03-26 DIAGNOSIS — C7492 Malignant neoplasm of unspecified part of left adrenal gland: Secondary | ICD-10-CM | POA: Diagnosis not present

## 2017-03-29 DIAGNOSIS — Z483 Aftercare following surgery for neoplasm: Secondary | ICD-10-CM | POA: Diagnosis not present

## 2017-03-29 DIAGNOSIS — M1612 Unilateral primary osteoarthritis, left hip: Secondary | ICD-10-CM | POA: Diagnosis not present

## 2017-03-29 DIAGNOSIS — M19022 Primary osteoarthritis, left elbow: Secondary | ICD-10-CM | POA: Diagnosis not present

## 2017-03-29 DIAGNOSIS — M1712 Unilateral primary osteoarthritis, left knee: Secondary | ICD-10-CM | POA: Diagnosis not present

## 2017-03-29 DIAGNOSIS — N183 Chronic kidney disease, stage 3 (moderate): Secondary | ICD-10-CM | POA: Diagnosis not present

## 2017-03-29 DIAGNOSIS — C7492 Malignant neoplasm of unspecified part of left adrenal gland: Secondary | ICD-10-CM | POA: Diagnosis not present

## 2017-03-29 DIAGNOSIS — M19012 Primary osteoarthritis, left shoulder: Secondary | ICD-10-CM | POA: Diagnosis not present

## 2017-03-29 DIAGNOSIS — I129 Hypertensive chronic kidney disease with stage 1 through stage 4 chronic kidney disease, or unspecified chronic kidney disease: Secondary | ICD-10-CM | POA: Diagnosis not present

## 2017-03-29 DIAGNOSIS — J9610 Chronic respiratory failure, unspecified whether with hypoxia or hypercapnia: Secondary | ICD-10-CM | POA: Diagnosis not present

## 2017-03-29 DIAGNOSIS — M19042 Primary osteoarthritis, left hand: Secondary | ICD-10-CM | POA: Diagnosis not present

## 2017-04-02 DIAGNOSIS — C7492 Malignant neoplasm of unspecified part of left adrenal gland: Secondary | ICD-10-CM | POA: Diagnosis not present

## 2017-04-02 DIAGNOSIS — M19022 Primary osteoarthritis, left elbow: Secondary | ICD-10-CM | POA: Diagnosis not present

## 2017-04-02 DIAGNOSIS — I129 Hypertensive chronic kidney disease with stage 1 through stage 4 chronic kidney disease, or unspecified chronic kidney disease: Secondary | ICD-10-CM | POA: Diagnosis not present

## 2017-04-02 DIAGNOSIS — N183 Chronic kidney disease, stage 3 (moderate): Secondary | ICD-10-CM | POA: Diagnosis not present

## 2017-04-02 DIAGNOSIS — M19042 Primary osteoarthritis, left hand: Secondary | ICD-10-CM | POA: Diagnosis not present

## 2017-04-02 DIAGNOSIS — Z483 Aftercare following surgery for neoplasm: Secondary | ICD-10-CM | POA: Diagnosis not present

## 2017-04-02 DIAGNOSIS — M19012 Primary osteoarthritis, left shoulder: Secondary | ICD-10-CM | POA: Diagnosis not present

## 2017-04-02 DIAGNOSIS — M1612 Unilateral primary osteoarthritis, left hip: Secondary | ICD-10-CM | POA: Diagnosis not present

## 2017-04-02 DIAGNOSIS — J9610 Chronic respiratory failure, unspecified whether with hypoxia or hypercapnia: Secondary | ICD-10-CM | POA: Diagnosis not present

## 2017-04-02 DIAGNOSIS — M1712 Unilateral primary osteoarthritis, left knee: Secondary | ICD-10-CM | POA: Diagnosis not present

## 2017-04-03 DIAGNOSIS — C649 Malignant neoplasm of unspecified kidney, except renal pelvis: Secondary | ICD-10-CM | POA: Diagnosis not present

## 2017-04-03 DIAGNOSIS — Z905 Acquired absence of kidney: Secondary | ICD-10-CM | POA: Diagnosis not present

## 2017-04-03 DIAGNOSIS — C641 Malignant neoplasm of right kidney, except renal pelvis: Secondary | ICD-10-CM | POA: Diagnosis not present

## 2017-04-06 DIAGNOSIS — M19022 Primary osteoarthritis, left elbow: Secondary | ICD-10-CM | POA: Diagnosis not present

## 2017-04-06 DIAGNOSIS — Z483 Aftercare following surgery for neoplasm: Secondary | ICD-10-CM | POA: Diagnosis not present

## 2017-04-06 DIAGNOSIS — N183 Chronic kidney disease, stage 3 (moderate): Secondary | ICD-10-CM | POA: Diagnosis not present

## 2017-04-06 DIAGNOSIS — M1612 Unilateral primary osteoarthritis, left hip: Secondary | ICD-10-CM | POA: Diagnosis not present

## 2017-04-06 DIAGNOSIS — C7492 Malignant neoplasm of unspecified part of left adrenal gland: Secondary | ICD-10-CM | POA: Diagnosis not present

## 2017-04-06 DIAGNOSIS — M1712 Unilateral primary osteoarthritis, left knee: Secondary | ICD-10-CM | POA: Diagnosis not present

## 2017-04-06 DIAGNOSIS — M19042 Primary osteoarthritis, left hand: Secondary | ICD-10-CM | POA: Diagnosis not present

## 2017-04-06 DIAGNOSIS — M19012 Primary osteoarthritis, left shoulder: Secondary | ICD-10-CM | POA: Diagnosis not present

## 2017-04-06 DIAGNOSIS — I129 Hypertensive chronic kidney disease with stage 1 through stage 4 chronic kidney disease, or unspecified chronic kidney disease: Secondary | ICD-10-CM | POA: Diagnosis not present

## 2017-04-06 DIAGNOSIS — J9610 Chronic respiratory failure, unspecified whether with hypoxia or hypercapnia: Secondary | ICD-10-CM | POA: Diagnosis not present

## 2017-04-09 DIAGNOSIS — M19012 Primary osteoarthritis, left shoulder: Secondary | ICD-10-CM | POA: Diagnosis not present

## 2017-04-09 DIAGNOSIS — I129 Hypertensive chronic kidney disease with stage 1 through stage 4 chronic kidney disease, or unspecified chronic kidney disease: Secondary | ICD-10-CM | POA: Diagnosis not present

## 2017-04-09 DIAGNOSIS — M1612 Unilateral primary osteoarthritis, left hip: Secondary | ICD-10-CM | POA: Diagnosis not present

## 2017-04-09 DIAGNOSIS — Z483 Aftercare following surgery for neoplasm: Secondary | ICD-10-CM | POA: Diagnosis not present

## 2017-04-09 DIAGNOSIS — N183 Chronic kidney disease, stage 3 (moderate): Secondary | ICD-10-CM | POA: Diagnosis not present

## 2017-04-09 DIAGNOSIS — C7492 Malignant neoplasm of unspecified part of left adrenal gland: Secondary | ICD-10-CM | POA: Diagnosis not present

## 2017-04-09 DIAGNOSIS — M19022 Primary osteoarthritis, left elbow: Secondary | ICD-10-CM | POA: Diagnosis not present

## 2017-04-09 DIAGNOSIS — M19042 Primary osteoarthritis, left hand: Secondary | ICD-10-CM | POA: Diagnosis not present

## 2017-04-09 DIAGNOSIS — J9610 Chronic respiratory failure, unspecified whether with hypoxia or hypercapnia: Secondary | ICD-10-CM | POA: Diagnosis not present

## 2017-04-09 DIAGNOSIS — M1712 Unilateral primary osteoarthritis, left knee: Secondary | ICD-10-CM | POA: Diagnosis not present

## 2017-04-10 DIAGNOSIS — Z85528 Personal history of other malignant neoplasm of kidney: Secondary | ICD-10-CM | POA: Diagnosis not present

## 2017-04-10 DIAGNOSIS — C641 Malignant neoplasm of right kidney, except renal pelvis: Secondary | ICD-10-CM | POA: Diagnosis not present

## 2017-04-10 DIAGNOSIS — Z905 Acquired absence of kidney: Secondary | ICD-10-CM | POA: Diagnosis not present

## 2017-04-10 DIAGNOSIS — J9610 Chronic respiratory failure, unspecified whether with hypoxia or hypercapnia: Secondary | ICD-10-CM | POA: Diagnosis not present

## 2017-04-11 DIAGNOSIS — M1712 Unilateral primary osteoarthritis, left knee: Secondary | ICD-10-CM | POA: Diagnosis not present

## 2017-04-11 DIAGNOSIS — M19012 Primary osteoarthritis, left shoulder: Secondary | ICD-10-CM | POA: Diagnosis not present

## 2017-04-11 DIAGNOSIS — I129 Hypertensive chronic kidney disease with stage 1 through stage 4 chronic kidney disease, or unspecified chronic kidney disease: Secondary | ICD-10-CM | POA: Diagnosis not present

## 2017-04-11 DIAGNOSIS — M19042 Primary osteoarthritis, left hand: Secondary | ICD-10-CM | POA: Diagnosis not present

## 2017-04-11 DIAGNOSIS — M19022 Primary osteoarthritis, left elbow: Secondary | ICD-10-CM | POA: Diagnosis not present

## 2017-04-11 DIAGNOSIS — N183 Chronic kidney disease, stage 3 (moderate): Secondary | ICD-10-CM | POA: Diagnosis not present

## 2017-04-11 DIAGNOSIS — C7492 Malignant neoplasm of unspecified part of left adrenal gland: Secondary | ICD-10-CM | POA: Diagnosis not present

## 2017-04-11 DIAGNOSIS — J9610 Chronic respiratory failure, unspecified whether with hypoxia or hypercapnia: Secondary | ICD-10-CM | POA: Diagnosis not present

## 2017-04-11 DIAGNOSIS — M1612 Unilateral primary osteoarthritis, left hip: Secondary | ICD-10-CM | POA: Diagnosis not present

## 2017-04-11 DIAGNOSIS — Z483 Aftercare following surgery for neoplasm: Secondary | ICD-10-CM | POA: Diagnosis not present

## 2017-04-17 DIAGNOSIS — M19042 Primary osteoarthritis, left hand: Secondary | ICD-10-CM | POA: Diagnosis not present

## 2017-04-17 DIAGNOSIS — J9610 Chronic respiratory failure, unspecified whether with hypoxia or hypercapnia: Secondary | ICD-10-CM | POA: Diagnosis not present

## 2017-04-17 DIAGNOSIS — N183 Chronic kidney disease, stage 3 (moderate): Secondary | ICD-10-CM | POA: Diagnosis not present

## 2017-04-17 DIAGNOSIS — M1712 Unilateral primary osteoarthritis, left knee: Secondary | ICD-10-CM | POA: Diagnosis not present

## 2017-04-17 DIAGNOSIS — M19022 Primary osteoarthritis, left elbow: Secondary | ICD-10-CM | POA: Diagnosis not present

## 2017-04-17 DIAGNOSIS — M1612 Unilateral primary osteoarthritis, left hip: Secondary | ICD-10-CM | POA: Diagnosis not present

## 2017-04-17 DIAGNOSIS — Z483 Aftercare following surgery for neoplasm: Secondary | ICD-10-CM | POA: Diagnosis not present

## 2017-04-17 DIAGNOSIS — C7492 Malignant neoplasm of unspecified part of left adrenal gland: Secondary | ICD-10-CM | POA: Diagnosis not present

## 2017-04-17 DIAGNOSIS — I129 Hypertensive chronic kidney disease with stage 1 through stage 4 chronic kidney disease, or unspecified chronic kidney disease: Secondary | ICD-10-CM | POA: Diagnosis not present

## 2017-04-17 DIAGNOSIS — M19012 Primary osteoarthritis, left shoulder: Secondary | ICD-10-CM | POA: Diagnosis not present

## 2017-04-19 DIAGNOSIS — J9610 Chronic respiratory failure, unspecified whether with hypoxia or hypercapnia: Secondary | ICD-10-CM | POA: Diagnosis not present

## 2017-04-19 DIAGNOSIS — M1612 Unilateral primary osteoarthritis, left hip: Secondary | ICD-10-CM | POA: Diagnosis not present

## 2017-04-19 DIAGNOSIS — N183 Chronic kidney disease, stage 3 (moderate): Secondary | ICD-10-CM | POA: Diagnosis not present

## 2017-04-19 DIAGNOSIS — M1712 Unilateral primary osteoarthritis, left knee: Secondary | ICD-10-CM | POA: Diagnosis not present

## 2017-04-19 DIAGNOSIS — M19042 Primary osteoarthritis, left hand: Secondary | ICD-10-CM | POA: Diagnosis not present

## 2017-04-19 DIAGNOSIS — Z483 Aftercare following surgery for neoplasm: Secondary | ICD-10-CM | POA: Diagnosis not present

## 2017-04-19 DIAGNOSIS — M19012 Primary osteoarthritis, left shoulder: Secondary | ICD-10-CM | POA: Diagnosis not present

## 2017-04-19 DIAGNOSIS — M19022 Primary osteoarthritis, left elbow: Secondary | ICD-10-CM | POA: Diagnosis not present

## 2017-04-19 DIAGNOSIS — I129 Hypertensive chronic kidney disease with stage 1 through stage 4 chronic kidney disease, or unspecified chronic kidney disease: Secondary | ICD-10-CM | POA: Diagnosis not present

## 2017-04-19 DIAGNOSIS — C7492 Malignant neoplasm of unspecified part of left adrenal gland: Secondary | ICD-10-CM | POA: Diagnosis not present

## 2017-04-25 DIAGNOSIS — M1612 Unilateral primary osteoarthritis, left hip: Secondary | ICD-10-CM | POA: Diagnosis not present

## 2017-04-25 DIAGNOSIS — I129 Hypertensive chronic kidney disease with stage 1 through stage 4 chronic kidney disease, or unspecified chronic kidney disease: Secondary | ICD-10-CM | POA: Diagnosis not present

## 2017-04-25 DIAGNOSIS — C7492 Malignant neoplasm of unspecified part of left adrenal gland: Secondary | ICD-10-CM | POA: Diagnosis not present

## 2017-04-25 DIAGNOSIS — Z483 Aftercare following surgery for neoplasm: Secondary | ICD-10-CM | POA: Diagnosis not present

## 2017-04-25 DIAGNOSIS — J9610 Chronic respiratory failure, unspecified whether with hypoxia or hypercapnia: Secondary | ICD-10-CM | POA: Diagnosis not present

## 2017-04-25 DIAGNOSIS — N183 Chronic kidney disease, stage 3 (moderate): Secondary | ICD-10-CM | POA: Diagnosis not present

## 2017-04-25 DIAGNOSIS — M19012 Primary osteoarthritis, left shoulder: Secondary | ICD-10-CM | POA: Diagnosis not present

## 2017-04-25 DIAGNOSIS — M19042 Primary osteoarthritis, left hand: Secondary | ICD-10-CM | POA: Diagnosis not present

## 2017-04-25 DIAGNOSIS — M19022 Primary osteoarthritis, left elbow: Secondary | ICD-10-CM | POA: Diagnosis not present

## 2017-04-25 DIAGNOSIS — M1712 Unilateral primary osteoarthritis, left knee: Secondary | ICD-10-CM | POA: Diagnosis not present

## 2017-04-26 DIAGNOSIS — C7492 Malignant neoplasm of unspecified part of left adrenal gland: Secondary | ICD-10-CM | POA: Diagnosis not present

## 2017-04-26 DIAGNOSIS — J9610 Chronic respiratory failure, unspecified whether with hypoxia or hypercapnia: Secondary | ICD-10-CM | POA: Diagnosis not present

## 2017-04-26 DIAGNOSIS — Z483 Aftercare following surgery for neoplasm: Secondary | ICD-10-CM | POA: Diagnosis not present

## 2017-04-26 DIAGNOSIS — M19022 Primary osteoarthritis, left elbow: Secondary | ICD-10-CM | POA: Diagnosis not present

## 2017-04-26 DIAGNOSIS — M1612 Unilateral primary osteoarthritis, left hip: Secondary | ICD-10-CM | POA: Diagnosis not present

## 2017-04-26 DIAGNOSIS — M1712 Unilateral primary osteoarthritis, left knee: Secondary | ICD-10-CM | POA: Diagnosis not present

## 2017-04-26 DIAGNOSIS — N183 Chronic kidney disease, stage 3 (moderate): Secondary | ICD-10-CM | POA: Diagnosis not present

## 2017-04-26 DIAGNOSIS — M19012 Primary osteoarthritis, left shoulder: Secondary | ICD-10-CM | POA: Diagnosis not present

## 2017-04-26 DIAGNOSIS — M19042 Primary osteoarthritis, left hand: Secondary | ICD-10-CM | POA: Diagnosis not present

## 2017-04-26 DIAGNOSIS — I129 Hypertensive chronic kidney disease with stage 1 through stage 4 chronic kidney disease, or unspecified chronic kidney disease: Secondary | ICD-10-CM | POA: Diagnosis not present

## 2017-04-30 DIAGNOSIS — M1612 Unilateral primary osteoarthritis, left hip: Secondary | ICD-10-CM | POA: Diagnosis not present

## 2017-04-30 DIAGNOSIS — M19012 Primary osteoarthritis, left shoulder: Secondary | ICD-10-CM | POA: Diagnosis not present

## 2017-04-30 DIAGNOSIS — M1712 Unilateral primary osteoarthritis, left knee: Secondary | ICD-10-CM | POA: Diagnosis not present

## 2017-04-30 DIAGNOSIS — C7492 Malignant neoplasm of unspecified part of left adrenal gland: Secondary | ICD-10-CM | POA: Diagnosis not present

## 2017-04-30 DIAGNOSIS — M19042 Primary osteoarthritis, left hand: Secondary | ICD-10-CM | POA: Diagnosis not present

## 2017-04-30 DIAGNOSIS — Z483 Aftercare following surgery for neoplasm: Secondary | ICD-10-CM | POA: Diagnosis not present

## 2017-04-30 DIAGNOSIS — J9610 Chronic respiratory failure, unspecified whether with hypoxia or hypercapnia: Secondary | ICD-10-CM | POA: Diagnosis not present

## 2017-04-30 DIAGNOSIS — N183 Chronic kidney disease, stage 3 (moderate): Secondary | ICD-10-CM | POA: Diagnosis not present

## 2017-04-30 DIAGNOSIS — I129 Hypertensive chronic kidney disease with stage 1 through stage 4 chronic kidney disease, or unspecified chronic kidney disease: Secondary | ICD-10-CM | POA: Diagnosis not present

## 2017-04-30 DIAGNOSIS — M19022 Primary osteoarthritis, left elbow: Secondary | ICD-10-CM | POA: Diagnosis not present

## 2017-05-03 DIAGNOSIS — I129 Hypertensive chronic kidney disease with stage 1 through stage 4 chronic kidney disease, or unspecified chronic kidney disease: Secondary | ICD-10-CM | POA: Diagnosis not present

## 2017-05-03 DIAGNOSIS — N183 Chronic kidney disease, stage 3 (moderate): Secondary | ICD-10-CM | POA: Diagnosis not present

## 2017-05-03 DIAGNOSIS — C7492 Malignant neoplasm of unspecified part of left adrenal gland: Secondary | ICD-10-CM | POA: Diagnosis not present

## 2017-05-03 DIAGNOSIS — Z483 Aftercare following surgery for neoplasm: Secondary | ICD-10-CM | POA: Diagnosis not present

## 2017-05-03 DIAGNOSIS — M19042 Primary osteoarthritis, left hand: Secondary | ICD-10-CM | POA: Diagnosis not present

## 2017-05-03 DIAGNOSIS — M19022 Primary osteoarthritis, left elbow: Secondary | ICD-10-CM | POA: Diagnosis not present

## 2017-05-03 DIAGNOSIS — M1712 Unilateral primary osteoarthritis, left knee: Secondary | ICD-10-CM | POA: Diagnosis not present

## 2017-05-03 DIAGNOSIS — J9610 Chronic respiratory failure, unspecified whether with hypoxia or hypercapnia: Secondary | ICD-10-CM | POA: Diagnosis not present

## 2017-05-03 DIAGNOSIS — M19012 Primary osteoarthritis, left shoulder: Secondary | ICD-10-CM | POA: Diagnosis not present

## 2017-05-03 DIAGNOSIS — M1612 Unilateral primary osteoarthritis, left hip: Secondary | ICD-10-CM | POA: Diagnosis not present

## 2017-05-08 DIAGNOSIS — M19012 Primary osteoarthritis, left shoulder: Secondary | ICD-10-CM | POA: Diagnosis not present

## 2017-05-08 DIAGNOSIS — J9610 Chronic respiratory failure, unspecified whether with hypoxia or hypercapnia: Secondary | ICD-10-CM | POA: Diagnosis not present

## 2017-05-08 DIAGNOSIS — M19042 Primary osteoarthritis, left hand: Secondary | ICD-10-CM | POA: Diagnosis not present

## 2017-05-08 DIAGNOSIS — M1712 Unilateral primary osteoarthritis, left knee: Secondary | ICD-10-CM | POA: Diagnosis not present

## 2017-05-08 DIAGNOSIS — M19022 Primary osteoarthritis, left elbow: Secondary | ICD-10-CM | POA: Diagnosis not present

## 2017-05-08 DIAGNOSIS — C7492 Malignant neoplasm of unspecified part of left adrenal gland: Secondary | ICD-10-CM | POA: Diagnosis not present

## 2017-05-08 DIAGNOSIS — M1612 Unilateral primary osteoarthritis, left hip: Secondary | ICD-10-CM | POA: Diagnosis not present

## 2017-05-08 DIAGNOSIS — I129 Hypertensive chronic kidney disease with stage 1 through stage 4 chronic kidney disease, or unspecified chronic kidney disease: Secondary | ICD-10-CM | POA: Diagnosis not present

## 2017-05-08 DIAGNOSIS — N183 Chronic kidney disease, stage 3 (moderate): Secondary | ICD-10-CM | POA: Diagnosis not present

## 2017-05-08 DIAGNOSIS — Z483 Aftercare following surgery for neoplasm: Secondary | ICD-10-CM | POA: Diagnosis not present

## 2017-05-10 DIAGNOSIS — C7492 Malignant neoplasm of unspecified part of left adrenal gland: Secondary | ICD-10-CM | POA: Diagnosis not present

## 2017-05-10 DIAGNOSIS — I129 Hypertensive chronic kidney disease with stage 1 through stage 4 chronic kidney disease, or unspecified chronic kidney disease: Secondary | ICD-10-CM | POA: Diagnosis not present

## 2017-05-10 DIAGNOSIS — N183 Chronic kidney disease, stage 3 (moderate): Secondary | ICD-10-CM | POA: Diagnosis not present

## 2017-05-10 DIAGNOSIS — M19042 Primary osteoarthritis, left hand: Secondary | ICD-10-CM | POA: Diagnosis not present

## 2017-05-10 DIAGNOSIS — M19022 Primary osteoarthritis, left elbow: Secondary | ICD-10-CM | POA: Diagnosis not present

## 2017-05-10 DIAGNOSIS — M1612 Unilateral primary osteoarthritis, left hip: Secondary | ICD-10-CM | POA: Diagnosis not present

## 2017-05-10 DIAGNOSIS — M1712 Unilateral primary osteoarthritis, left knee: Secondary | ICD-10-CM | POA: Diagnosis not present

## 2017-05-10 DIAGNOSIS — J9610 Chronic respiratory failure, unspecified whether with hypoxia or hypercapnia: Secondary | ICD-10-CM | POA: Diagnosis not present

## 2017-05-10 DIAGNOSIS — M19012 Primary osteoarthritis, left shoulder: Secondary | ICD-10-CM | POA: Diagnosis not present

## 2017-05-10 DIAGNOSIS — Z483 Aftercare following surgery for neoplasm: Secondary | ICD-10-CM | POA: Diagnosis not present

## 2017-05-15 DIAGNOSIS — M1612 Unilateral primary osteoarthritis, left hip: Secondary | ICD-10-CM | POA: Diagnosis not present

## 2017-05-15 DIAGNOSIS — C7492 Malignant neoplasm of unspecified part of left adrenal gland: Secondary | ICD-10-CM | POA: Diagnosis not present

## 2017-05-15 DIAGNOSIS — M1712 Unilateral primary osteoarthritis, left knee: Secondary | ICD-10-CM | POA: Diagnosis not present

## 2017-05-15 DIAGNOSIS — N183 Chronic kidney disease, stage 3 (moderate): Secondary | ICD-10-CM | POA: Diagnosis not present

## 2017-05-15 DIAGNOSIS — J9610 Chronic respiratory failure, unspecified whether with hypoxia or hypercapnia: Secondary | ICD-10-CM | POA: Diagnosis not present

## 2017-05-15 DIAGNOSIS — M19022 Primary osteoarthritis, left elbow: Secondary | ICD-10-CM | POA: Diagnosis not present

## 2017-05-15 DIAGNOSIS — Z483 Aftercare following surgery for neoplasm: Secondary | ICD-10-CM | POA: Diagnosis not present

## 2017-05-15 DIAGNOSIS — I129 Hypertensive chronic kidney disease with stage 1 through stage 4 chronic kidney disease, or unspecified chronic kidney disease: Secondary | ICD-10-CM | POA: Diagnosis not present

## 2017-05-15 DIAGNOSIS — M19012 Primary osteoarthritis, left shoulder: Secondary | ICD-10-CM | POA: Diagnosis not present

## 2017-05-15 DIAGNOSIS — M19042 Primary osteoarthritis, left hand: Secondary | ICD-10-CM | POA: Diagnosis not present

## 2017-05-17 DIAGNOSIS — M19042 Primary osteoarthritis, left hand: Secondary | ICD-10-CM | POA: Diagnosis not present

## 2017-05-17 DIAGNOSIS — M19012 Primary osteoarthritis, left shoulder: Secondary | ICD-10-CM | POA: Diagnosis not present

## 2017-05-17 DIAGNOSIS — I129 Hypertensive chronic kidney disease with stage 1 through stage 4 chronic kidney disease, or unspecified chronic kidney disease: Secondary | ICD-10-CM | POA: Diagnosis not present

## 2017-05-17 DIAGNOSIS — N183 Chronic kidney disease, stage 3 (moderate): Secondary | ICD-10-CM | POA: Diagnosis not present

## 2017-05-17 DIAGNOSIS — M1612 Unilateral primary osteoarthritis, left hip: Secondary | ICD-10-CM | POA: Diagnosis not present

## 2017-05-17 DIAGNOSIS — C7492 Malignant neoplasm of unspecified part of left adrenal gland: Secondary | ICD-10-CM | POA: Diagnosis not present

## 2017-05-17 DIAGNOSIS — Z483 Aftercare following surgery for neoplasm: Secondary | ICD-10-CM | POA: Diagnosis not present

## 2017-05-17 DIAGNOSIS — J9610 Chronic respiratory failure, unspecified whether with hypoxia or hypercapnia: Secondary | ICD-10-CM | POA: Diagnosis not present

## 2017-05-17 DIAGNOSIS — M1712 Unilateral primary osteoarthritis, left knee: Secondary | ICD-10-CM | POA: Diagnosis not present

## 2017-05-17 DIAGNOSIS — M19022 Primary osteoarthritis, left elbow: Secondary | ICD-10-CM | POA: Diagnosis not present

## 2017-05-21 DIAGNOSIS — C7492 Malignant neoplasm of unspecified part of left adrenal gland: Secondary | ICD-10-CM | POA: Diagnosis not present

## 2017-05-21 DIAGNOSIS — J9610 Chronic respiratory failure, unspecified whether with hypoxia or hypercapnia: Secondary | ICD-10-CM | POA: Diagnosis not present

## 2017-05-21 DIAGNOSIS — M19012 Primary osteoarthritis, left shoulder: Secondary | ICD-10-CM | POA: Diagnosis not present

## 2017-05-21 DIAGNOSIS — M1612 Unilateral primary osteoarthritis, left hip: Secondary | ICD-10-CM | POA: Diagnosis not present

## 2017-05-21 DIAGNOSIS — N183 Chronic kidney disease, stage 3 (moderate): Secondary | ICD-10-CM | POA: Diagnosis not present

## 2017-05-21 DIAGNOSIS — M19022 Primary osteoarthritis, left elbow: Secondary | ICD-10-CM | POA: Diagnosis not present

## 2017-05-21 DIAGNOSIS — Z483 Aftercare following surgery for neoplasm: Secondary | ICD-10-CM | POA: Diagnosis not present

## 2017-05-21 DIAGNOSIS — M1712 Unilateral primary osteoarthritis, left knee: Secondary | ICD-10-CM | POA: Diagnosis not present

## 2017-05-21 DIAGNOSIS — I129 Hypertensive chronic kidney disease with stage 1 through stage 4 chronic kidney disease, or unspecified chronic kidney disease: Secondary | ICD-10-CM | POA: Diagnosis not present

## 2017-05-21 DIAGNOSIS — M19042 Primary osteoarthritis, left hand: Secondary | ICD-10-CM | POA: Diagnosis not present

## 2017-05-24 DIAGNOSIS — M19022 Primary osteoarthritis, left elbow: Secondary | ICD-10-CM | POA: Diagnosis not present

## 2017-05-24 DIAGNOSIS — M19042 Primary osteoarthritis, left hand: Secondary | ICD-10-CM | POA: Diagnosis not present

## 2017-05-24 DIAGNOSIS — N183 Chronic kidney disease, stage 3 (moderate): Secondary | ICD-10-CM | POA: Diagnosis not present

## 2017-05-24 DIAGNOSIS — C7492 Malignant neoplasm of unspecified part of left adrenal gland: Secondary | ICD-10-CM | POA: Diagnosis not present

## 2017-05-24 DIAGNOSIS — M1612 Unilateral primary osteoarthritis, left hip: Secondary | ICD-10-CM | POA: Diagnosis not present

## 2017-05-24 DIAGNOSIS — M1712 Unilateral primary osteoarthritis, left knee: Secondary | ICD-10-CM | POA: Diagnosis not present

## 2017-05-24 DIAGNOSIS — J9610 Chronic respiratory failure, unspecified whether with hypoxia or hypercapnia: Secondary | ICD-10-CM | POA: Diagnosis not present

## 2017-05-24 DIAGNOSIS — I129 Hypertensive chronic kidney disease with stage 1 through stage 4 chronic kidney disease, or unspecified chronic kidney disease: Secondary | ICD-10-CM | POA: Diagnosis not present

## 2017-05-24 DIAGNOSIS — Z483 Aftercare following surgery for neoplasm: Secondary | ICD-10-CM | POA: Diagnosis not present

## 2017-05-24 DIAGNOSIS — M19012 Primary osteoarthritis, left shoulder: Secondary | ICD-10-CM | POA: Diagnosis not present

## 2017-05-29 DIAGNOSIS — M1712 Unilateral primary osteoarthritis, left knee: Secondary | ICD-10-CM | POA: Diagnosis not present

## 2017-05-29 DIAGNOSIS — M19012 Primary osteoarthritis, left shoulder: Secondary | ICD-10-CM | POA: Diagnosis not present

## 2017-05-29 DIAGNOSIS — M19042 Primary osteoarthritis, left hand: Secondary | ICD-10-CM | POA: Diagnosis not present

## 2017-05-29 DIAGNOSIS — J9610 Chronic respiratory failure, unspecified whether with hypoxia or hypercapnia: Secondary | ICD-10-CM | POA: Diagnosis not present

## 2017-05-29 DIAGNOSIS — C7492 Malignant neoplasm of unspecified part of left adrenal gland: Secondary | ICD-10-CM | POA: Diagnosis not present

## 2017-05-29 DIAGNOSIS — N183 Chronic kidney disease, stage 3 (moderate): Secondary | ICD-10-CM | POA: Diagnosis not present

## 2017-05-29 DIAGNOSIS — I129 Hypertensive chronic kidney disease with stage 1 through stage 4 chronic kidney disease, or unspecified chronic kidney disease: Secondary | ICD-10-CM | POA: Diagnosis not present

## 2017-05-29 DIAGNOSIS — M19022 Primary osteoarthritis, left elbow: Secondary | ICD-10-CM | POA: Diagnosis not present

## 2017-05-29 DIAGNOSIS — Z483 Aftercare following surgery for neoplasm: Secondary | ICD-10-CM | POA: Diagnosis not present

## 2017-05-29 DIAGNOSIS — M1612 Unilateral primary osteoarthritis, left hip: Secondary | ICD-10-CM | POA: Diagnosis not present

## 2017-05-30 DIAGNOSIS — G4733 Obstructive sleep apnea (adult) (pediatric): Secondary | ICD-10-CM | POA: Diagnosis not present

## 2017-05-30 DIAGNOSIS — R7309 Other abnormal glucose: Secondary | ICD-10-CM | POA: Diagnosis not present

## 2017-05-30 DIAGNOSIS — E782 Mixed hyperlipidemia: Secondary | ICD-10-CM | POA: Diagnosis not present

## 2017-05-30 DIAGNOSIS — Z1389 Encounter for screening for other disorder: Secondary | ICD-10-CM | POA: Diagnosis not present

## 2017-05-30 DIAGNOSIS — R011 Cardiac murmur, unspecified: Secondary | ICD-10-CM | POA: Diagnosis not present

## 2017-05-31 DIAGNOSIS — M19042 Primary osteoarthritis, left hand: Secondary | ICD-10-CM | POA: Diagnosis not present

## 2017-05-31 DIAGNOSIS — C7492 Malignant neoplasm of unspecified part of left adrenal gland: Secondary | ICD-10-CM | POA: Diagnosis not present

## 2017-05-31 DIAGNOSIS — Z483 Aftercare following surgery for neoplasm: Secondary | ICD-10-CM | POA: Diagnosis not present

## 2017-05-31 DIAGNOSIS — M1712 Unilateral primary osteoarthritis, left knee: Secondary | ICD-10-CM | POA: Diagnosis not present

## 2017-05-31 DIAGNOSIS — M1612 Unilateral primary osteoarthritis, left hip: Secondary | ICD-10-CM | POA: Diagnosis not present

## 2017-05-31 DIAGNOSIS — M19012 Primary osteoarthritis, left shoulder: Secondary | ICD-10-CM | POA: Diagnosis not present

## 2017-05-31 DIAGNOSIS — M19022 Primary osteoarthritis, left elbow: Secondary | ICD-10-CM | POA: Diagnosis not present

## 2017-05-31 DIAGNOSIS — I129 Hypertensive chronic kidney disease with stage 1 through stage 4 chronic kidney disease, or unspecified chronic kidney disease: Secondary | ICD-10-CM | POA: Diagnosis not present

## 2017-05-31 DIAGNOSIS — J9610 Chronic respiratory failure, unspecified whether with hypoxia or hypercapnia: Secondary | ICD-10-CM | POA: Diagnosis not present

## 2017-05-31 DIAGNOSIS — N183 Chronic kidney disease, stage 3 (moderate): Secondary | ICD-10-CM | POA: Diagnosis not present

## 2017-06-04 DIAGNOSIS — I8001 Phlebitis and thrombophlebitis of superficial vessels of right lower extremity: Secondary | ICD-10-CM | POA: Diagnosis not present

## 2017-06-04 DIAGNOSIS — E875 Hyperkalemia: Secondary | ICD-10-CM | POA: Diagnosis not present

## 2017-06-04 DIAGNOSIS — Z85528 Personal history of other malignant neoplasm of kidney: Secondary | ICD-10-CM | POA: Diagnosis not present

## 2017-06-04 DIAGNOSIS — Z87891 Personal history of nicotine dependence: Secondary | ICD-10-CM | POA: Diagnosis not present

## 2017-06-04 DIAGNOSIS — I4891 Unspecified atrial fibrillation: Secondary | ICD-10-CM | POA: Diagnosis not present

## 2017-06-04 DIAGNOSIS — I809 Phlebitis and thrombophlebitis of unspecified site: Secondary | ICD-10-CM | POA: Diagnosis not present

## 2017-06-04 DIAGNOSIS — N179 Acute kidney failure, unspecified: Secondary | ICD-10-CM | POA: Diagnosis not present

## 2017-06-04 DIAGNOSIS — M1A9XX0 Chronic gout, unspecified, without tophus (tophi): Secondary | ICD-10-CM | POA: Diagnosis not present

## 2017-06-04 DIAGNOSIS — Z8674 Personal history of sudden cardiac arrest: Secondary | ICD-10-CM | POA: Diagnosis not present

## 2017-06-04 DIAGNOSIS — I462 Cardiac arrest due to underlying cardiac condition: Secondary | ICD-10-CM | POA: Diagnosis not present

## 2017-06-04 DIAGNOSIS — J9610 Chronic respiratory failure, unspecified whether with hypoxia or hypercapnia: Secondary | ICD-10-CM | POA: Diagnosis not present

## 2017-06-04 DIAGNOSIS — I4901 Ventricular fibrillation: Secondary | ICD-10-CM | POA: Diagnosis not present

## 2017-06-04 DIAGNOSIS — R531 Weakness: Secondary | ICD-10-CM | POA: Diagnosis not present

## 2017-06-04 DIAGNOSIS — R918 Other nonspecific abnormal finding of lung field: Secondary | ICD-10-CM | POA: Diagnosis not present

## 2017-06-04 DIAGNOSIS — I25119 Atherosclerotic heart disease of native coronary artery with unspecified angina pectoris: Secondary | ICD-10-CM | POA: Diagnosis not present

## 2017-06-04 DIAGNOSIS — I4892 Unspecified atrial flutter: Secondary | ICD-10-CM | POA: Diagnosis not present

## 2017-06-04 DIAGNOSIS — Z7982 Long term (current) use of aspirin: Secondary | ICD-10-CM | POA: Diagnosis not present

## 2017-06-04 DIAGNOSIS — Z9889 Other specified postprocedural states: Secondary | ICD-10-CM | POA: Diagnosis not present

## 2017-06-04 DIAGNOSIS — I214 Non-ST elevation (NSTEMI) myocardial infarction: Secondary | ICD-10-CM | POA: Diagnosis not present

## 2017-06-04 DIAGNOSIS — R079 Chest pain, unspecified: Secondary | ICD-10-CM | POA: Diagnosis not present

## 2017-06-04 DIAGNOSIS — Z8679 Personal history of other diseases of the circulatory system: Secondary | ICD-10-CM | POA: Diagnosis not present

## 2017-06-04 DIAGNOSIS — R072 Precordial pain: Secondary | ICD-10-CM | POA: Diagnosis not present

## 2017-06-04 DIAGNOSIS — I129 Hypertensive chronic kidney disease with stage 1 through stage 4 chronic kidney disease, or unspecified chronic kidney disease: Secondary | ICD-10-CM | POA: Diagnosis not present

## 2017-06-04 DIAGNOSIS — Z7401 Bed confinement status: Secondary | ICD-10-CM | POA: Diagnosis not present

## 2017-06-04 DIAGNOSIS — I469 Cardiac arrest, cause unspecified: Secondary | ICD-10-CM | POA: Diagnosis not present

## 2017-06-04 DIAGNOSIS — I120 Hypertensive chronic kidney disease with stage 5 chronic kidney disease or end stage renal disease: Secondary | ICD-10-CM | POA: Diagnosis not present

## 2017-06-04 DIAGNOSIS — R001 Bradycardia, unspecified: Secondary | ICD-10-CM | POA: Diagnosis not present

## 2017-06-04 DIAGNOSIS — R000 Tachycardia, unspecified: Secondary | ICD-10-CM | POA: Diagnosis not present

## 2017-06-04 DIAGNOSIS — I4510 Unspecified right bundle-branch block: Secondary | ICD-10-CM | POA: Diagnosis not present

## 2017-06-04 DIAGNOSIS — I472 Ventricular tachycardia: Secondary | ICD-10-CM | POA: Diagnosis not present

## 2017-06-04 DIAGNOSIS — I499 Cardiac arrhythmia, unspecified: Secondary | ICD-10-CM | POA: Diagnosis not present

## 2017-06-04 DIAGNOSIS — N184 Chronic kidney disease, stage 4 (severe): Secondary | ICD-10-CM | POA: Diagnosis not present

## 2017-06-04 DIAGNOSIS — R7989 Other specified abnormal findings of blood chemistry: Secondary | ICD-10-CM | POA: Diagnosis not present

## 2017-06-04 DIAGNOSIS — I2510 Atherosclerotic heart disease of native coronary artery without angina pectoris: Secondary | ICD-10-CM | POA: Diagnosis not present

## 2017-06-04 DIAGNOSIS — N183 Chronic kidney disease, stage 3 (moderate): Secondary | ICD-10-CM | POA: Diagnosis not present

## 2017-06-04 DIAGNOSIS — I442 Atrioventricular block, complete: Secondary | ICD-10-CM | POA: Diagnosis not present

## 2017-06-04 DIAGNOSIS — E878 Other disorders of electrolyte and fluid balance, not elsewhere classified: Secondary | ICD-10-CM | POA: Diagnosis not present

## 2017-06-04 DIAGNOSIS — G4733 Obstructive sleep apnea (adult) (pediatric): Secondary | ICD-10-CM | POA: Diagnosis not present

## 2017-06-15 DIAGNOSIS — M1612 Unilateral primary osteoarthritis, left hip: Secondary | ICD-10-CM | POA: Diagnosis not present

## 2017-06-15 DIAGNOSIS — J9610 Chronic respiratory failure, unspecified whether with hypoxia or hypercapnia: Secondary | ICD-10-CM | POA: Diagnosis not present

## 2017-06-15 DIAGNOSIS — N183 Chronic kidney disease, stage 3 (moderate): Secondary | ICD-10-CM | POA: Diagnosis not present

## 2017-06-15 DIAGNOSIS — Z483 Aftercare following surgery for neoplasm: Secondary | ICD-10-CM | POA: Diagnosis not present

## 2017-06-15 DIAGNOSIS — I129 Hypertensive chronic kidney disease with stage 1 through stage 4 chronic kidney disease, or unspecified chronic kidney disease: Secondary | ICD-10-CM | POA: Diagnosis not present

## 2017-06-15 DIAGNOSIS — M1712 Unilateral primary osteoarthritis, left knee: Secondary | ICD-10-CM | POA: Diagnosis not present

## 2017-06-15 DIAGNOSIS — M19012 Primary osteoarthritis, left shoulder: Secondary | ICD-10-CM | POA: Diagnosis not present

## 2017-06-15 DIAGNOSIS — M19022 Primary osteoarthritis, left elbow: Secondary | ICD-10-CM | POA: Diagnosis not present

## 2017-06-15 DIAGNOSIS — M19042 Primary osteoarthritis, left hand: Secondary | ICD-10-CM | POA: Diagnosis not present

## 2017-06-15 DIAGNOSIS — C7492 Malignant neoplasm of unspecified part of left adrenal gland: Secondary | ICD-10-CM | POA: Diagnosis not present

## 2017-06-18 DIAGNOSIS — I129 Hypertensive chronic kidney disease with stage 1 through stage 4 chronic kidney disease, or unspecified chronic kidney disease: Secondary | ICD-10-CM | POA: Diagnosis not present

## 2017-06-18 DIAGNOSIS — M1612 Unilateral primary osteoarthritis, left hip: Secondary | ICD-10-CM | POA: Diagnosis not present

## 2017-06-18 DIAGNOSIS — M19022 Primary osteoarthritis, left elbow: Secondary | ICD-10-CM | POA: Diagnosis not present

## 2017-06-18 DIAGNOSIS — M1712 Unilateral primary osteoarthritis, left knee: Secondary | ICD-10-CM | POA: Diagnosis not present

## 2017-06-18 DIAGNOSIS — M19012 Primary osteoarthritis, left shoulder: Secondary | ICD-10-CM | POA: Diagnosis not present

## 2017-06-18 DIAGNOSIS — C7492 Malignant neoplasm of unspecified part of left adrenal gland: Secondary | ICD-10-CM | POA: Diagnosis not present

## 2017-06-18 DIAGNOSIS — J9610 Chronic respiratory failure, unspecified whether with hypoxia or hypercapnia: Secondary | ICD-10-CM | POA: Diagnosis not present

## 2017-06-18 DIAGNOSIS — N183 Chronic kidney disease, stage 3 (moderate): Secondary | ICD-10-CM | POA: Diagnosis not present

## 2017-06-18 DIAGNOSIS — M19042 Primary osteoarthritis, left hand: Secondary | ICD-10-CM | POA: Diagnosis not present

## 2017-06-18 DIAGNOSIS — Z483 Aftercare following surgery for neoplasm: Secondary | ICD-10-CM | POA: Diagnosis not present

## 2017-06-19 DIAGNOSIS — N184 Chronic kidney disease, stage 4 (severe): Secondary | ICD-10-CM | POA: Diagnosis not present

## 2017-06-19 DIAGNOSIS — I472 Ventricular tachycardia: Secondary | ICD-10-CM | POA: Diagnosis not present

## 2017-06-20 DIAGNOSIS — N183 Chronic kidney disease, stage 3 (moderate): Secondary | ICD-10-CM | POA: Diagnosis not present

## 2017-06-20 DIAGNOSIS — M19012 Primary osteoarthritis, left shoulder: Secondary | ICD-10-CM | POA: Diagnosis not present

## 2017-06-20 DIAGNOSIS — I129 Hypertensive chronic kidney disease with stage 1 through stage 4 chronic kidney disease, or unspecified chronic kidney disease: Secondary | ICD-10-CM | POA: Diagnosis not present

## 2017-06-20 DIAGNOSIS — Z483 Aftercare following surgery for neoplasm: Secondary | ICD-10-CM | POA: Diagnosis not present

## 2017-06-20 DIAGNOSIS — M19042 Primary osteoarthritis, left hand: Secondary | ICD-10-CM | POA: Diagnosis not present

## 2017-06-20 DIAGNOSIS — R7309 Other abnormal glucose: Secondary | ICD-10-CM | POA: Diagnosis not present

## 2017-06-20 DIAGNOSIS — I472 Ventricular tachycardia: Secondary | ICD-10-CM | POA: Diagnosis not present

## 2017-06-20 DIAGNOSIS — J9610 Chronic respiratory failure, unspecified whether with hypoxia or hypercapnia: Secondary | ICD-10-CM | POA: Diagnosis not present

## 2017-06-20 DIAGNOSIS — C7492 Malignant neoplasm of unspecified part of left adrenal gland: Secondary | ICD-10-CM | POA: Diagnosis not present

## 2017-06-20 DIAGNOSIS — M1612 Unilateral primary osteoarthritis, left hip: Secondary | ICD-10-CM | POA: Diagnosis not present

## 2017-06-20 DIAGNOSIS — M19022 Primary osteoarthritis, left elbow: Secondary | ICD-10-CM | POA: Diagnosis not present

## 2017-06-20 DIAGNOSIS — M1712 Unilateral primary osteoarthritis, left knee: Secondary | ICD-10-CM | POA: Diagnosis not present

## 2017-06-21 DIAGNOSIS — N183 Chronic kidney disease, stage 3 (moderate): Secondary | ICD-10-CM | POA: Diagnosis not present

## 2017-06-21 DIAGNOSIS — M19042 Primary osteoarthritis, left hand: Secondary | ICD-10-CM | POA: Diagnosis not present

## 2017-06-21 DIAGNOSIS — C7492 Malignant neoplasm of unspecified part of left adrenal gland: Secondary | ICD-10-CM | POA: Diagnosis not present

## 2017-06-21 DIAGNOSIS — Z483 Aftercare following surgery for neoplasm: Secondary | ICD-10-CM | POA: Diagnosis not present

## 2017-06-21 DIAGNOSIS — M1712 Unilateral primary osteoarthritis, left knee: Secondary | ICD-10-CM | POA: Diagnosis not present

## 2017-06-21 DIAGNOSIS — M19022 Primary osteoarthritis, left elbow: Secondary | ICD-10-CM | POA: Diagnosis not present

## 2017-06-21 DIAGNOSIS — I129 Hypertensive chronic kidney disease with stage 1 through stage 4 chronic kidney disease, or unspecified chronic kidney disease: Secondary | ICD-10-CM | POA: Diagnosis not present

## 2017-06-21 DIAGNOSIS — J9610 Chronic respiratory failure, unspecified whether with hypoxia or hypercapnia: Secondary | ICD-10-CM | POA: Diagnosis not present

## 2017-06-21 DIAGNOSIS — M1612 Unilateral primary osteoarthritis, left hip: Secondary | ICD-10-CM | POA: Diagnosis not present

## 2017-06-21 DIAGNOSIS — M19012 Primary osteoarthritis, left shoulder: Secondary | ICD-10-CM | POA: Diagnosis not present

## 2017-06-22 DIAGNOSIS — M1612 Unilateral primary osteoarthritis, left hip: Secondary | ICD-10-CM | POA: Diagnosis not present

## 2017-06-22 DIAGNOSIS — G4733 Obstructive sleep apnea (adult) (pediatric): Secondary | ICD-10-CM | POA: Diagnosis not present

## 2017-06-22 DIAGNOSIS — C649 Malignant neoplasm of unspecified kidney, except renal pelvis: Secondary | ICD-10-CM | POA: Diagnosis not present

## 2017-06-22 DIAGNOSIS — M19022 Primary osteoarthritis, left elbow: Secondary | ICD-10-CM | POA: Diagnosis not present

## 2017-06-22 DIAGNOSIS — M1712 Unilateral primary osteoarthritis, left knee: Secondary | ICD-10-CM | POA: Diagnosis not present

## 2017-06-22 DIAGNOSIS — Z95818 Presence of other cardiac implants and grafts: Secondary | ICD-10-CM | POA: Diagnosis not present

## 2017-06-22 DIAGNOSIS — Z483 Aftercare following surgery for neoplasm: Secondary | ICD-10-CM | POA: Diagnosis not present

## 2017-06-22 DIAGNOSIS — I472 Ventricular tachycardia: Secondary | ICD-10-CM | POA: Diagnosis not present

## 2017-06-22 DIAGNOSIS — N183 Chronic kidney disease, stage 3 (moderate): Secondary | ICD-10-CM | POA: Diagnosis not present

## 2017-06-22 DIAGNOSIS — M19012 Primary osteoarthritis, left shoulder: Secondary | ICD-10-CM | POA: Diagnosis not present

## 2017-06-22 DIAGNOSIS — C7492 Malignant neoplasm of unspecified part of left adrenal gland: Secondary | ICD-10-CM | POA: Diagnosis not present

## 2017-06-22 DIAGNOSIS — Z9889 Other specified postprocedural states: Secondary | ICD-10-CM | POA: Diagnosis not present

## 2017-06-22 DIAGNOSIS — M19042 Primary osteoarthritis, left hand: Secondary | ICD-10-CM | POA: Diagnosis not present

## 2017-06-22 DIAGNOSIS — I129 Hypertensive chronic kidney disease with stage 1 through stage 4 chronic kidney disease, or unspecified chronic kidney disease: Secondary | ICD-10-CM | POA: Diagnosis not present

## 2017-06-22 DIAGNOSIS — J9610 Chronic respiratory failure, unspecified whether with hypoxia or hypercapnia: Secondary | ICD-10-CM | POA: Diagnosis not present

## 2017-06-25 DIAGNOSIS — C7492 Malignant neoplasm of unspecified part of left adrenal gland: Secondary | ICD-10-CM | POA: Diagnosis not present

## 2017-06-25 DIAGNOSIS — I129 Hypertensive chronic kidney disease with stage 1 through stage 4 chronic kidney disease, or unspecified chronic kidney disease: Secondary | ICD-10-CM | POA: Diagnosis not present

## 2017-06-25 DIAGNOSIS — I2510 Atherosclerotic heart disease of native coronary artery without angina pectoris: Secondary | ICD-10-CM | POA: Diagnosis not present

## 2017-06-25 DIAGNOSIS — N183 Chronic kidney disease, stage 3 (moderate): Secondary | ICD-10-CM | POA: Diagnosis not present

## 2017-06-25 DIAGNOSIS — M1712 Unilateral primary osteoarthritis, left knee: Secondary | ICD-10-CM | POA: Diagnosis not present

## 2017-06-25 DIAGNOSIS — M19012 Primary osteoarthritis, left shoulder: Secondary | ICD-10-CM | POA: Diagnosis not present

## 2017-06-25 DIAGNOSIS — J9610 Chronic respiratory failure, unspecified whether with hypoxia or hypercapnia: Secondary | ICD-10-CM | POA: Diagnosis not present

## 2017-06-25 DIAGNOSIS — I472 Ventricular tachycardia: Secondary | ICD-10-CM | POA: Diagnosis not present

## 2017-06-25 DIAGNOSIS — M19022 Primary osteoarthritis, left elbow: Secondary | ICD-10-CM | POA: Diagnosis not present

## 2017-06-25 DIAGNOSIS — M1612 Unilateral primary osteoarthritis, left hip: Secondary | ICD-10-CM | POA: Diagnosis not present

## 2017-06-25 DIAGNOSIS — M19042 Primary osteoarthritis, left hand: Secondary | ICD-10-CM | POA: Diagnosis not present

## 2017-06-26 DIAGNOSIS — I472 Ventricular tachycardia: Secondary | ICD-10-CM | POA: Diagnosis not present

## 2017-06-26 DIAGNOSIS — M19012 Primary osteoarthritis, left shoulder: Secondary | ICD-10-CM | POA: Diagnosis not present

## 2017-06-26 DIAGNOSIS — I2510 Atherosclerotic heart disease of native coronary artery without angina pectoris: Secondary | ICD-10-CM | POA: Diagnosis not present

## 2017-06-26 DIAGNOSIS — M1612 Unilateral primary osteoarthritis, left hip: Secondary | ICD-10-CM | POA: Diagnosis not present

## 2017-06-26 DIAGNOSIS — M1712 Unilateral primary osteoarthritis, left knee: Secondary | ICD-10-CM | POA: Diagnosis not present

## 2017-06-26 DIAGNOSIS — J9610 Chronic respiratory failure, unspecified whether with hypoxia or hypercapnia: Secondary | ICD-10-CM | POA: Diagnosis not present

## 2017-06-26 DIAGNOSIS — I129 Hypertensive chronic kidney disease with stage 1 through stage 4 chronic kidney disease, or unspecified chronic kidney disease: Secondary | ICD-10-CM | POA: Diagnosis not present

## 2017-06-26 DIAGNOSIS — C7492 Malignant neoplasm of unspecified part of left adrenal gland: Secondary | ICD-10-CM | POA: Diagnosis not present

## 2017-06-26 DIAGNOSIS — M19042 Primary osteoarthritis, left hand: Secondary | ICD-10-CM | POA: Diagnosis not present

## 2017-06-26 DIAGNOSIS — M19022 Primary osteoarthritis, left elbow: Secondary | ICD-10-CM | POA: Diagnosis not present

## 2017-06-26 DIAGNOSIS — N183 Chronic kidney disease, stage 3 (moderate): Secondary | ICD-10-CM | POA: Diagnosis not present

## 2017-06-27 DIAGNOSIS — M1712 Unilateral primary osteoarthritis, left knee: Secondary | ICD-10-CM | POA: Diagnosis not present

## 2017-06-27 DIAGNOSIS — N183 Chronic kidney disease, stage 3 (moderate): Secondary | ICD-10-CM | POA: Diagnosis not present

## 2017-06-27 DIAGNOSIS — I129 Hypertensive chronic kidney disease with stage 1 through stage 4 chronic kidney disease, or unspecified chronic kidney disease: Secondary | ICD-10-CM | POA: Diagnosis not present

## 2017-06-27 DIAGNOSIS — M19012 Primary osteoarthritis, left shoulder: Secondary | ICD-10-CM | POA: Diagnosis not present

## 2017-06-27 DIAGNOSIS — M19042 Primary osteoarthritis, left hand: Secondary | ICD-10-CM | POA: Diagnosis not present

## 2017-06-27 DIAGNOSIS — I472 Ventricular tachycardia: Secondary | ICD-10-CM | POA: Diagnosis not present

## 2017-06-27 DIAGNOSIS — J9610 Chronic respiratory failure, unspecified whether with hypoxia or hypercapnia: Secondary | ICD-10-CM | POA: Diagnosis not present

## 2017-06-27 DIAGNOSIS — M1612 Unilateral primary osteoarthritis, left hip: Secondary | ICD-10-CM | POA: Diagnosis not present

## 2017-06-27 DIAGNOSIS — M19022 Primary osteoarthritis, left elbow: Secondary | ICD-10-CM | POA: Diagnosis not present

## 2017-06-27 DIAGNOSIS — C7492 Malignant neoplasm of unspecified part of left adrenal gland: Secondary | ICD-10-CM | POA: Diagnosis not present

## 2017-06-27 DIAGNOSIS — I2510 Atherosclerotic heart disease of native coronary artery without angina pectoris: Secondary | ICD-10-CM | POA: Diagnosis not present

## 2017-06-28 DIAGNOSIS — M1712 Unilateral primary osteoarthritis, left knee: Secondary | ICD-10-CM | POA: Diagnosis not present

## 2017-06-28 DIAGNOSIS — I129 Hypertensive chronic kidney disease with stage 1 through stage 4 chronic kidney disease, or unspecified chronic kidney disease: Secondary | ICD-10-CM | POA: Diagnosis not present

## 2017-06-28 DIAGNOSIS — I2510 Atherosclerotic heart disease of native coronary artery without angina pectoris: Secondary | ICD-10-CM | POA: Diagnosis not present

## 2017-06-28 DIAGNOSIS — N183 Chronic kidney disease, stage 3 (moderate): Secondary | ICD-10-CM | POA: Diagnosis not present

## 2017-06-28 DIAGNOSIS — I472 Ventricular tachycardia: Secondary | ICD-10-CM | POA: Diagnosis not present

## 2017-06-28 DIAGNOSIS — M19042 Primary osteoarthritis, left hand: Secondary | ICD-10-CM | POA: Diagnosis not present

## 2017-06-28 DIAGNOSIS — M19012 Primary osteoarthritis, left shoulder: Secondary | ICD-10-CM | POA: Diagnosis not present

## 2017-06-28 DIAGNOSIS — M19022 Primary osteoarthritis, left elbow: Secondary | ICD-10-CM | POA: Diagnosis not present

## 2017-06-28 DIAGNOSIS — J9610 Chronic respiratory failure, unspecified whether with hypoxia or hypercapnia: Secondary | ICD-10-CM | POA: Diagnosis not present

## 2017-06-28 DIAGNOSIS — C7492 Malignant neoplasm of unspecified part of left adrenal gland: Secondary | ICD-10-CM | POA: Diagnosis not present

## 2017-06-28 DIAGNOSIS — M1612 Unilateral primary osteoarthritis, left hip: Secondary | ICD-10-CM | POA: Diagnosis not present

## 2017-06-29 DIAGNOSIS — J9610 Chronic respiratory failure, unspecified whether with hypoxia or hypercapnia: Secondary | ICD-10-CM | POA: Diagnosis not present

## 2017-06-29 DIAGNOSIS — I129 Hypertensive chronic kidney disease with stage 1 through stage 4 chronic kidney disease, or unspecified chronic kidney disease: Secondary | ICD-10-CM | POA: Diagnosis not present

## 2017-06-29 DIAGNOSIS — N183 Chronic kidney disease, stage 3 (moderate): Secondary | ICD-10-CM | POA: Diagnosis not present

## 2017-06-29 DIAGNOSIS — M19042 Primary osteoarthritis, left hand: Secondary | ICD-10-CM | POA: Diagnosis not present

## 2017-06-29 DIAGNOSIS — M1612 Unilateral primary osteoarthritis, left hip: Secondary | ICD-10-CM | POA: Diagnosis not present

## 2017-06-29 DIAGNOSIS — I472 Ventricular tachycardia: Secondary | ICD-10-CM | POA: Diagnosis not present

## 2017-06-29 DIAGNOSIS — M19012 Primary osteoarthritis, left shoulder: Secondary | ICD-10-CM | POA: Diagnosis not present

## 2017-06-29 DIAGNOSIS — C7492 Malignant neoplasm of unspecified part of left adrenal gland: Secondary | ICD-10-CM | POA: Diagnosis not present

## 2017-06-29 DIAGNOSIS — M19022 Primary osteoarthritis, left elbow: Secondary | ICD-10-CM | POA: Diagnosis not present

## 2017-06-29 DIAGNOSIS — I2510 Atherosclerotic heart disease of native coronary artery without angina pectoris: Secondary | ICD-10-CM | POA: Diagnosis not present

## 2017-06-29 DIAGNOSIS — M1712 Unilateral primary osteoarthritis, left knee: Secondary | ICD-10-CM | POA: Diagnosis not present

## 2017-07-02 DIAGNOSIS — N183 Chronic kidney disease, stage 3 (moderate): Secondary | ICD-10-CM | POA: Diagnosis not present

## 2017-07-02 DIAGNOSIS — M19022 Primary osteoarthritis, left elbow: Secondary | ICD-10-CM | POA: Diagnosis not present

## 2017-07-02 DIAGNOSIS — J9610 Chronic respiratory failure, unspecified whether with hypoxia or hypercapnia: Secondary | ICD-10-CM | POA: Diagnosis not present

## 2017-07-02 DIAGNOSIS — I129 Hypertensive chronic kidney disease with stage 1 through stage 4 chronic kidney disease, or unspecified chronic kidney disease: Secondary | ICD-10-CM | POA: Diagnosis not present

## 2017-07-02 DIAGNOSIS — M1612 Unilateral primary osteoarthritis, left hip: Secondary | ICD-10-CM | POA: Diagnosis not present

## 2017-07-02 DIAGNOSIS — I2510 Atherosclerotic heart disease of native coronary artery without angina pectoris: Secondary | ICD-10-CM | POA: Diagnosis not present

## 2017-07-02 DIAGNOSIS — I472 Ventricular tachycardia: Secondary | ICD-10-CM | POA: Diagnosis not present

## 2017-07-02 DIAGNOSIS — M19042 Primary osteoarthritis, left hand: Secondary | ICD-10-CM | POA: Diagnosis not present

## 2017-07-02 DIAGNOSIS — M19012 Primary osteoarthritis, left shoulder: Secondary | ICD-10-CM | POA: Diagnosis not present

## 2017-07-02 DIAGNOSIS — M1712 Unilateral primary osteoarthritis, left knee: Secondary | ICD-10-CM | POA: Diagnosis not present

## 2017-07-02 DIAGNOSIS — C7492 Malignant neoplasm of unspecified part of left adrenal gland: Secondary | ICD-10-CM | POA: Diagnosis not present

## 2017-07-03 DIAGNOSIS — I129 Hypertensive chronic kidney disease with stage 1 through stage 4 chronic kidney disease, or unspecified chronic kidney disease: Secondary | ICD-10-CM | POA: Diagnosis not present

## 2017-07-03 DIAGNOSIS — C7492 Malignant neoplasm of unspecified part of left adrenal gland: Secondary | ICD-10-CM | POA: Diagnosis not present

## 2017-07-03 DIAGNOSIS — M19012 Primary osteoarthritis, left shoulder: Secondary | ICD-10-CM | POA: Diagnosis not present

## 2017-07-03 DIAGNOSIS — M19042 Primary osteoarthritis, left hand: Secondary | ICD-10-CM | POA: Diagnosis not present

## 2017-07-03 DIAGNOSIS — M1612 Unilateral primary osteoarthritis, left hip: Secondary | ICD-10-CM | POA: Diagnosis not present

## 2017-07-03 DIAGNOSIS — I472 Ventricular tachycardia: Secondary | ICD-10-CM | POA: Diagnosis not present

## 2017-07-03 DIAGNOSIS — N183 Chronic kidney disease, stage 3 (moderate): Secondary | ICD-10-CM | POA: Diagnosis not present

## 2017-07-03 DIAGNOSIS — J9610 Chronic respiratory failure, unspecified whether with hypoxia or hypercapnia: Secondary | ICD-10-CM | POA: Diagnosis not present

## 2017-07-03 DIAGNOSIS — I2510 Atherosclerotic heart disease of native coronary artery without angina pectoris: Secondary | ICD-10-CM | POA: Diagnosis not present

## 2017-07-03 DIAGNOSIS — M19022 Primary osteoarthritis, left elbow: Secondary | ICD-10-CM | POA: Diagnosis not present

## 2017-07-03 DIAGNOSIS — M1712 Unilateral primary osteoarthritis, left knee: Secondary | ICD-10-CM | POA: Diagnosis not present

## 2017-07-04 DIAGNOSIS — I472 Ventricular tachycardia: Secondary | ICD-10-CM | POA: Diagnosis not present

## 2017-07-04 DIAGNOSIS — M1612 Unilateral primary osteoarthritis, left hip: Secondary | ICD-10-CM | POA: Diagnosis not present

## 2017-07-04 DIAGNOSIS — J9610 Chronic respiratory failure, unspecified whether with hypoxia or hypercapnia: Secondary | ICD-10-CM | POA: Diagnosis not present

## 2017-07-04 DIAGNOSIS — M1712 Unilateral primary osteoarthritis, left knee: Secondary | ICD-10-CM | POA: Diagnosis not present

## 2017-07-04 DIAGNOSIS — M19022 Primary osteoarthritis, left elbow: Secondary | ICD-10-CM | POA: Diagnosis not present

## 2017-07-04 DIAGNOSIS — I2510 Atherosclerotic heart disease of native coronary artery without angina pectoris: Secondary | ICD-10-CM | POA: Diagnosis not present

## 2017-07-04 DIAGNOSIS — N183 Chronic kidney disease, stage 3 (moderate): Secondary | ICD-10-CM | POA: Diagnosis not present

## 2017-07-04 DIAGNOSIS — C7492 Malignant neoplasm of unspecified part of left adrenal gland: Secondary | ICD-10-CM | POA: Diagnosis not present

## 2017-07-04 DIAGNOSIS — M19012 Primary osteoarthritis, left shoulder: Secondary | ICD-10-CM | POA: Diagnosis not present

## 2017-07-04 DIAGNOSIS — I129 Hypertensive chronic kidney disease with stage 1 through stage 4 chronic kidney disease, or unspecified chronic kidney disease: Secondary | ICD-10-CM | POA: Diagnosis not present

## 2017-07-04 DIAGNOSIS — M19042 Primary osteoarthritis, left hand: Secondary | ICD-10-CM | POA: Diagnosis not present

## 2017-07-06 DIAGNOSIS — M19042 Primary osteoarthritis, left hand: Secondary | ICD-10-CM | POA: Diagnosis not present

## 2017-07-06 DIAGNOSIS — J9610 Chronic respiratory failure, unspecified whether with hypoxia or hypercapnia: Secondary | ICD-10-CM | POA: Diagnosis not present

## 2017-07-06 DIAGNOSIS — I2510 Atherosclerotic heart disease of native coronary artery without angina pectoris: Secondary | ICD-10-CM | POA: Diagnosis not present

## 2017-07-06 DIAGNOSIS — M19012 Primary osteoarthritis, left shoulder: Secondary | ICD-10-CM | POA: Diagnosis not present

## 2017-07-06 DIAGNOSIS — I472 Ventricular tachycardia: Secondary | ICD-10-CM | POA: Diagnosis not present

## 2017-07-06 DIAGNOSIS — I129 Hypertensive chronic kidney disease with stage 1 through stage 4 chronic kidney disease, or unspecified chronic kidney disease: Secondary | ICD-10-CM | POA: Diagnosis not present

## 2017-07-06 DIAGNOSIS — M1612 Unilateral primary osteoarthritis, left hip: Secondary | ICD-10-CM | POA: Diagnosis not present

## 2017-07-06 DIAGNOSIS — M1712 Unilateral primary osteoarthritis, left knee: Secondary | ICD-10-CM | POA: Diagnosis not present

## 2017-07-06 DIAGNOSIS — C7492 Malignant neoplasm of unspecified part of left adrenal gland: Secondary | ICD-10-CM | POA: Diagnosis not present

## 2017-07-06 DIAGNOSIS — N183 Chronic kidney disease, stage 3 (moderate): Secondary | ICD-10-CM | POA: Diagnosis not present

## 2017-07-06 DIAGNOSIS — M19022 Primary osteoarthritis, left elbow: Secondary | ICD-10-CM | POA: Diagnosis not present

## 2017-07-09 DIAGNOSIS — M19042 Primary osteoarthritis, left hand: Secondary | ICD-10-CM | POA: Diagnosis not present

## 2017-07-09 DIAGNOSIS — M19022 Primary osteoarthritis, left elbow: Secondary | ICD-10-CM | POA: Diagnosis not present

## 2017-07-09 DIAGNOSIS — M1612 Unilateral primary osteoarthritis, left hip: Secondary | ICD-10-CM | POA: Diagnosis not present

## 2017-07-09 DIAGNOSIS — J9610 Chronic respiratory failure, unspecified whether with hypoxia or hypercapnia: Secondary | ICD-10-CM | POA: Diagnosis not present

## 2017-07-09 DIAGNOSIS — I129 Hypertensive chronic kidney disease with stage 1 through stage 4 chronic kidney disease, or unspecified chronic kidney disease: Secondary | ICD-10-CM | POA: Diagnosis not present

## 2017-07-09 DIAGNOSIS — M1712 Unilateral primary osteoarthritis, left knee: Secondary | ICD-10-CM | POA: Diagnosis not present

## 2017-07-09 DIAGNOSIS — C7492 Malignant neoplasm of unspecified part of left adrenal gland: Secondary | ICD-10-CM | POA: Diagnosis not present

## 2017-07-09 DIAGNOSIS — M19012 Primary osteoarthritis, left shoulder: Secondary | ICD-10-CM | POA: Diagnosis not present

## 2017-07-09 DIAGNOSIS — I2510 Atherosclerotic heart disease of native coronary artery without angina pectoris: Secondary | ICD-10-CM | POA: Diagnosis not present

## 2017-07-09 DIAGNOSIS — I472 Ventricular tachycardia: Secondary | ICD-10-CM | POA: Diagnosis not present

## 2017-07-09 DIAGNOSIS — N183 Chronic kidney disease, stage 3 (moderate): Secondary | ICD-10-CM | POA: Diagnosis not present

## 2017-07-10 DIAGNOSIS — R7309 Other abnormal glucose: Secondary | ICD-10-CM | POA: Diagnosis not present

## 2017-07-10 DIAGNOSIS — Z85528 Personal history of other malignant neoplasm of kidney: Secondary | ICD-10-CM | POA: Diagnosis not present

## 2017-07-10 DIAGNOSIS — C642 Malignant neoplasm of left kidney, except renal pelvis: Secondary | ICD-10-CM | POA: Diagnosis not present

## 2017-07-10 DIAGNOSIS — Z905 Acquired absence of kidney: Secondary | ICD-10-CM | POA: Diagnosis not present

## 2017-07-10 DIAGNOSIS — C641 Malignant neoplasm of right kidney, except renal pelvis: Secondary | ICD-10-CM | POA: Diagnosis not present

## 2017-07-10 DIAGNOSIS — Z9889 Other specified postprocedural states: Secondary | ICD-10-CM | POA: Diagnosis not present

## 2017-07-11 DIAGNOSIS — I472 Ventricular tachycardia: Secondary | ICD-10-CM | POA: Diagnosis not present

## 2017-07-11 DIAGNOSIS — I2510 Atherosclerotic heart disease of native coronary artery without angina pectoris: Secondary | ICD-10-CM | POA: Diagnosis not present

## 2017-07-11 DIAGNOSIS — M19012 Primary osteoarthritis, left shoulder: Secondary | ICD-10-CM | POA: Diagnosis not present

## 2017-07-11 DIAGNOSIS — M19042 Primary osteoarthritis, left hand: Secondary | ICD-10-CM | POA: Diagnosis not present

## 2017-07-11 DIAGNOSIS — C7492 Malignant neoplasm of unspecified part of left adrenal gland: Secondary | ICD-10-CM | POA: Diagnosis not present

## 2017-07-11 DIAGNOSIS — N183 Chronic kidney disease, stage 3 (moderate): Secondary | ICD-10-CM | POA: Diagnosis not present

## 2017-07-11 DIAGNOSIS — I129 Hypertensive chronic kidney disease with stage 1 through stage 4 chronic kidney disease, or unspecified chronic kidney disease: Secondary | ICD-10-CM | POA: Diagnosis not present

## 2017-07-11 DIAGNOSIS — J9610 Chronic respiratory failure, unspecified whether with hypoxia or hypercapnia: Secondary | ICD-10-CM | POA: Diagnosis not present

## 2017-07-11 DIAGNOSIS — M19022 Primary osteoarthritis, left elbow: Secondary | ICD-10-CM | POA: Diagnosis not present

## 2017-07-11 DIAGNOSIS — M1612 Unilateral primary osteoarthritis, left hip: Secondary | ICD-10-CM | POA: Diagnosis not present

## 2017-07-11 DIAGNOSIS — M1712 Unilateral primary osteoarthritis, left knee: Secondary | ICD-10-CM | POA: Diagnosis not present

## 2017-07-12 DIAGNOSIS — M19012 Primary osteoarthritis, left shoulder: Secondary | ICD-10-CM | POA: Diagnosis not present

## 2017-07-12 DIAGNOSIS — I2510 Atherosclerotic heart disease of native coronary artery without angina pectoris: Secondary | ICD-10-CM | POA: Diagnosis not present

## 2017-07-12 DIAGNOSIS — I472 Ventricular tachycardia: Secondary | ICD-10-CM | POA: Diagnosis not present

## 2017-07-12 DIAGNOSIS — I129 Hypertensive chronic kidney disease with stage 1 through stage 4 chronic kidney disease, or unspecified chronic kidney disease: Secondary | ICD-10-CM | POA: Diagnosis not present

## 2017-07-12 DIAGNOSIS — M19042 Primary osteoarthritis, left hand: Secondary | ICD-10-CM | POA: Diagnosis not present

## 2017-07-12 DIAGNOSIS — M19022 Primary osteoarthritis, left elbow: Secondary | ICD-10-CM | POA: Diagnosis not present

## 2017-07-12 DIAGNOSIS — M1712 Unilateral primary osteoarthritis, left knee: Secondary | ICD-10-CM | POA: Diagnosis not present

## 2017-07-12 DIAGNOSIS — J9610 Chronic respiratory failure, unspecified whether with hypoxia or hypercapnia: Secondary | ICD-10-CM | POA: Diagnosis not present

## 2017-07-12 DIAGNOSIS — N183 Chronic kidney disease, stage 3 (moderate): Secondary | ICD-10-CM | POA: Diagnosis not present

## 2017-07-12 DIAGNOSIS — C7492 Malignant neoplasm of unspecified part of left adrenal gland: Secondary | ICD-10-CM | POA: Diagnosis not present

## 2017-07-12 DIAGNOSIS — M1612 Unilateral primary osteoarthritis, left hip: Secondary | ICD-10-CM | POA: Diagnosis not present

## 2017-07-13 DIAGNOSIS — M19042 Primary osteoarthritis, left hand: Secondary | ICD-10-CM | POA: Diagnosis not present

## 2017-07-13 DIAGNOSIS — M1712 Unilateral primary osteoarthritis, left knee: Secondary | ICD-10-CM | POA: Diagnosis not present

## 2017-07-13 DIAGNOSIS — M1612 Unilateral primary osteoarthritis, left hip: Secondary | ICD-10-CM | POA: Diagnosis not present

## 2017-07-13 DIAGNOSIS — J9610 Chronic respiratory failure, unspecified whether with hypoxia or hypercapnia: Secondary | ICD-10-CM | POA: Diagnosis not present

## 2017-07-13 DIAGNOSIS — I472 Ventricular tachycardia: Secondary | ICD-10-CM | POA: Diagnosis not present

## 2017-07-13 DIAGNOSIS — M19022 Primary osteoarthritis, left elbow: Secondary | ICD-10-CM | POA: Diagnosis not present

## 2017-07-13 DIAGNOSIS — I129 Hypertensive chronic kidney disease with stage 1 through stage 4 chronic kidney disease, or unspecified chronic kidney disease: Secondary | ICD-10-CM | POA: Diagnosis not present

## 2017-07-13 DIAGNOSIS — M19012 Primary osteoarthritis, left shoulder: Secondary | ICD-10-CM | POA: Diagnosis not present

## 2017-07-13 DIAGNOSIS — C7492 Malignant neoplasm of unspecified part of left adrenal gland: Secondary | ICD-10-CM | POA: Diagnosis not present

## 2017-07-13 DIAGNOSIS — I2510 Atherosclerotic heart disease of native coronary artery without angina pectoris: Secondary | ICD-10-CM | POA: Diagnosis not present

## 2017-07-13 DIAGNOSIS — N183 Chronic kidney disease, stage 3 (moderate): Secondary | ICD-10-CM | POA: Diagnosis not present

## 2017-07-16 DIAGNOSIS — M19022 Primary osteoarthritis, left elbow: Secondary | ICD-10-CM | POA: Diagnosis not present

## 2017-07-16 DIAGNOSIS — I472 Ventricular tachycardia: Secondary | ICD-10-CM | POA: Diagnosis not present

## 2017-07-16 DIAGNOSIS — C7492 Malignant neoplasm of unspecified part of left adrenal gland: Secondary | ICD-10-CM | POA: Diagnosis not present

## 2017-07-16 DIAGNOSIS — M19042 Primary osteoarthritis, left hand: Secondary | ICD-10-CM | POA: Diagnosis not present

## 2017-07-16 DIAGNOSIS — M1612 Unilateral primary osteoarthritis, left hip: Secondary | ICD-10-CM | POA: Diagnosis not present

## 2017-07-16 DIAGNOSIS — M1712 Unilateral primary osteoarthritis, left knee: Secondary | ICD-10-CM | POA: Diagnosis not present

## 2017-07-16 DIAGNOSIS — J9610 Chronic respiratory failure, unspecified whether with hypoxia or hypercapnia: Secondary | ICD-10-CM | POA: Diagnosis not present

## 2017-07-16 DIAGNOSIS — I2510 Atherosclerotic heart disease of native coronary artery without angina pectoris: Secondary | ICD-10-CM | POA: Diagnosis not present

## 2017-07-16 DIAGNOSIS — N183 Chronic kidney disease, stage 3 (moderate): Secondary | ICD-10-CM | POA: Diagnosis not present

## 2017-07-16 DIAGNOSIS — M19012 Primary osteoarthritis, left shoulder: Secondary | ICD-10-CM | POA: Diagnosis not present

## 2017-07-16 DIAGNOSIS — I129 Hypertensive chronic kidney disease with stage 1 through stage 4 chronic kidney disease, or unspecified chronic kidney disease: Secondary | ICD-10-CM | POA: Diagnosis not present

## 2017-07-17 DIAGNOSIS — N183 Chronic kidney disease, stage 3 (moderate): Secondary | ICD-10-CM | POA: Diagnosis not present

## 2017-07-17 DIAGNOSIS — I472 Ventricular tachycardia: Secondary | ICD-10-CM | POA: Diagnosis not present

## 2017-07-17 DIAGNOSIS — M19012 Primary osteoarthritis, left shoulder: Secondary | ICD-10-CM | POA: Diagnosis not present

## 2017-07-17 DIAGNOSIS — I2510 Atherosclerotic heart disease of native coronary artery without angina pectoris: Secondary | ICD-10-CM | POA: Diagnosis not present

## 2017-07-17 DIAGNOSIS — M1612 Unilateral primary osteoarthritis, left hip: Secondary | ICD-10-CM | POA: Diagnosis not present

## 2017-07-17 DIAGNOSIS — M19042 Primary osteoarthritis, left hand: Secondary | ICD-10-CM | POA: Diagnosis not present

## 2017-07-17 DIAGNOSIS — M19022 Primary osteoarthritis, left elbow: Secondary | ICD-10-CM | POA: Diagnosis not present

## 2017-07-17 DIAGNOSIS — I129 Hypertensive chronic kidney disease with stage 1 through stage 4 chronic kidney disease, or unspecified chronic kidney disease: Secondary | ICD-10-CM | POA: Diagnosis not present

## 2017-07-17 DIAGNOSIS — M1712 Unilateral primary osteoarthritis, left knee: Secondary | ICD-10-CM | POA: Diagnosis not present

## 2017-07-17 DIAGNOSIS — J9610 Chronic respiratory failure, unspecified whether with hypoxia or hypercapnia: Secondary | ICD-10-CM | POA: Diagnosis not present

## 2017-07-17 DIAGNOSIS — C7492 Malignant neoplasm of unspecified part of left adrenal gland: Secondary | ICD-10-CM | POA: Diagnosis not present

## 2017-07-18 DIAGNOSIS — M1712 Unilateral primary osteoarthritis, left knee: Secondary | ICD-10-CM | POA: Diagnosis not present

## 2017-07-18 DIAGNOSIS — I2510 Atherosclerotic heart disease of native coronary artery without angina pectoris: Secondary | ICD-10-CM | POA: Diagnosis not present

## 2017-07-18 DIAGNOSIS — I472 Ventricular tachycardia: Secondary | ICD-10-CM | POA: Diagnosis not present

## 2017-07-18 DIAGNOSIS — C7492 Malignant neoplasm of unspecified part of left adrenal gland: Secondary | ICD-10-CM | POA: Diagnosis not present

## 2017-07-18 DIAGNOSIS — N183 Chronic kidney disease, stage 3 (moderate): Secondary | ICD-10-CM | POA: Diagnosis not present

## 2017-07-18 DIAGNOSIS — M19012 Primary osteoarthritis, left shoulder: Secondary | ICD-10-CM | POA: Diagnosis not present

## 2017-07-18 DIAGNOSIS — J9610 Chronic respiratory failure, unspecified whether with hypoxia or hypercapnia: Secondary | ICD-10-CM | POA: Diagnosis not present

## 2017-07-18 DIAGNOSIS — M1612 Unilateral primary osteoarthritis, left hip: Secondary | ICD-10-CM | POA: Diagnosis not present

## 2017-07-18 DIAGNOSIS — M19022 Primary osteoarthritis, left elbow: Secondary | ICD-10-CM | POA: Diagnosis not present

## 2017-07-18 DIAGNOSIS — M19042 Primary osteoarthritis, left hand: Secondary | ICD-10-CM | POA: Diagnosis not present

## 2017-07-18 DIAGNOSIS — I129 Hypertensive chronic kidney disease with stage 1 through stage 4 chronic kidney disease, or unspecified chronic kidney disease: Secondary | ICD-10-CM | POA: Diagnosis not present

## 2017-07-19 DIAGNOSIS — M1712 Unilateral primary osteoarthritis, left knee: Secondary | ICD-10-CM | POA: Diagnosis not present

## 2017-07-19 DIAGNOSIS — N183 Chronic kidney disease, stage 3 (moderate): Secondary | ICD-10-CM | POA: Diagnosis not present

## 2017-07-19 DIAGNOSIS — I129 Hypertensive chronic kidney disease with stage 1 through stage 4 chronic kidney disease, or unspecified chronic kidney disease: Secondary | ICD-10-CM | POA: Diagnosis not present

## 2017-07-19 DIAGNOSIS — I472 Ventricular tachycardia: Secondary | ICD-10-CM | POA: Diagnosis not present

## 2017-07-19 DIAGNOSIS — M19012 Primary osteoarthritis, left shoulder: Secondary | ICD-10-CM | POA: Diagnosis not present

## 2017-07-19 DIAGNOSIS — M1612 Unilateral primary osteoarthritis, left hip: Secondary | ICD-10-CM | POA: Diagnosis not present

## 2017-07-19 DIAGNOSIS — I2510 Atherosclerotic heart disease of native coronary artery without angina pectoris: Secondary | ICD-10-CM | POA: Diagnosis not present

## 2017-07-19 DIAGNOSIS — M19022 Primary osteoarthritis, left elbow: Secondary | ICD-10-CM | POA: Diagnosis not present

## 2017-07-19 DIAGNOSIS — J9610 Chronic respiratory failure, unspecified whether with hypoxia or hypercapnia: Secondary | ICD-10-CM | POA: Diagnosis not present

## 2017-07-19 DIAGNOSIS — M19042 Primary osteoarthritis, left hand: Secondary | ICD-10-CM | POA: Diagnosis not present

## 2017-07-19 DIAGNOSIS — C7492 Malignant neoplasm of unspecified part of left adrenal gland: Secondary | ICD-10-CM | POA: Diagnosis not present

## 2017-07-20 DIAGNOSIS — N183 Chronic kidney disease, stage 3 (moderate): Secondary | ICD-10-CM | POA: Diagnosis not present

## 2017-07-20 DIAGNOSIS — M19042 Primary osteoarthritis, left hand: Secondary | ICD-10-CM | POA: Diagnosis not present

## 2017-07-20 DIAGNOSIS — M19022 Primary osteoarthritis, left elbow: Secondary | ICD-10-CM | POA: Diagnosis not present

## 2017-07-20 DIAGNOSIS — I2510 Atherosclerotic heart disease of native coronary artery without angina pectoris: Secondary | ICD-10-CM | POA: Diagnosis not present

## 2017-07-20 DIAGNOSIS — I129 Hypertensive chronic kidney disease with stage 1 through stage 4 chronic kidney disease, or unspecified chronic kidney disease: Secondary | ICD-10-CM | POA: Diagnosis not present

## 2017-07-20 DIAGNOSIS — M1612 Unilateral primary osteoarthritis, left hip: Secondary | ICD-10-CM | POA: Diagnosis not present

## 2017-07-20 DIAGNOSIS — C7492 Malignant neoplasm of unspecified part of left adrenal gland: Secondary | ICD-10-CM | POA: Diagnosis not present

## 2017-07-20 DIAGNOSIS — M1712 Unilateral primary osteoarthritis, left knee: Secondary | ICD-10-CM | POA: Diagnosis not present

## 2017-07-20 DIAGNOSIS — J9610 Chronic respiratory failure, unspecified whether with hypoxia or hypercapnia: Secondary | ICD-10-CM | POA: Diagnosis not present

## 2017-07-20 DIAGNOSIS — M19012 Primary osteoarthritis, left shoulder: Secondary | ICD-10-CM | POA: Diagnosis not present

## 2017-07-20 DIAGNOSIS — I472 Ventricular tachycardia: Secondary | ICD-10-CM | POA: Diagnosis not present

## 2017-07-24 DIAGNOSIS — M1712 Unilateral primary osteoarthritis, left knee: Secondary | ICD-10-CM | POA: Diagnosis not present

## 2017-07-24 DIAGNOSIS — C7492 Malignant neoplasm of unspecified part of left adrenal gland: Secondary | ICD-10-CM | POA: Diagnosis not present

## 2017-07-24 DIAGNOSIS — M19022 Primary osteoarthritis, left elbow: Secondary | ICD-10-CM | POA: Diagnosis not present

## 2017-07-24 DIAGNOSIS — N183 Chronic kidney disease, stage 3 (moderate): Secondary | ICD-10-CM | POA: Diagnosis not present

## 2017-07-24 DIAGNOSIS — M1612 Unilateral primary osteoarthritis, left hip: Secondary | ICD-10-CM | POA: Diagnosis not present

## 2017-07-24 DIAGNOSIS — I129 Hypertensive chronic kidney disease with stage 1 through stage 4 chronic kidney disease, or unspecified chronic kidney disease: Secondary | ICD-10-CM | POA: Diagnosis not present

## 2017-07-24 DIAGNOSIS — I2510 Atherosclerotic heart disease of native coronary artery without angina pectoris: Secondary | ICD-10-CM | POA: Diagnosis not present

## 2017-07-24 DIAGNOSIS — M19042 Primary osteoarthritis, left hand: Secondary | ICD-10-CM | POA: Diagnosis not present

## 2017-07-24 DIAGNOSIS — I472 Ventricular tachycardia: Secondary | ICD-10-CM | POA: Diagnosis not present

## 2017-07-24 DIAGNOSIS — M19012 Primary osteoarthritis, left shoulder: Secondary | ICD-10-CM | POA: Diagnosis not present

## 2017-07-24 DIAGNOSIS — J9610 Chronic respiratory failure, unspecified whether with hypoxia or hypercapnia: Secondary | ICD-10-CM | POA: Diagnosis not present

## 2017-07-25 DIAGNOSIS — M19022 Primary osteoarthritis, left elbow: Secondary | ICD-10-CM | POA: Diagnosis not present

## 2017-07-25 DIAGNOSIS — N183 Chronic kidney disease, stage 3 (moderate): Secondary | ICD-10-CM | POA: Diagnosis not present

## 2017-07-25 DIAGNOSIS — J9610 Chronic respiratory failure, unspecified whether with hypoxia or hypercapnia: Secondary | ICD-10-CM | POA: Diagnosis not present

## 2017-07-25 DIAGNOSIS — I2510 Atherosclerotic heart disease of native coronary artery without angina pectoris: Secondary | ICD-10-CM | POA: Diagnosis not present

## 2017-07-25 DIAGNOSIS — M19012 Primary osteoarthritis, left shoulder: Secondary | ICD-10-CM | POA: Diagnosis not present

## 2017-07-25 DIAGNOSIS — M1612 Unilateral primary osteoarthritis, left hip: Secondary | ICD-10-CM | POA: Diagnosis not present

## 2017-07-25 DIAGNOSIS — M19042 Primary osteoarthritis, left hand: Secondary | ICD-10-CM | POA: Diagnosis not present

## 2017-07-25 DIAGNOSIS — M1712 Unilateral primary osteoarthritis, left knee: Secondary | ICD-10-CM | POA: Diagnosis not present

## 2017-07-25 DIAGNOSIS — C7492 Malignant neoplasm of unspecified part of left adrenal gland: Secondary | ICD-10-CM | POA: Diagnosis not present

## 2017-07-25 DIAGNOSIS — I472 Ventricular tachycardia: Secondary | ICD-10-CM | POA: Diagnosis not present

## 2017-07-25 DIAGNOSIS — I129 Hypertensive chronic kidney disease with stage 1 through stage 4 chronic kidney disease, or unspecified chronic kidney disease: Secondary | ICD-10-CM | POA: Diagnosis not present

## 2017-07-27 DIAGNOSIS — C7492 Malignant neoplasm of unspecified part of left adrenal gland: Secondary | ICD-10-CM | POA: Diagnosis not present

## 2017-07-27 DIAGNOSIS — I472 Ventricular tachycardia: Secondary | ICD-10-CM | POA: Diagnosis not present

## 2017-07-27 DIAGNOSIS — M19042 Primary osteoarthritis, left hand: Secondary | ICD-10-CM | POA: Diagnosis not present

## 2017-07-27 DIAGNOSIS — M19022 Primary osteoarthritis, left elbow: Secondary | ICD-10-CM | POA: Diagnosis not present

## 2017-07-27 DIAGNOSIS — I2510 Atherosclerotic heart disease of native coronary artery without angina pectoris: Secondary | ICD-10-CM | POA: Diagnosis not present

## 2017-07-27 DIAGNOSIS — N183 Chronic kidney disease, stage 3 (moderate): Secondary | ICD-10-CM | POA: Diagnosis not present

## 2017-07-27 DIAGNOSIS — J9610 Chronic respiratory failure, unspecified whether with hypoxia or hypercapnia: Secondary | ICD-10-CM | POA: Diagnosis not present

## 2017-07-27 DIAGNOSIS — M19012 Primary osteoarthritis, left shoulder: Secondary | ICD-10-CM | POA: Diagnosis not present

## 2017-07-27 DIAGNOSIS — M1612 Unilateral primary osteoarthritis, left hip: Secondary | ICD-10-CM | POA: Diagnosis not present

## 2017-07-27 DIAGNOSIS — I129 Hypertensive chronic kidney disease with stage 1 through stage 4 chronic kidney disease, or unspecified chronic kidney disease: Secondary | ICD-10-CM | POA: Diagnosis not present

## 2017-07-27 DIAGNOSIS — M1712 Unilateral primary osteoarthritis, left knee: Secondary | ICD-10-CM | POA: Diagnosis not present

## 2017-07-30 DIAGNOSIS — I129 Hypertensive chronic kidney disease with stage 1 through stage 4 chronic kidney disease, or unspecified chronic kidney disease: Secondary | ICD-10-CM | POA: Diagnosis not present

## 2017-07-30 DIAGNOSIS — M19042 Primary osteoarthritis, left hand: Secondary | ICD-10-CM | POA: Diagnosis not present

## 2017-07-30 DIAGNOSIS — M19022 Primary osteoarthritis, left elbow: Secondary | ICD-10-CM | POA: Diagnosis not present

## 2017-07-30 DIAGNOSIS — I2510 Atherosclerotic heart disease of native coronary artery without angina pectoris: Secondary | ICD-10-CM | POA: Diagnosis not present

## 2017-07-30 DIAGNOSIS — M19012 Primary osteoarthritis, left shoulder: Secondary | ICD-10-CM | POA: Diagnosis not present

## 2017-07-30 DIAGNOSIS — I472 Ventricular tachycardia: Secondary | ICD-10-CM | POA: Diagnosis not present

## 2017-07-30 DIAGNOSIS — N183 Chronic kidney disease, stage 3 (moderate): Secondary | ICD-10-CM | POA: Diagnosis not present

## 2017-07-30 DIAGNOSIS — M1712 Unilateral primary osteoarthritis, left knee: Secondary | ICD-10-CM | POA: Diagnosis not present

## 2017-07-30 DIAGNOSIS — M1612 Unilateral primary osteoarthritis, left hip: Secondary | ICD-10-CM | POA: Diagnosis not present

## 2017-07-30 DIAGNOSIS — C7492 Malignant neoplasm of unspecified part of left adrenal gland: Secondary | ICD-10-CM | POA: Diagnosis not present

## 2017-07-30 DIAGNOSIS — J9610 Chronic respiratory failure, unspecified whether with hypoxia or hypercapnia: Secondary | ICD-10-CM | POA: Diagnosis not present

## 2017-07-31 DIAGNOSIS — I129 Hypertensive chronic kidney disease with stage 1 through stage 4 chronic kidney disease, or unspecified chronic kidney disease: Secondary | ICD-10-CM | POA: Diagnosis not present

## 2017-07-31 DIAGNOSIS — M19012 Primary osteoarthritis, left shoulder: Secondary | ICD-10-CM | POA: Diagnosis not present

## 2017-07-31 DIAGNOSIS — I2510 Atherosclerotic heart disease of native coronary artery without angina pectoris: Secondary | ICD-10-CM | POA: Diagnosis not present

## 2017-07-31 DIAGNOSIS — N183 Chronic kidney disease, stage 3 (moderate): Secondary | ICD-10-CM | POA: Diagnosis not present

## 2017-07-31 DIAGNOSIS — M1612 Unilateral primary osteoarthritis, left hip: Secondary | ICD-10-CM | POA: Diagnosis not present

## 2017-07-31 DIAGNOSIS — C7492 Malignant neoplasm of unspecified part of left adrenal gland: Secondary | ICD-10-CM | POA: Diagnosis not present

## 2017-07-31 DIAGNOSIS — J9610 Chronic respiratory failure, unspecified whether with hypoxia or hypercapnia: Secondary | ICD-10-CM | POA: Diagnosis not present

## 2017-07-31 DIAGNOSIS — M1712 Unilateral primary osteoarthritis, left knee: Secondary | ICD-10-CM | POA: Diagnosis not present

## 2017-07-31 DIAGNOSIS — M19022 Primary osteoarthritis, left elbow: Secondary | ICD-10-CM | POA: Diagnosis not present

## 2017-07-31 DIAGNOSIS — I472 Ventricular tachycardia: Secondary | ICD-10-CM | POA: Diagnosis not present

## 2017-07-31 DIAGNOSIS — M19042 Primary osteoarthritis, left hand: Secondary | ICD-10-CM | POA: Diagnosis not present

## 2017-08-02 DIAGNOSIS — N183 Chronic kidney disease, stage 3 (moderate): Secondary | ICD-10-CM | POA: Diagnosis not present

## 2017-08-02 DIAGNOSIS — I472 Ventricular tachycardia: Secondary | ICD-10-CM | POA: Diagnosis not present

## 2017-08-02 DIAGNOSIS — I2510 Atherosclerotic heart disease of native coronary artery without angina pectoris: Secondary | ICD-10-CM | POA: Diagnosis not present

## 2017-08-02 DIAGNOSIS — M1712 Unilateral primary osteoarthritis, left knee: Secondary | ICD-10-CM | POA: Diagnosis not present

## 2017-08-02 DIAGNOSIS — M19012 Primary osteoarthritis, left shoulder: Secondary | ICD-10-CM | POA: Diagnosis not present

## 2017-08-02 DIAGNOSIS — J9610 Chronic respiratory failure, unspecified whether with hypoxia or hypercapnia: Secondary | ICD-10-CM | POA: Diagnosis not present

## 2017-08-02 DIAGNOSIS — C7492 Malignant neoplasm of unspecified part of left adrenal gland: Secondary | ICD-10-CM | POA: Diagnosis not present

## 2017-08-02 DIAGNOSIS — M1612 Unilateral primary osteoarthritis, left hip: Secondary | ICD-10-CM | POA: Diagnosis not present

## 2017-08-02 DIAGNOSIS — I129 Hypertensive chronic kidney disease with stage 1 through stage 4 chronic kidney disease, or unspecified chronic kidney disease: Secondary | ICD-10-CM | POA: Diagnosis not present

## 2017-08-02 DIAGNOSIS — M19022 Primary osteoarthritis, left elbow: Secondary | ICD-10-CM | POA: Diagnosis not present

## 2017-08-02 DIAGNOSIS — M19042 Primary osteoarthritis, left hand: Secondary | ICD-10-CM | POA: Diagnosis not present

## 2017-08-06 DIAGNOSIS — M19012 Primary osteoarthritis, left shoulder: Secondary | ICD-10-CM | POA: Diagnosis not present

## 2017-08-06 DIAGNOSIS — I129 Hypertensive chronic kidney disease with stage 1 through stage 4 chronic kidney disease, or unspecified chronic kidney disease: Secondary | ICD-10-CM | POA: Diagnosis not present

## 2017-08-06 DIAGNOSIS — N183 Chronic kidney disease, stage 3 (moderate): Secondary | ICD-10-CM | POA: Diagnosis not present

## 2017-08-06 DIAGNOSIS — I2510 Atherosclerotic heart disease of native coronary artery without angina pectoris: Secondary | ICD-10-CM | POA: Diagnosis not present

## 2017-08-06 DIAGNOSIS — M1612 Unilateral primary osteoarthritis, left hip: Secondary | ICD-10-CM | POA: Diagnosis not present

## 2017-08-06 DIAGNOSIS — M19042 Primary osteoarthritis, left hand: Secondary | ICD-10-CM | POA: Diagnosis not present

## 2017-08-06 DIAGNOSIS — J9610 Chronic respiratory failure, unspecified whether with hypoxia or hypercapnia: Secondary | ICD-10-CM | POA: Diagnosis not present

## 2017-08-06 DIAGNOSIS — C7492 Malignant neoplasm of unspecified part of left adrenal gland: Secondary | ICD-10-CM | POA: Diagnosis not present

## 2017-08-06 DIAGNOSIS — M19022 Primary osteoarthritis, left elbow: Secondary | ICD-10-CM | POA: Diagnosis not present

## 2017-08-06 DIAGNOSIS — I472 Ventricular tachycardia: Secondary | ICD-10-CM | POA: Diagnosis not present

## 2017-08-06 DIAGNOSIS — M1712 Unilateral primary osteoarthritis, left knee: Secondary | ICD-10-CM | POA: Diagnosis not present

## 2017-08-07 DIAGNOSIS — H6123 Impacted cerumen, bilateral: Secondary | ICD-10-CM | POA: Diagnosis not present

## 2017-08-07 DIAGNOSIS — Z1389 Encounter for screening for other disorder: Secondary | ICD-10-CM | POA: Diagnosis not present

## 2017-08-07 DIAGNOSIS — E782 Mixed hyperlipidemia: Secondary | ICD-10-CM | POA: Diagnosis not present

## 2017-08-07 DIAGNOSIS — I10 Essential (primary) hypertension: Secondary | ICD-10-CM | POA: Diagnosis not present

## 2017-08-07 DIAGNOSIS — Z0001 Encounter for general adult medical examination with abnormal findings: Secondary | ICD-10-CM | POA: Diagnosis not present

## 2017-08-13 DIAGNOSIS — I493 Ventricular premature depolarization: Secondary | ICD-10-CM | POA: Diagnosis not present

## 2017-08-13 DIAGNOSIS — I4892 Unspecified atrial flutter: Secondary | ICD-10-CM | POA: Diagnosis not present

## 2017-08-13 DIAGNOSIS — I491 Atrial premature depolarization: Secondary | ICD-10-CM | POA: Diagnosis not present

## 2017-08-13 DIAGNOSIS — Z4509 Encounter for adjustment and management of other cardiac device: Secondary | ICD-10-CM | POA: Diagnosis not present

## 2017-08-13 DIAGNOSIS — I499 Cardiac arrhythmia, unspecified: Secondary | ICD-10-CM | POA: Diagnosis not present

## 2017-08-13 DIAGNOSIS — I480 Paroxysmal atrial fibrillation: Secondary | ICD-10-CM | POA: Diagnosis not present

## 2017-08-13 DIAGNOSIS — R000 Tachycardia, unspecified: Secondary | ICD-10-CM | POA: Diagnosis not present

## 2017-08-13 DIAGNOSIS — I472 Ventricular tachycardia: Secondary | ICD-10-CM | POA: Diagnosis not present

## 2017-08-14 DIAGNOSIS — I129 Hypertensive chronic kidney disease with stage 1 through stage 4 chronic kidney disease, or unspecified chronic kidney disease: Secondary | ICD-10-CM | POA: Diagnosis not present

## 2017-08-14 DIAGNOSIS — M19022 Primary osteoarthritis, left elbow: Secondary | ICD-10-CM | POA: Diagnosis not present

## 2017-08-14 DIAGNOSIS — M19042 Primary osteoarthritis, left hand: Secondary | ICD-10-CM | POA: Diagnosis not present

## 2017-08-14 DIAGNOSIS — M19012 Primary osteoarthritis, left shoulder: Secondary | ICD-10-CM | POA: Diagnosis not present

## 2017-08-14 DIAGNOSIS — I472 Ventricular tachycardia: Secondary | ICD-10-CM | POA: Diagnosis not present

## 2017-08-14 DIAGNOSIS — M1712 Unilateral primary osteoarthritis, left knee: Secondary | ICD-10-CM | POA: Diagnosis not present

## 2017-08-14 DIAGNOSIS — I2510 Atherosclerotic heart disease of native coronary artery without angina pectoris: Secondary | ICD-10-CM | POA: Diagnosis not present

## 2017-08-14 DIAGNOSIS — M1612 Unilateral primary osteoarthritis, left hip: Secondary | ICD-10-CM | POA: Diagnosis not present

## 2017-08-14 DIAGNOSIS — J9610 Chronic respiratory failure, unspecified whether with hypoxia or hypercapnia: Secondary | ICD-10-CM | POA: Diagnosis not present

## 2017-08-14 DIAGNOSIS — C7492 Malignant neoplasm of unspecified part of left adrenal gland: Secondary | ICD-10-CM | POA: Diagnosis not present

## 2017-08-14 DIAGNOSIS — N183 Chronic kidney disease, stage 3 (moderate): Secondary | ICD-10-CM | POA: Diagnosis not present

## 2017-08-15 DIAGNOSIS — Z79899 Other long term (current) drug therapy: Secondary | ICD-10-CM | POA: Diagnosis not present

## 2017-08-15 DIAGNOSIS — N183 Chronic kidney disease, stage 3 (moderate): Secondary | ICD-10-CM | POA: Diagnosis not present

## 2017-08-15 DIAGNOSIS — Z9889 Other specified postprocedural states: Secondary | ICD-10-CM | POA: Diagnosis not present

## 2017-08-15 DIAGNOSIS — Z905 Acquired absence of kidney: Secondary | ICD-10-CM | POA: Diagnosis not present

## 2017-08-15 DIAGNOSIS — Z8674 Personal history of sudden cardiac arrest: Secondary | ICD-10-CM | POA: Diagnosis not present

## 2017-08-15 DIAGNOSIS — M109 Gout, unspecified: Secondary | ICD-10-CM | POA: Diagnosis not present

## 2017-08-15 DIAGNOSIS — I472 Ventricular tachycardia: Secondary | ICD-10-CM | POA: Diagnosis not present

## 2017-08-15 DIAGNOSIS — Z85528 Personal history of other malignant neoplasm of kidney: Secondary | ICD-10-CM | POA: Diagnosis not present

## 2017-08-15 DIAGNOSIS — I129 Hypertensive chronic kidney disease with stage 1 through stage 4 chronic kidney disease, or unspecified chronic kidney disease: Secondary | ICD-10-CM | POA: Diagnosis not present

## 2017-08-16 DIAGNOSIS — M19012 Primary osteoarthritis, left shoulder: Secondary | ICD-10-CM | POA: Diagnosis not present

## 2017-08-16 DIAGNOSIS — C7492 Malignant neoplasm of unspecified part of left adrenal gland: Secondary | ICD-10-CM | POA: Diagnosis not present

## 2017-08-16 DIAGNOSIS — N183 Chronic kidney disease, stage 3 (moderate): Secondary | ICD-10-CM | POA: Diagnosis not present

## 2017-08-16 DIAGNOSIS — M1712 Unilateral primary osteoarthritis, left knee: Secondary | ICD-10-CM | POA: Diagnosis not present

## 2017-08-16 DIAGNOSIS — M1612 Unilateral primary osteoarthritis, left hip: Secondary | ICD-10-CM | POA: Diagnosis not present

## 2017-08-16 DIAGNOSIS — M19022 Primary osteoarthritis, left elbow: Secondary | ICD-10-CM | POA: Diagnosis not present

## 2017-08-16 DIAGNOSIS — I129 Hypertensive chronic kidney disease with stage 1 through stage 4 chronic kidney disease, or unspecified chronic kidney disease: Secondary | ICD-10-CM | POA: Diagnosis not present

## 2017-08-16 DIAGNOSIS — I2510 Atherosclerotic heart disease of native coronary artery without angina pectoris: Secondary | ICD-10-CM | POA: Diagnosis not present

## 2017-08-16 DIAGNOSIS — I472 Ventricular tachycardia: Secondary | ICD-10-CM | POA: Diagnosis not present

## 2017-08-16 DIAGNOSIS — J9610 Chronic respiratory failure, unspecified whether with hypoxia or hypercapnia: Secondary | ICD-10-CM | POA: Diagnosis not present

## 2017-08-16 DIAGNOSIS — M19042 Primary osteoarthritis, left hand: Secondary | ICD-10-CM | POA: Diagnosis not present

## 2017-08-17 DIAGNOSIS — G4730 Sleep apnea, unspecified: Secondary | ICD-10-CM | POA: Diagnosis not present

## 2017-08-18 DIAGNOSIS — G4730 Sleep apnea, unspecified: Secondary | ICD-10-CM | POA: Diagnosis not present

## 2017-08-20 DIAGNOSIS — C7492 Malignant neoplasm of unspecified part of left adrenal gland: Secondary | ICD-10-CM | POA: Diagnosis not present

## 2017-08-20 DIAGNOSIS — M19042 Primary osteoarthritis, left hand: Secondary | ICD-10-CM | POA: Diagnosis not present

## 2017-08-20 DIAGNOSIS — I2510 Atherosclerotic heart disease of native coronary artery without angina pectoris: Secondary | ICD-10-CM | POA: Diagnosis not present

## 2017-08-20 DIAGNOSIS — M19022 Primary osteoarthritis, left elbow: Secondary | ICD-10-CM | POA: Diagnosis not present

## 2017-08-20 DIAGNOSIS — M19012 Primary osteoarthritis, left shoulder: Secondary | ICD-10-CM | POA: Diagnosis not present

## 2017-08-20 DIAGNOSIS — M1612 Unilateral primary osteoarthritis, left hip: Secondary | ICD-10-CM | POA: Diagnosis not present

## 2017-08-20 DIAGNOSIS — I129 Hypertensive chronic kidney disease with stage 1 through stage 4 chronic kidney disease, or unspecified chronic kidney disease: Secondary | ICD-10-CM | POA: Diagnosis not present

## 2017-08-20 DIAGNOSIS — I472 Ventricular tachycardia: Secondary | ICD-10-CM | POA: Diagnosis not present

## 2017-08-20 DIAGNOSIS — J9610 Chronic respiratory failure, unspecified whether with hypoxia or hypercapnia: Secondary | ICD-10-CM | POA: Diagnosis not present

## 2017-08-20 DIAGNOSIS — M1712 Unilateral primary osteoarthritis, left knee: Secondary | ICD-10-CM | POA: Diagnosis not present

## 2017-08-20 DIAGNOSIS — N183 Chronic kidney disease, stage 3 (moderate): Secondary | ICD-10-CM | POA: Diagnosis not present

## 2017-08-21 DIAGNOSIS — I472 Ventricular tachycardia: Secondary | ICD-10-CM | POA: Diagnosis not present

## 2017-08-21 DIAGNOSIS — I129 Hypertensive chronic kidney disease with stage 1 through stage 4 chronic kidney disease, or unspecified chronic kidney disease: Secondary | ICD-10-CM | POA: Diagnosis not present

## 2017-08-21 DIAGNOSIS — J9610 Chronic respiratory failure, unspecified whether with hypoxia or hypercapnia: Secondary | ICD-10-CM | POA: Diagnosis not present

## 2017-08-21 DIAGNOSIS — C7492 Malignant neoplasm of unspecified part of left adrenal gland: Secondary | ICD-10-CM | POA: Diagnosis not present

## 2017-08-21 DIAGNOSIS — M19042 Primary osteoarthritis, left hand: Secondary | ICD-10-CM | POA: Diagnosis not present

## 2017-08-21 DIAGNOSIS — I2510 Atherosclerotic heart disease of native coronary artery without angina pectoris: Secondary | ICD-10-CM | POA: Diagnosis not present

## 2017-08-21 DIAGNOSIS — M1612 Unilateral primary osteoarthritis, left hip: Secondary | ICD-10-CM | POA: Diagnosis not present

## 2017-08-21 DIAGNOSIS — M19022 Primary osteoarthritis, left elbow: Secondary | ICD-10-CM | POA: Diagnosis not present

## 2017-08-21 DIAGNOSIS — M19012 Primary osteoarthritis, left shoulder: Secondary | ICD-10-CM | POA: Diagnosis not present

## 2017-08-21 DIAGNOSIS — N183 Chronic kidney disease, stage 3 (moderate): Secondary | ICD-10-CM | POA: Diagnosis not present

## 2017-08-21 DIAGNOSIS — M1712 Unilateral primary osteoarthritis, left knee: Secondary | ICD-10-CM | POA: Diagnosis not present

## 2017-08-23 DIAGNOSIS — J9610 Chronic respiratory failure, unspecified whether with hypoxia or hypercapnia: Secondary | ICD-10-CM | POA: Diagnosis not present

## 2017-08-23 DIAGNOSIS — I472 Ventricular tachycardia: Secondary | ICD-10-CM | POA: Diagnosis not present

## 2017-08-23 DIAGNOSIS — I129 Hypertensive chronic kidney disease with stage 1 through stage 4 chronic kidney disease, or unspecified chronic kidney disease: Secondary | ICD-10-CM | POA: Diagnosis not present

## 2017-08-23 DIAGNOSIS — M19012 Primary osteoarthritis, left shoulder: Secondary | ICD-10-CM | POA: Diagnosis not present

## 2017-08-23 DIAGNOSIS — C7492 Malignant neoplasm of unspecified part of left adrenal gland: Secondary | ICD-10-CM | POA: Diagnosis not present

## 2017-08-23 DIAGNOSIS — I2510 Atherosclerotic heart disease of native coronary artery without angina pectoris: Secondary | ICD-10-CM | POA: Diagnosis not present

## 2017-08-23 DIAGNOSIS — N183 Chronic kidney disease, stage 3 (moderate): Secondary | ICD-10-CM | POA: Diagnosis not present

## 2017-08-23 DIAGNOSIS — M19022 Primary osteoarthritis, left elbow: Secondary | ICD-10-CM | POA: Diagnosis not present

## 2017-08-23 DIAGNOSIS — M1612 Unilateral primary osteoarthritis, left hip: Secondary | ICD-10-CM | POA: Diagnosis not present

## 2017-08-23 DIAGNOSIS — M19042 Primary osteoarthritis, left hand: Secondary | ICD-10-CM | POA: Diagnosis not present

## 2017-08-23 DIAGNOSIS — M1712 Unilateral primary osteoarthritis, left knee: Secondary | ICD-10-CM | POA: Diagnosis not present

## 2017-09-11 DIAGNOSIS — M1A9XX0 Chronic gout, unspecified, without tophus (tophi): Secondary | ICD-10-CM | POA: Diagnosis not present

## 2017-09-11 DIAGNOSIS — I4510 Unspecified right bundle-branch block: Secondary | ICD-10-CM | POA: Diagnosis not present

## 2017-09-11 DIAGNOSIS — Z85528 Personal history of other malignant neoplasm of kidney: Secondary | ICD-10-CM | POA: Diagnosis not present

## 2017-09-11 DIAGNOSIS — G4733 Obstructive sleep apnea (adult) (pediatric): Secondary | ICD-10-CM | POA: Diagnosis not present

## 2017-09-11 DIAGNOSIS — I10 Essential (primary) hypertension: Secondary | ICD-10-CM | POA: Diagnosis not present

## 2017-09-11 DIAGNOSIS — J9610 Chronic respiratory failure, unspecified whether with hypoxia or hypercapnia: Secondary | ICD-10-CM | POA: Diagnosis not present

## 2017-09-11 DIAGNOSIS — I129 Hypertensive chronic kidney disease with stage 1 through stage 4 chronic kidney disease, or unspecified chronic kidney disease: Secondary | ICD-10-CM | POA: Diagnosis not present

## 2017-09-11 DIAGNOSIS — J9611 Chronic respiratory failure with hypoxia: Secondary | ICD-10-CM | POA: Diagnosis not present

## 2017-09-11 DIAGNOSIS — R410 Disorientation, unspecified: Secondary | ICD-10-CM | POA: Diagnosis not present

## 2017-09-11 DIAGNOSIS — N183 Chronic kidney disease, stage 3 (moderate): Secondary | ICD-10-CM | POA: Diagnosis not present

## 2017-09-11 DIAGNOSIS — Z9119 Patient's noncompliance with other medical treatment and regimen: Secondary | ICD-10-CM | POA: Diagnosis not present

## 2017-09-11 DIAGNOSIS — J9622 Acute and chronic respiratory failure with hypercapnia: Secondary | ICD-10-CM | POA: Diagnosis not present

## 2017-09-11 DIAGNOSIS — Z9981 Dependence on supplemental oxygen: Secondary | ICD-10-CM | POA: Diagnosis not present

## 2017-09-11 DIAGNOSIS — I517 Cardiomegaly: Secondary | ICD-10-CM | POA: Diagnosis not present

## 2017-09-11 DIAGNOSIS — G9341 Metabolic encephalopathy: Secondary | ICD-10-CM | POA: Diagnosis not present

## 2017-09-11 DIAGNOSIS — I1310 Hypertensive heart and chronic kidney disease without heart failure, with stage 1 through stage 4 chronic kidney disease, or unspecified chronic kidney disease: Secondary | ICD-10-CM | POA: Diagnosis not present

## 2017-09-11 DIAGNOSIS — J9621 Acute and chronic respiratory failure with hypoxia: Secondary | ICD-10-CM | POA: Diagnosis not present

## 2017-09-11 DIAGNOSIS — Z79899 Other long term (current) drug therapy: Secondary | ICD-10-CM | POA: Diagnosis not present

## 2017-09-11 DIAGNOSIS — I491 Atrial premature depolarization: Secondary | ICD-10-CM | POA: Diagnosis not present

## 2017-09-11 DIAGNOSIS — Z8674 Personal history of sudden cardiac arrest: Secondary | ICD-10-CM | POA: Diagnosis not present

## 2017-09-11 DIAGNOSIS — J449 Chronic obstructive pulmonary disease, unspecified: Secondary | ICD-10-CM | POA: Diagnosis not present

## 2017-09-11 DIAGNOSIS — R001 Bradycardia, unspecified: Secondary | ICD-10-CM | POA: Diagnosis not present

## 2017-09-11 DIAGNOSIS — Z87891 Personal history of nicotine dependence: Secondary | ICD-10-CM | POA: Diagnosis not present

## 2017-09-11 DIAGNOSIS — E538 Deficiency of other specified B group vitamins: Secondary | ICD-10-CM | POA: Diagnosis not present

## 2017-09-11 DIAGNOSIS — R0989 Other specified symptoms and signs involving the circulatory and respiratory systems: Secondary | ICD-10-CM | POA: Diagnosis not present

## 2017-09-11 DIAGNOSIS — R609 Edema, unspecified: Secondary | ICD-10-CM | POA: Diagnosis not present

## 2017-09-11 DIAGNOSIS — B9710 Unspecified enterovirus as the cause of diseases classified elsewhere: Secondary | ICD-10-CM | POA: Diagnosis not present

## 2017-09-11 DIAGNOSIS — E785 Hyperlipidemia, unspecified: Secondary | ICD-10-CM | POA: Diagnosis not present

## 2017-09-12 DIAGNOSIS — G4733 Obstructive sleep apnea (adult) (pediatric): Secondary | ICD-10-CM | POA: Diagnosis not present

## 2017-09-26 DIAGNOSIS — M1990 Unspecified osteoarthritis, unspecified site: Secondary | ICD-10-CM | POA: Diagnosis not present

## 2017-09-26 DIAGNOSIS — J9621 Acute and chronic respiratory failure with hypoxia: Secondary | ICD-10-CM | POA: Diagnosis not present

## 2017-09-26 DIAGNOSIS — G894 Chronic pain syndrome: Secondary | ICD-10-CM | POA: Diagnosis not present

## 2017-09-26 DIAGNOSIS — N183 Chronic kidney disease, stage 3 (moderate): Secondary | ICD-10-CM | POA: Diagnosis not present

## 2017-09-26 DIAGNOSIS — J9611 Chronic respiratory failure with hypoxia: Secondary | ICD-10-CM | POA: Diagnosis not present

## 2017-09-26 DIAGNOSIS — C641 Malignant neoplasm of right kidney, except renal pelvis: Secondary | ICD-10-CM | POA: Diagnosis not present

## 2017-09-26 DIAGNOSIS — R410 Disorientation, unspecified: Secondary | ICD-10-CM | POA: Diagnosis not present

## 2017-09-26 DIAGNOSIS — I10 Essential (primary) hypertension: Secondary | ICD-10-CM | POA: Diagnosis not present

## 2017-09-26 DIAGNOSIS — R011 Cardiac murmur, unspecified: Secondary | ICD-10-CM | POA: Diagnosis not present

## 2017-09-28 DIAGNOSIS — G4733 Obstructive sleep apnea (adult) (pediatric): Secondary | ICD-10-CM | POA: Diagnosis not present

## 2017-09-28 DIAGNOSIS — I1310 Hypertensive heart and chronic kidney disease without heart failure, with stage 1 through stage 4 chronic kidney disease, or unspecified chronic kidney disease: Secondary | ICD-10-CM | POA: Diagnosis not present

## 2017-09-28 DIAGNOSIS — N183 Chronic kidney disease, stage 3 (moderate): Secondary | ICD-10-CM | POA: Diagnosis not present

## 2017-09-28 DIAGNOSIS — J449 Chronic obstructive pulmonary disease, unspecified: Secondary | ICD-10-CM | POA: Diagnosis not present

## 2017-09-28 DIAGNOSIS — G894 Chronic pain syndrome: Secondary | ICD-10-CM | POA: Diagnosis not present

## 2017-09-28 DIAGNOSIS — M1991 Primary osteoarthritis, unspecified site: Secondary | ICD-10-CM | POA: Diagnosis not present

## 2017-09-28 DIAGNOSIS — J9622 Acute and chronic respiratory failure with hypercapnia: Secondary | ICD-10-CM | POA: Diagnosis not present

## 2017-09-28 DIAGNOSIS — M549 Dorsalgia, unspecified: Secondary | ICD-10-CM | POA: Diagnosis not present

## 2017-09-28 DIAGNOSIS — E538 Deficiency of other specified B group vitamins: Secondary | ICD-10-CM | POA: Diagnosis not present

## 2017-09-28 DIAGNOSIS — E785 Hyperlipidemia, unspecified: Secondary | ICD-10-CM | POA: Diagnosis not present

## 2017-09-28 DIAGNOSIS — M1A9XX0 Chronic gout, unspecified, without tophus (tophi): Secondary | ICD-10-CM | POA: Diagnosis not present

## 2017-09-28 DIAGNOSIS — J9621 Acute and chronic respiratory failure with hypoxia: Secondary | ICD-10-CM | POA: Diagnosis not present

## 2017-10-01 DIAGNOSIS — G894 Chronic pain syndrome: Secondary | ICD-10-CM | POA: Diagnosis not present

## 2017-10-01 DIAGNOSIS — M1A9XX0 Chronic gout, unspecified, without tophus (tophi): Secondary | ICD-10-CM | POA: Diagnosis not present

## 2017-10-01 DIAGNOSIS — N183 Chronic kidney disease, stage 3 (moderate): Secondary | ICD-10-CM | POA: Diagnosis not present

## 2017-10-01 DIAGNOSIS — J449 Chronic obstructive pulmonary disease, unspecified: Secondary | ICD-10-CM | POA: Diagnosis not present

## 2017-10-01 DIAGNOSIS — E785 Hyperlipidemia, unspecified: Secondary | ICD-10-CM | POA: Diagnosis not present

## 2017-10-01 DIAGNOSIS — M1991 Primary osteoarthritis, unspecified site: Secondary | ICD-10-CM | POA: Diagnosis not present

## 2017-10-01 DIAGNOSIS — J9622 Acute and chronic respiratory failure with hypercapnia: Secondary | ICD-10-CM | POA: Diagnosis not present

## 2017-10-01 DIAGNOSIS — E538 Deficiency of other specified B group vitamins: Secondary | ICD-10-CM | POA: Diagnosis not present

## 2017-10-01 DIAGNOSIS — J9621 Acute and chronic respiratory failure with hypoxia: Secondary | ICD-10-CM | POA: Diagnosis not present

## 2017-10-01 DIAGNOSIS — M549 Dorsalgia, unspecified: Secondary | ICD-10-CM | POA: Diagnosis not present

## 2017-10-01 DIAGNOSIS — G4733 Obstructive sleep apnea (adult) (pediatric): Secondary | ICD-10-CM | POA: Diagnosis not present

## 2017-10-01 DIAGNOSIS — I1310 Hypertensive heart and chronic kidney disease without heart failure, with stage 1 through stage 4 chronic kidney disease, or unspecified chronic kidney disease: Secondary | ICD-10-CM | POA: Diagnosis not present

## 2017-10-04 DIAGNOSIS — M1991 Primary osteoarthritis, unspecified site: Secondary | ICD-10-CM | POA: Diagnosis not present

## 2017-10-04 DIAGNOSIS — G894 Chronic pain syndrome: Secondary | ICD-10-CM | POA: Diagnosis not present

## 2017-10-04 DIAGNOSIS — E785 Hyperlipidemia, unspecified: Secondary | ICD-10-CM | POA: Diagnosis not present

## 2017-10-04 DIAGNOSIS — G4733 Obstructive sleep apnea (adult) (pediatric): Secondary | ICD-10-CM | POA: Diagnosis not present

## 2017-10-04 DIAGNOSIS — I1310 Hypertensive heart and chronic kidney disease without heart failure, with stage 1 through stage 4 chronic kidney disease, or unspecified chronic kidney disease: Secondary | ICD-10-CM | POA: Diagnosis not present

## 2017-10-04 DIAGNOSIS — J9621 Acute and chronic respiratory failure with hypoxia: Secondary | ICD-10-CM | POA: Diagnosis not present

## 2017-10-04 DIAGNOSIS — M549 Dorsalgia, unspecified: Secondary | ICD-10-CM | POA: Diagnosis not present

## 2017-10-04 DIAGNOSIS — E538 Deficiency of other specified B group vitamins: Secondary | ICD-10-CM | POA: Diagnosis not present

## 2017-10-04 DIAGNOSIS — J9622 Acute and chronic respiratory failure with hypercapnia: Secondary | ICD-10-CM | POA: Diagnosis not present

## 2017-10-04 DIAGNOSIS — J449 Chronic obstructive pulmonary disease, unspecified: Secondary | ICD-10-CM | POA: Diagnosis not present

## 2017-10-04 DIAGNOSIS — N183 Chronic kidney disease, stage 3 (moderate): Secondary | ICD-10-CM | POA: Diagnosis not present

## 2017-10-04 DIAGNOSIS — M1A9XX0 Chronic gout, unspecified, without tophus (tophi): Secondary | ICD-10-CM | POA: Diagnosis not present

## 2017-10-08 DIAGNOSIS — Z4509 Encounter for adjustment and management of other cardiac device: Secondary | ICD-10-CM | POA: Diagnosis not present

## 2017-10-08 DIAGNOSIS — M1A9XX0 Chronic gout, unspecified, without tophus (tophi): Secondary | ICD-10-CM | POA: Diagnosis not present

## 2017-10-08 DIAGNOSIS — G894 Chronic pain syndrome: Secondary | ICD-10-CM | POA: Diagnosis not present

## 2017-10-08 DIAGNOSIS — N183 Chronic kidney disease, stage 3 (moderate): Secondary | ICD-10-CM | POA: Diagnosis not present

## 2017-10-08 DIAGNOSIS — M549 Dorsalgia, unspecified: Secondary | ICD-10-CM | POA: Diagnosis not present

## 2017-10-08 DIAGNOSIS — J449 Chronic obstructive pulmonary disease, unspecified: Secondary | ICD-10-CM | POA: Diagnosis not present

## 2017-10-08 DIAGNOSIS — G4733 Obstructive sleep apnea (adult) (pediatric): Secondary | ICD-10-CM | POA: Diagnosis not present

## 2017-10-08 DIAGNOSIS — J9621 Acute and chronic respiratory failure with hypoxia: Secondary | ICD-10-CM | POA: Diagnosis not present

## 2017-10-08 DIAGNOSIS — I1310 Hypertensive heart and chronic kidney disease without heart failure, with stage 1 through stage 4 chronic kidney disease, or unspecified chronic kidney disease: Secondary | ICD-10-CM | POA: Diagnosis not present

## 2017-10-08 DIAGNOSIS — E785 Hyperlipidemia, unspecified: Secondary | ICD-10-CM | POA: Diagnosis not present

## 2017-10-08 DIAGNOSIS — J9622 Acute and chronic respiratory failure with hypercapnia: Secondary | ICD-10-CM | POA: Diagnosis not present

## 2017-10-08 DIAGNOSIS — I4892 Unspecified atrial flutter: Secondary | ICD-10-CM | POA: Diagnosis not present

## 2017-10-08 DIAGNOSIS — M1991 Primary osteoarthritis, unspecified site: Secondary | ICD-10-CM | POA: Diagnosis not present

## 2017-10-08 DIAGNOSIS — E538 Deficiency of other specified B group vitamins: Secondary | ICD-10-CM | POA: Diagnosis not present

## 2017-10-09 DIAGNOSIS — G894 Chronic pain syndrome: Secondary | ICD-10-CM | POA: Diagnosis not present

## 2017-10-09 DIAGNOSIS — M1A9XX0 Chronic gout, unspecified, without tophus (tophi): Secondary | ICD-10-CM | POA: Diagnosis not present

## 2017-10-09 DIAGNOSIS — J9622 Acute and chronic respiratory failure with hypercapnia: Secondary | ICD-10-CM | POA: Diagnosis not present

## 2017-10-09 DIAGNOSIS — M549 Dorsalgia, unspecified: Secondary | ICD-10-CM | POA: Diagnosis not present

## 2017-10-09 DIAGNOSIS — G4733 Obstructive sleep apnea (adult) (pediatric): Secondary | ICD-10-CM | POA: Diagnosis not present

## 2017-10-09 DIAGNOSIS — J449 Chronic obstructive pulmonary disease, unspecified: Secondary | ICD-10-CM | POA: Diagnosis not present

## 2017-10-09 DIAGNOSIS — E785 Hyperlipidemia, unspecified: Secondary | ICD-10-CM | POA: Diagnosis not present

## 2017-10-09 DIAGNOSIS — J9621 Acute and chronic respiratory failure with hypoxia: Secondary | ICD-10-CM | POA: Diagnosis not present

## 2017-10-09 DIAGNOSIS — I1310 Hypertensive heart and chronic kidney disease without heart failure, with stage 1 through stage 4 chronic kidney disease, or unspecified chronic kidney disease: Secondary | ICD-10-CM | POA: Diagnosis not present

## 2017-10-09 DIAGNOSIS — E538 Deficiency of other specified B group vitamins: Secondary | ICD-10-CM | POA: Diagnosis not present

## 2017-10-09 DIAGNOSIS — M1991 Primary osteoarthritis, unspecified site: Secondary | ICD-10-CM | POA: Diagnosis not present

## 2017-10-09 DIAGNOSIS — N183 Chronic kidney disease, stage 3 (moderate): Secondary | ICD-10-CM | POA: Diagnosis not present

## 2017-10-10 DIAGNOSIS — J9621 Acute and chronic respiratory failure with hypoxia: Secondary | ICD-10-CM | POA: Diagnosis not present

## 2017-10-10 DIAGNOSIS — M549 Dorsalgia, unspecified: Secondary | ICD-10-CM | POA: Diagnosis not present

## 2017-10-10 DIAGNOSIS — M1991 Primary osteoarthritis, unspecified site: Secondary | ICD-10-CM | POA: Diagnosis not present

## 2017-10-10 DIAGNOSIS — N183 Chronic kidney disease, stage 3 (moderate): Secondary | ICD-10-CM | POA: Diagnosis not present

## 2017-10-10 DIAGNOSIS — G4733 Obstructive sleep apnea (adult) (pediatric): Secondary | ICD-10-CM | POA: Diagnosis not present

## 2017-10-10 DIAGNOSIS — E538 Deficiency of other specified B group vitamins: Secondary | ICD-10-CM | POA: Diagnosis not present

## 2017-10-10 DIAGNOSIS — J9622 Acute and chronic respiratory failure with hypercapnia: Secondary | ICD-10-CM | POA: Diagnosis not present

## 2017-10-10 DIAGNOSIS — E785 Hyperlipidemia, unspecified: Secondary | ICD-10-CM | POA: Diagnosis not present

## 2017-10-10 DIAGNOSIS — M1A9XX0 Chronic gout, unspecified, without tophus (tophi): Secondary | ICD-10-CM | POA: Diagnosis not present

## 2017-10-10 DIAGNOSIS — J449 Chronic obstructive pulmonary disease, unspecified: Secondary | ICD-10-CM | POA: Diagnosis not present

## 2017-10-10 DIAGNOSIS — I1310 Hypertensive heart and chronic kidney disease without heart failure, with stage 1 through stage 4 chronic kidney disease, or unspecified chronic kidney disease: Secondary | ICD-10-CM | POA: Diagnosis not present

## 2017-10-10 DIAGNOSIS — G894 Chronic pain syndrome: Secondary | ICD-10-CM | POA: Diagnosis not present

## 2017-10-11 DIAGNOSIS — J449 Chronic obstructive pulmonary disease, unspecified: Secondary | ICD-10-CM | POA: Diagnosis not present

## 2017-10-11 DIAGNOSIS — G894 Chronic pain syndrome: Secondary | ICD-10-CM | POA: Diagnosis not present

## 2017-10-11 DIAGNOSIS — E785 Hyperlipidemia, unspecified: Secondary | ICD-10-CM | POA: Diagnosis not present

## 2017-10-11 DIAGNOSIS — G4733 Obstructive sleep apnea (adult) (pediatric): Secondary | ICD-10-CM | POA: Diagnosis not present

## 2017-10-11 DIAGNOSIS — M549 Dorsalgia, unspecified: Secondary | ICD-10-CM | POA: Diagnosis not present

## 2017-10-11 DIAGNOSIS — E538 Deficiency of other specified B group vitamins: Secondary | ICD-10-CM | POA: Diagnosis not present

## 2017-10-11 DIAGNOSIS — M1991 Primary osteoarthritis, unspecified site: Secondary | ICD-10-CM | POA: Diagnosis not present

## 2017-10-11 DIAGNOSIS — I1310 Hypertensive heart and chronic kidney disease without heart failure, with stage 1 through stage 4 chronic kidney disease, or unspecified chronic kidney disease: Secondary | ICD-10-CM | POA: Diagnosis not present

## 2017-10-11 DIAGNOSIS — J9621 Acute and chronic respiratory failure with hypoxia: Secondary | ICD-10-CM | POA: Diagnosis not present

## 2017-10-11 DIAGNOSIS — M1A9XX0 Chronic gout, unspecified, without tophus (tophi): Secondary | ICD-10-CM | POA: Diagnosis not present

## 2017-10-11 DIAGNOSIS — J9622 Acute and chronic respiratory failure with hypercapnia: Secondary | ICD-10-CM | POA: Diagnosis not present

## 2017-10-11 DIAGNOSIS — N183 Chronic kidney disease, stage 3 (moderate): Secondary | ICD-10-CM | POA: Diagnosis not present

## 2017-10-12 DIAGNOSIS — J9610 Chronic respiratory failure, unspecified whether with hypoxia or hypercapnia: Secondary | ICD-10-CM | POA: Diagnosis not present

## 2017-10-15 DIAGNOSIS — J449 Chronic obstructive pulmonary disease, unspecified: Secondary | ICD-10-CM | POA: Diagnosis not present

## 2017-10-15 DIAGNOSIS — M549 Dorsalgia, unspecified: Secondary | ICD-10-CM | POA: Diagnosis not present

## 2017-10-15 DIAGNOSIS — M1A9XX0 Chronic gout, unspecified, without tophus (tophi): Secondary | ICD-10-CM | POA: Diagnosis not present

## 2017-10-15 DIAGNOSIS — G894 Chronic pain syndrome: Secondary | ICD-10-CM | POA: Diagnosis not present

## 2017-10-15 DIAGNOSIS — G4733 Obstructive sleep apnea (adult) (pediatric): Secondary | ICD-10-CM | POA: Diagnosis not present

## 2017-10-15 DIAGNOSIS — E538 Deficiency of other specified B group vitamins: Secondary | ICD-10-CM | POA: Diagnosis not present

## 2017-10-15 DIAGNOSIS — M1991 Primary osteoarthritis, unspecified site: Secondary | ICD-10-CM | POA: Diagnosis not present

## 2017-10-15 DIAGNOSIS — N183 Chronic kidney disease, stage 3 (moderate): Secondary | ICD-10-CM | POA: Diagnosis not present

## 2017-10-15 DIAGNOSIS — J9622 Acute and chronic respiratory failure with hypercapnia: Secondary | ICD-10-CM | POA: Diagnosis not present

## 2017-10-15 DIAGNOSIS — J9621 Acute and chronic respiratory failure with hypoxia: Secondary | ICD-10-CM | POA: Diagnosis not present

## 2017-10-15 DIAGNOSIS — I1310 Hypertensive heart and chronic kidney disease without heart failure, with stage 1 through stage 4 chronic kidney disease, or unspecified chronic kidney disease: Secondary | ICD-10-CM | POA: Diagnosis not present

## 2017-10-15 DIAGNOSIS — E785 Hyperlipidemia, unspecified: Secondary | ICD-10-CM | POA: Diagnosis not present

## 2017-10-17 DIAGNOSIS — G894 Chronic pain syndrome: Secondary | ICD-10-CM | POA: Diagnosis not present

## 2017-10-17 DIAGNOSIS — M549 Dorsalgia, unspecified: Secondary | ICD-10-CM | POA: Diagnosis not present

## 2017-10-17 DIAGNOSIS — J9621 Acute and chronic respiratory failure with hypoxia: Secondary | ICD-10-CM | POA: Diagnosis not present

## 2017-10-17 DIAGNOSIS — I1310 Hypertensive heart and chronic kidney disease without heart failure, with stage 1 through stage 4 chronic kidney disease, or unspecified chronic kidney disease: Secondary | ICD-10-CM | POA: Diagnosis not present

## 2017-10-17 DIAGNOSIS — E538 Deficiency of other specified B group vitamins: Secondary | ICD-10-CM | POA: Diagnosis not present

## 2017-10-17 DIAGNOSIS — J9622 Acute and chronic respiratory failure with hypercapnia: Secondary | ICD-10-CM | POA: Diagnosis not present

## 2017-10-17 DIAGNOSIS — M1A9XX0 Chronic gout, unspecified, without tophus (tophi): Secondary | ICD-10-CM | POA: Diagnosis not present

## 2017-10-17 DIAGNOSIS — M1991 Primary osteoarthritis, unspecified site: Secondary | ICD-10-CM | POA: Diagnosis not present

## 2017-10-17 DIAGNOSIS — E785 Hyperlipidemia, unspecified: Secondary | ICD-10-CM | POA: Diagnosis not present

## 2017-10-17 DIAGNOSIS — G4733 Obstructive sleep apnea (adult) (pediatric): Secondary | ICD-10-CM | POA: Diagnosis not present

## 2017-10-17 DIAGNOSIS — N183 Chronic kidney disease, stage 3 (moderate): Secondary | ICD-10-CM | POA: Diagnosis not present

## 2017-10-17 DIAGNOSIS — J449 Chronic obstructive pulmonary disease, unspecified: Secondary | ICD-10-CM | POA: Diagnosis not present

## 2017-10-18 DIAGNOSIS — E538 Deficiency of other specified B group vitamins: Secondary | ICD-10-CM | POA: Diagnosis not present

## 2017-10-18 DIAGNOSIS — G894 Chronic pain syndrome: Secondary | ICD-10-CM | POA: Diagnosis not present

## 2017-10-18 DIAGNOSIS — I1310 Hypertensive heart and chronic kidney disease without heart failure, with stage 1 through stage 4 chronic kidney disease, or unspecified chronic kidney disease: Secondary | ICD-10-CM | POA: Diagnosis not present

## 2017-10-18 DIAGNOSIS — M549 Dorsalgia, unspecified: Secondary | ICD-10-CM | POA: Diagnosis not present

## 2017-10-18 DIAGNOSIS — E785 Hyperlipidemia, unspecified: Secondary | ICD-10-CM | POA: Diagnosis not present

## 2017-10-18 DIAGNOSIS — N183 Chronic kidney disease, stage 3 (moderate): Secondary | ICD-10-CM | POA: Diagnosis not present

## 2017-10-18 DIAGNOSIS — J9622 Acute and chronic respiratory failure with hypercapnia: Secondary | ICD-10-CM | POA: Diagnosis not present

## 2017-10-18 DIAGNOSIS — M1A9XX0 Chronic gout, unspecified, without tophus (tophi): Secondary | ICD-10-CM | POA: Diagnosis not present

## 2017-10-18 DIAGNOSIS — M1991 Primary osteoarthritis, unspecified site: Secondary | ICD-10-CM | POA: Diagnosis not present

## 2017-10-18 DIAGNOSIS — J9621 Acute and chronic respiratory failure with hypoxia: Secondary | ICD-10-CM | POA: Diagnosis not present

## 2017-10-18 DIAGNOSIS — G4733 Obstructive sleep apnea (adult) (pediatric): Secondary | ICD-10-CM | POA: Diagnosis not present

## 2017-10-18 DIAGNOSIS — J449 Chronic obstructive pulmonary disease, unspecified: Secondary | ICD-10-CM | POA: Diagnosis not present

## 2017-10-19 DIAGNOSIS — N183 Chronic kidney disease, stage 3 (moderate): Secondary | ICD-10-CM | POA: Diagnosis not present

## 2017-10-19 DIAGNOSIS — M1991 Primary osteoarthritis, unspecified site: Secondary | ICD-10-CM | POA: Diagnosis not present

## 2017-10-19 DIAGNOSIS — M1A9XX0 Chronic gout, unspecified, without tophus (tophi): Secondary | ICD-10-CM | POA: Diagnosis not present

## 2017-10-19 DIAGNOSIS — G894 Chronic pain syndrome: Secondary | ICD-10-CM | POA: Diagnosis not present

## 2017-10-19 DIAGNOSIS — J9622 Acute and chronic respiratory failure with hypercapnia: Secondary | ICD-10-CM | POA: Diagnosis not present

## 2017-10-19 DIAGNOSIS — J9621 Acute and chronic respiratory failure with hypoxia: Secondary | ICD-10-CM | POA: Diagnosis not present

## 2017-10-19 DIAGNOSIS — E538 Deficiency of other specified B group vitamins: Secondary | ICD-10-CM | POA: Diagnosis not present

## 2017-10-19 DIAGNOSIS — I1310 Hypertensive heart and chronic kidney disease without heart failure, with stage 1 through stage 4 chronic kidney disease, or unspecified chronic kidney disease: Secondary | ICD-10-CM | POA: Diagnosis not present

## 2017-10-19 DIAGNOSIS — E785 Hyperlipidemia, unspecified: Secondary | ICD-10-CM | POA: Diagnosis not present

## 2017-10-19 DIAGNOSIS — J449 Chronic obstructive pulmonary disease, unspecified: Secondary | ICD-10-CM | POA: Diagnosis not present

## 2017-10-19 DIAGNOSIS — G4733 Obstructive sleep apnea (adult) (pediatric): Secondary | ICD-10-CM | POA: Diagnosis not present

## 2017-10-19 DIAGNOSIS — M549 Dorsalgia, unspecified: Secondary | ICD-10-CM | POA: Diagnosis not present

## 2017-10-20 DIAGNOSIS — J9611 Chronic respiratory failure with hypoxia: Secondary | ICD-10-CM | POA: Diagnosis not present

## 2017-10-20 DIAGNOSIS — I4892 Unspecified atrial flutter: Secondary | ICD-10-CM | POA: Diagnosis not present

## 2017-10-20 DIAGNOSIS — Z4509 Encounter for adjustment and management of other cardiac device: Secondary | ICD-10-CM | POA: Diagnosis not present

## 2017-10-20 DIAGNOSIS — G4733 Obstructive sleep apnea (adult) (pediatric): Secondary | ICD-10-CM | POA: Diagnosis not present

## 2017-10-20 DIAGNOSIS — J9621 Acute and chronic respiratory failure with hypoxia: Secondary | ICD-10-CM | POA: Diagnosis not present

## 2017-10-20 DIAGNOSIS — I471 Supraventricular tachycardia: Secondary | ICD-10-CM | POA: Diagnosis not present

## 2017-10-22 DIAGNOSIS — Z8674 Personal history of sudden cardiac arrest: Secondary | ICD-10-CM | POA: Diagnosis not present

## 2017-10-22 DIAGNOSIS — G4737 Central sleep apnea in conditions classified elsewhere: Secondary | ICD-10-CM | POA: Diagnosis not present

## 2017-10-22 DIAGNOSIS — I130 Hypertensive heart and chronic kidney disease with heart failure and stage 1 through stage 4 chronic kidney disease, or unspecified chronic kidney disease: Secondary | ICD-10-CM | POA: Diagnosis not present

## 2017-10-22 DIAGNOSIS — I509 Heart failure, unspecified: Secondary | ICD-10-CM | POA: Diagnosis not present

## 2017-10-22 DIAGNOSIS — Z87891 Personal history of nicotine dependence: Secondary | ICD-10-CM | POA: Diagnosis not present

## 2017-10-22 DIAGNOSIS — I5033 Acute on chronic diastolic (congestive) heart failure: Secondary | ICD-10-CM | POA: Diagnosis not present

## 2017-10-22 DIAGNOSIS — R0989 Other specified symptoms and signs involving the circulatory and respiratory systems: Secondary | ICD-10-CM | POA: Diagnosis not present

## 2017-10-22 DIAGNOSIS — J9621 Acute and chronic respiratory failure with hypoxia: Secondary | ICD-10-CM | POA: Diagnosis not present

## 2017-10-22 DIAGNOSIS — R269 Unspecified abnormalities of gait and mobility: Secondary | ICD-10-CM | POA: Diagnosis not present

## 2017-10-22 DIAGNOSIS — I129 Hypertensive chronic kidney disease with stage 1 through stage 4 chronic kidney disease, or unspecified chronic kidney disease: Secondary | ICD-10-CM | POA: Diagnosis not present

## 2017-10-22 DIAGNOSIS — J90 Pleural effusion, not elsewhere classified: Secondary | ICD-10-CM | POA: Diagnosis not present

## 2017-10-22 DIAGNOSIS — R0902 Hypoxemia: Secondary | ICD-10-CM | POA: Diagnosis not present

## 2017-10-22 DIAGNOSIS — Z8249 Family history of ischemic heart disease and other diseases of the circulatory system: Secondary | ICD-10-CM | POA: Diagnosis not present

## 2017-10-22 DIAGNOSIS — E538 Deficiency of other specified B group vitamins: Secondary | ICD-10-CM | POA: Diagnosis not present

## 2017-10-22 DIAGNOSIS — J9622 Acute and chronic respiratory failure with hypercapnia: Secondary | ICD-10-CM | POA: Diagnosis not present

## 2017-10-22 DIAGNOSIS — Z905 Acquired absence of kidney: Secondary | ICD-10-CM | POA: Diagnosis not present

## 2017-10-22 DIAGNOSIS — K219 Gastro-esophageal reflux disease without esophagitis: Secondary | ICD-10-CM | POA: Diagnosis not present

## 2017-10-22 DIAGNOSIS — I10 Essential (primary) hypertension: Secondary | ICD-10-CM | POA: Diagnosis not present

## 2017-10-22 DIAGNOSIS — Z9981 Dependence on supplemental oxygen: Secondary | ICD-10-CM | POA: Diagnosis not present

## 2017-10-22 DIAGNOSIS — Z9989 Dependence on other enabling machines and devices: Secondary | ICD-10-CM | POA: Diagnosis not present

## 2017-10-22 DIAGNOSIS — N183 Chronic kidney disease, stage 3 (moderate): Secondary | ICD-10-CM | POA: Diagnosis not present

## 2017-10-22 DIAGNOSIS — R918 Other nonspecific abnormal finding of lung field: Secondary | ICD-10-CM | POA: Diagnosis not present

## 2017-10-22 DIAGNOSIS — G8929 Other chronic pain: Secondary | ICD-10-CM | POA: Diagnosis not present

## 2017-10-22 DIAGNOSIS — Z85528 Personal history of other malignant neoplasm of kidney: Secondary | ICD-10-CM | POA: Diagnosis not present

## 2017-10-22 DIAGNOSIS — J9611 Chronic respiratory failure with hypoxia: Secondary | ICD-10-CM | POA: Diagnosis not present

## 2017-10-22 DIAGNOSIS — R069 Unspecified abnormalities of breathing: Secondary | ICD-10-CM | POA: Diagnosis not present

## 2017-10-22 DIAGNOSIS — Z9119 Patient's noncompliance with other medical treatment and regimen: Secondary | ICD-10-CM | POA: Diagnosis not present

## 2017-10-22 DIAGNOSIS — G4733 Obstructive sleep apnea (adult) (pediatric): Secondary | ICD-10-CM | POA: Diagnosis not present

## 2017-10-22 DIAGNOSIS — J449 Chronic obstructive pulmonary disease, unspecified: Secondary | ICD-10-CM | POA: Diagnosis not present

## 2017-10-22 DIAGNOSIS — Z7982 Long term (current) use of aspirin: Secondary | ICD-10-CM | POA: Diagnosis not present

## 2017-10-22 DIAGNOSIS — Z79899 Other long term (current) drug therapy: Secondary | ICD-10-CM | POA: Diagnosis not present

## 2017-10-22 DIAGNOSIS — E8770 Fluid overload, unspecified: Secondary | ICD-10-CM | POA: Diagnosis not present

## 2017-10-22 DIAGNOSIS — M109 Gout, unspecified: Secondary | ICD-10-CM | POA: Diagnosis not present

## 2017-10-22 DIAGNOSIS — R079 Chest pain, unspecified: Secondary | ICD-10-CM | POA: Diagnosis not present

## 2017-10-22 DIAGNOSIS — R0602 Shortness of breath: Secondary | ICD-10-CM | POA: Diagnosis not present

## 2017-10-22 DIAGNOSIS — E875 Hyperkalemia: Secondary | ICD-10-CM | POA: Diagnosis not present

## 2017-10-22 DIAGNOSIS — I4510 Unspecified right bundle-branch block: Secondary | ICD-10-CM | POA: Diagnosis not present

## 2017-10-22 DIAGNOSIS — E785 Hyperlipidemia, unspecified: Secondary | ICD-10-CM | POA: Diagnosis not present

## 2017-11-02 DIAGNOSIS — M1A9XX0 Chronic gout, unspecified, without tophus (tophi): Secondary | ICD-10-CM | POA: Diagnosis not present

## 2017-11-02 DIAGNOSIS — J9621 Acute and chronic respiratory failure with hypoxia: Secondary | ICD-10-CM | POA: Diagnosis not present

## 2017-11-02 DIAGNOSIS — J449 Chronic obstructive pulmonary disease, unspecified: Secondary | ICD-10-CM | POA: Diagnosis not present

## 2017-11-02 DIAGNOSIS — M549 Dorsalgia, unspecified: Secondary | ICD-10-CM | POA: Diagnosis not present

## 2017-11-02 DIAGNOSIS — N183 Chronic kidney disease, stage 3 (moderate): Secondary | ICD-10-CM | POA: Diagnosis not present

## 2017-11-02 DIAGNOSIS — E538 Deficiency of other specified B group vitamins: Secondary | ICD-10-CM | POA: Diagnosis not present

## 2017-11-02 DIAGNOSIS — G4733 Obstructive sleep apnea (adult) (pediatric): Secondary | ICD-10-CM | POA: Diagnosis not present

## 2017-11-02 DIAGNOSIS — I1310 Hypertensive heart and chronic kidney disease without heart failure, with stage 1 through stage 4 chronic kidney disease, or unspecified chronic kidney disease: Secondary | ICD-10-CM | POA: Diagnosis not present

## 2017-11-02 DIAGNOSIS — E785 Hyperlipidemia, unspecified: Secondary | ICD-10-CM | POA: Diagnosis not present

## 2017-11-02 DIAGNOSIS — G894 Chronic pain syndrome: Secondary | ICD-10-CM | POA: Diagnosis not present

## 2017-11-02 DIAGNOSIS — J9622 Acute and chronic respiratory failure with hypercapnia: Secondary | ICD-10-CM | POA: Diagnosis not present

## 2017-11-02 DIAGNOSIS — M1991 Primary osteoarthritis, unspecified site: Secondary | ICD-10-CM | POA: Diagnosis not present

## 2017-11-05 DIAGNOSIS — R600 Localized edema: Secondary | ICD-10-CM | POA: Diagnosis not present

## 2017-11-05 DIAGNOSIS — Z1389 Encounter for screening for other disorder: Secondary | ICD-10-CM | POA: Diagnosis not present

## 2017-11-05 DIAGNOSIS — J9620 Acute and chronic respiratory failure, unspecified whether with hypoxia or hypercapnia: Secondary | ICD-10-CM | POA: Diagnosis not present

## 2017-11-05 DIAGNOSIS — M1991 Primary osteoarthritis, unspecified site: Secondary | ICD-10-CM | POA: Diagnosis not present

## 2017-11-05 DIAGNOSIS — G4730 Sleep apnea, unspecified: Secondary | ICD-10-CM | POA: Diagnosis not present

## 2017-11-05 DIAGNOSIS — N183 Chronic kidney disease, stage 3 (moderate): Secondary | ICD-10-CM | POA: Diagnosis not present

## 2017-11-05 DIAGNOSIS — H509 Unspecified strabismus: Secondary | ICD-10-CM | POA: Diagnosis not present

## 2017-11-07 DIAGNOSIS — E785 Hyperlipidemia, unspecified: Secondary | ICD-10-CM | POA: Diagnosis not present

## 2017-11-07 DIAGNOSIS — G4733 Obstructive sleep apnea (adult) (pediatric): Secondary | ICD-10-CM | POA: Diagnosis not present

## 2017-11-07 DIAGNOSIS — E538 Deficiency of other specified B group vitamins: Secondary | ICD-10-CM | POA: Diagnosis not present

## 2017-11-07 DIAGNOSIS — M549 Dorsalgia, unspecified: Secondary | ICD-10-CM | POA: Diagnosis not present

## 2017-11-07 DIAGNOSIS — N183 Chronic kidney disease, stage 3 (moderate): Secondary | ICD-10-CM | POA: Diagnosis not present

## 2017-11-07 DIAGNOSIS — M1991 Primary osteoarthritis, unspecified site: Secondary | ICD-10-CM | POA: Diagnosis not present

## 2017-11-07 DIAGNOSIS — M1A9XX0 Chronic gout, unspecified, without tophus (tophi): Secondary | ICD-10-CM | POA: Diagnosis not present

## 2017-11-07 DIAGNOSIS — I1310 Hypertensive heart and chronic kidney disease without heart failure, with stage 1 through stage 4 chronic kidney disease, or unspecified chronic kidney disease: Secondary | ICD-10-CM | POA: Diagnosis not present

## 2017-11-07 DIAGNOSIS — J9621 Acute and chronic respiratory failure with hypoxia: Secondary | ICD-10-CM | POA: Diagnosis not present

## 2017-11-07 DIAGNOSIS — J9622 Acute and chronic respiratory failure with hypercapnia: Secondary | ICD-10-CM | POA: Diagnosis not present

## 2017-11-07 DIAGNOSIS — G894 Chronic pain syndrome: Secondary | ICD-10-CM | POA: Diagnosis not present

## 2017-11-07 DIAGNOSIS — J449 Chronic obstructive pulmonary disease, unspecified: Secondary | ICD-10-CM | POA: Diagnosis not present

## 2017-11-08 DIAGNOSIS — I1310 Hypertensive heart and chronic kidney disease without heart failure, with stage 1 through stage 4 chronic kidney disease, or unspecified chronic kidney disease: Secondary | ICD-10-CM | POA: Diagnosis not present

## 2017-11-08 DIAGNOSIS — M549 Dorsalgia, unspecified: Secondary | ICD-10-CM | POA: Diagnosis not present

## 2017-11-08 DIAGNOSIS — N183 Chronic kidney disease, stage 3 (moderate): Secondary | ICD-10-CM | POA: Diagnosis not present

## 2017-11-08 DIAGNOSIS — J449 Chronic obstructive pulmonary disease, unspecified: Secondary | ICD-10-CM | POA: Diagnosis not present

## 2017-11-08 DIAGNOSIS — J9621 Acute and chronic respiratory failure with hypoxia: Secondary | ICD-10-CM | POA: Diagnosis not present

## 2017-11-08 DIAGNOSIS — J9622 Acute and chronic respiratory failure with hypercapnia: Secondary | ICD-10-CM | POA: Diagnosis not present

## 2017-11-08 DIAGNOSIS — G4733 Obstructive sleep apnea (adult) (pediatric): Secondary | ICD-10-CM | POA: Diagnosis not present

## 2017-11-08 DIAGNOSIS — G894 Chronic pain syndrome: Secondary | ICD-10-CM | POA: Diagnosis not present

## 2017-11-08 DIAGNOSIS — E785 Hyperlipidemia, unspecified: Secondary | ICD-10-CM | POA: Diagnosis not present

## 2017-11-08 DIAGNOSIS — M1991 Primary osteoarthritis, unspecified site: Secondary | ICD-10-CM | POA: Diagnosis not present

## 2017-11-08 DIAGNOSIS — E538 Deficiency of other specified B group vitamins: Secondary | ICD-10-CM | POA: Diagnosis not present

## 2017-11-08 DIAGNOSIS — M1A9XX0 Chronic gout, unspecified, without tophus (tophi): Secondary | ICD-10-CM | POA: Diagnosis not present

## 2017-11-09 DIAGNOSIS — M549 Dorsalgia, unspecified: Secondary | ICD-10-CM | POA: Diagnosis not present

## 2017-11-09 DIAGNOSIS — G4733 Obstructive sleep apnea (adult) (pediatric): Secondary | ICD-10-CM | POA: Diagnosis not present

## 2017-11-09 DIAGNOSIS — E785 Hyperlipidemia, unspecified: Secondary | ICD-10-CM | POA: Diagnosis not present

## 2017-11-09 DIAGNOSIS — J9622 Acute and chronic respiratory failure with hypercapnia: Secondary | ICD-10-CM | POA: Diagnosis not present

## 2017-11-09 DIAGNOSIS — J9621 Acute and chronic respiratory failure with hypoxia: Secondary | ICD-10-CM | POA: Diagnosis not present

## 2017-11-09 DIAGNOSIS — E538 Deficiency of other specified B group vitamins: Secondary | ICD-10-CM | POA: Diagnosis not present

## 2017-11-09 DIAGNOSIS — M1A9XX0 Chronic gout, unspecified, without tophus (tophi): Secondary | ICD-10-CM | POA: Diagnosis not present

## 2017-11-09 DIAGNOSIS — I1310 Hypertensive heart and chronic kidney disease without heart failure, with stage 1 through stage 4 chronic kidney disease, or unspecified chronic kidney disease: Secondary | ICD-10-CM | POA: Diagnosis not present

## 2017-11-09 DIAGNOSIS — G894 Chronic pain syndrome: Secondary | ICD-10-CM | POA: Diagnosis not present

## 2017-11-09 DIAGNOSIS — J449 Chronic obstructive pulmonary disease, unspecified: Secondary | ICD-10-CM | POA: Diagnosis not present

## 2017-11-09 DIAGNOSIS — M1991 Primary osteoarthritis, unspecified site: Secondary | ICD-10-CM | POA: Diagnosis not present

## 2017-11-09 DIAGNOSIS — N183 Chronic kidney disease, stage 3 (moderate): Secondary | ICD-10-CM | POA: Diagnosis not present

## 2017-11-12 DIAGNOSIS — I1310 Hypertensive heart and chronic kidney disease without heart failure, with stage 1 through stage 4 chronic kidney disease, or unspecified chronic kidney disease: Secondary | ICD-10-CM | POA: Diagnosis not present

## 2017-11-12 DIAGNOSIS — J9622 Acute and chronic respiratory failure with hypercapnia: Secondary | ICD-10-CM | POA: Diagnosis not present

## 2017-11-12 DIAGNOSIS — M549 Dorsalgia, unspecified: Secondary | ICD-10-CM | POA: Diagnosis not present

## 2017-11-12 DIAGNOSIS — N183 Chronic kidney disease, stage 3 (moderate): Secondary | ICD-10-CM | POA: Diagnosis not present

## 2017-11-12 DIAGNOSIS — G894 Chronic pain syndrome: Secondary | ICD-10-CM | POA: Diagnosis not present

## 2017-11-12 DIAGNOSIS — J9621 Acute and chronic respiratory failure with hypoxia: Secondary | ICD-10-CM | POA: Diagnosis not present

## 2017-11-12 DIAGNOSIS — E538 Deficiency of other specified B group vitamins: Secondary | ICD-10-CM | POA: Diagnosis not present

## 2017-11-12 DIAGNOSIS — E785 Hyperlipidemia, unspecified: Secondary | ICD-10-CM | POA: Diagnosis not present

## 2017-11-12 DIAGNOSIS — G4733 Obstructive sleep apnea (adult) (pediatric): Secondary | ICD-10-CM | POA: Diagnosis not present

## 2017-11-12 DIAGNOSIS — J449 Chronic obstructive pulmonary disease, unspecified: Secondary | ICD-10-CM | POA: Diagnosis not present

## 2017-11-12 DIAGNOSIS — M1A9XX0 Chronic gout, unspecified, without tophus (tophi): Secondary | ICD-10-CM | POA: Diagnosis not present

## 2017-11-12 DIAGNOSIS — J9610 Chronic respiratory failure, unspecified whether with hypoxia or hypercapnia: Secondary | ICD-10-CM | POA: Diagnosis not present

## 2017-11-12 DIAGNOSIS — M1991 Primary osteoarthritis, unspecified site: Secondary | ICD-10-CM | POA: Diagnosis not present

## 2017-11-14 DIAGNOSIS — J449 Chronic obstructive pulmonary disease, unspecified: Secondary | ICD-10-CM | POA: Diagnosis not present

## 2017-11-14 DIAGNOSIS — M549 Dorsalgia, unspecified: Secondary | ICD-10-CM | POA: Diagnosis not present

## 2017-11-14 DIAGNOSIS — M1A9XX0 Chronic gout, unspecified, without tophus (tophi): Secondary | ICD-10-CM | POA: Diagnosis not present

## 2017-11-14 DIAGNOSIS — G4733 Obstructive sleep apnea (adult) (pediatric): Secondary | ICD-10-CM | POA: Diagnosis not present

## 2017-11-14 DIAGNOSIS — I1310 Hypertensive heart and chronic kidney disease without heart failure, with stage 1 through stage 4 chronic kidney disease, or unspecified chronic kidney disease: Secondary | ICD-10-CM | POA: Diagnosis not present

## 2017-11-14 DIAGNOSIS — G894 Chronic pain syndrome: Secondary | ICD-10-CM | POA: Diagnosis not present

## 2017-11-14 DIAGNOSIS — E538 Deficiency of other specified B group vitamins: Secondary | ICD-10-CM | POA: Diagnosis not present

## 2017-11-14 DIAGNOSIS — J9621 Acute and chronic respiratory failure with hypoxia: Secondary | ICD-10-CM | POA: Diagnosis not present

## 2017-11-14 DIAGNOSIS — M1991 Primary osteoarthritis, unspecified site: Secondary | ICD-10-CM | POA: Diagnosis not present

## 2017-11-14 DIAGNOSIS — N183 Chronic kidney disease, stage 3 (moderate): Secondary | ICD-10-CM | POA: Diagnosis not present

## 2017-11-14 DIAGNOSIS — J9622 Acute and chronic respiratory failure with hypercapnia: Secondary | ICD-10-CM | POA: Diagnosis not present

## 2017-11-14 DIAGNOSIS — E785 Hyperlipidemia, unspecified: Secondary | ICD-10-CM | POA: Diagnosis not present

## 2017-11-15 DIAGNOSIS — M549 Dorsalgia, unspecified: Secondary | ICD-10-CM | POA: Diagnosis not present

## 2017-11-15 DIAGNOSIS — R801 Persistent proteinuria, unspecified: Secondary | ICD-10-CM | POA: Diagnosis not present

## 2017-11-15 DIAGNOSIS — I1310 Hypertensive heart and chronic kidney disease without heart failure, with stage 1 through stage 4 chronic kidney disease, or unspecified chronic kidney disease: Secondary | ICD-10-CM | POA: Diagnosis not present

## 2017-11-15 DIAGNOSIS — Z886 Allergy status to analgesic agent status: Secondary | ICD-10-CM | POA: Diagnosis not present

## 2017-11-15 DIAGNOSIS — D631 Anemia in chronic kidney disease: Secondary | ICD-10-CM | POA: Diagnosis not present

## 2017-11-15 DIAGNOSIS — E559 Vitamin D deficiency, unspecified: Secondary | ICD-10-CM | POA: Diagnosis not present

## 2017-11-15 DIAGNOSIS — I129 Hypertensive chronic kidney disease with stage 1 through stage 4 chronic kidney disease, or unspecified chronic kidney disease: Secondary | ICD-10-CM | POA: Diagnosis not present

## 2017-11-15 DIAGNOSIS — J9622 Acute and chronic respiratory failure with hypercapnia: Secondary | ICD-10-CM | POA: Diagnosis not present

## 2017-11-15 DIAGNOSIS — Z7982 Long term (current) use of aspirin: Secondary | ICD-10-CM | POA: Diagnosis not present

## 2017-11-15 DIAGNOSIS — Z8674 Personal history of sudden cardiac arrest: Secondary | ICD-10-CM | POA: Diagnosis not present

## 2017-11-15 DIAGNOSIS — Z888 Allergy status to other drugs, medicaments and biological substances status: Secondary | ICD-10-CM | POA: Diagnosis not present

## 2017-11-15 DIAGNOSIS — J449 Chronic obstructive pulmonary disease, unspecified: Secondary | ICD-10-CM | POA: Diagnosis not present

## 2017-11-15 DIAGNOSIS — G4733 Obstructive sleep apnea (adult) (pediatric): Secondary | ICD-10-CM | POA: Diagnosis not present

## 2017-11-15 DIAGNOSIS — J9621 Acute and chronic respiratory failure with hypoxia: Secondary | ICD-10-CM | POA: Diagnosis not present

## 2017-11-15 DIAGNOSIS — I472 Ventricular tachycardia: Secondary | ICD-10-CM | POA: Diagnosis not present

## 2017-11-15 DIAGNOSIS — Z85528 Personal history of other malignant neoplasm of kidney: Secondary | ICD-10-CM | POA: Diagnosis not present

## 2017-11-15 DIAGNOSIS — Z905 Acquired absence of kidney: Secondary | ICD-10-CM | POA: Diagnosis not present

## 2017-11-15 DIAGNOSIS — R809 Proteinuria, unspecified: Secondary | ICD-10-CM | POA: Diagnosis not present

## 2017-11-15 DIAGNOSIS — E785 Hyperlipidemia, unspecified: Secondary | ICD-10-CM | POA: Diagnosis not present

## 2017-11-15 DIAGNOSIS — G894 Chronic pain syndrome: Secondary | ICD-10-CM | POA: Diagnosis not present

## 2017-11-15 DIAGNOSIS — Z880 Allergy status to penicillin: Secondary | ICD-10-CM | POA: Diagnosis not present

## 2017-11-15 DIAGNOSIS — Z79899 Other long term (current) drug therapy: Secondary | ICD-10-CM | POA: Diagnosis not present

## 2017-11-15 DIAGNOSIS — M1A9XX0 Chronic gout, unspecified, without tophus (tophi): Secondary | ICD-10-CM | POA: Diagnosis not present

## 2017-11-15 DIAGNOSIS — Z9889 Other specified postprocedural states: Secondary | ICD-10-CM | POA: Diagnosis not present

## 2017-11-15 DIAGNOSIS — M109 Gout, unspecified: Secondary | ICD-10-CM | POA: Diagnosis not present

## 2017-11-15 DIAGNOSIS — N183 Chronic kidney disease, stage 3 (moderate): Secondary | ICD-10-CM | POA: Diagnosis not present

## 2017-11-15 DIAGNOSIS — Z885 Allergy status to narcotic agent status: Secondary | ICD-10-CM | POA: Diagnosis not present

## 2017-11-15 DIAGNOSIS — M1991 Primary osteoarthritis, unspecified site: Secondary | ICD-10-CM | POA: Diagnosis not present

## 2017-11-15 DIAGNOSIS — E538 Deficiency of other specified B group vitamins: Secondary | ICD-10-CM | POA: Diagnosis not present

## 2017-11-19 DIAGNOSIS — I1310 Hypertensive heart and chronic kidney disease without heart failure, with stage 1 through stage 4 chronic kidney disease, or unspecified chronic kidney disease: Secondary | ICD-10-CM | POA: Diagnosis not present

## 2017-11-19 DIAGNOSIS — G4733 Obstructive sleep apnea (adult) (pediatric): Secondary | ICD-10-CM | POA: Diagnosis not present

## 2017-11-19 DIAGNOSIS — G894 Chronic pain syndrome: Secondary | ICD-10-CM | POA: Diagnosis not present

## 2017-11-19 DIAGNOSIS — E538 Deficiency of other specified B group vitamins: Secondary | ICD-10-CM | POA: Diagnosis not present

## 2017-11-19 DIAGNOSIS — M1A9XX0 Chronic gout, unspecified, without tophus (tophi): Secondary | ICD-10-CM | POA: Diagnosis not present

## 2017-11-19 DIAGNOSIS — M549 Dorsalgia, unspecified: Secondary | ICD-10-CM | POA: Diagnosis not present

## 2017-11-19 DIAGNOSIS — J9621 Acute and chronic respiratory failure with hypoxia: Secondary | ICD-10-CM | POA: Diagnosis not present

## 2017-11-19 DIAGNOSIS — N183 Chronic kidney disease, stage 3 (moderate): Secondary | ICD-10-CM | POA: Diagnosis not present

## 2017-11-19 DIAGNOSIS — J449 Chronic obstructive pulmonary disease, unspecified: Secondary | ICD-10-CM | POA: Diagnosis not present

## 2017-11-19 DIAGNOSIS — E785 Hyperlipidemia, unspecified: Secondary | ICD-10-CM | POA: Diagnosis not present

## 2017-11-19 DIAGNOSIS — J9622 Acute and chronic respiratory failure with hypercapnia: Secondary | ICD-10-CM | POA: Diagnosis not present

## 2017-11-19 DIAGNOSIS — M1991 Primary osteoarthritis, unspecified site: Secondary | ICD-10-CM | POA: Diagnosis not present

## 2017-11-20 DIAGNOSIS — I1310 Hypertensive heart and chronic kidney disease without heart failure, with stage 1 through stage 4 chronic kidney disease, or unspecified chronic kidney disease: Secondary | ICD-10-CM | POA: Diagnosis not present

## 2017-11-20 DIAGNOSIS — J9621 Acute and chronic respiratory failure with hypoxia: Secondary | ICD-10-CM | POA: Diagnosis not present

## 2017-11-20 DIAGNOSIS — J449 Chronic obstructive pulmonary disease, unspecified: Secondary | ICD-10-CM | POA: Diagnosis not present

## 2017-11-20 DIAGNOSIS — N183 Chronic kidney disease, stage 3 (moderate): Secondary | ICD-10-CM | POA: Diagnosis not present

## 2017-11-20 DIAGNOSIS — G894 Chronic pain syndrome: Secondary | ICD-10-CM | POA: Diagnosis not present

## 2017-11-20 DIAGNOSIS — M549 Dorsalgia, unspecified: Secondary | ICD-10-CM | POA: Diagnosis not present

## 2017-11-20 DIAGNOSIS — G4733 Obstructive sleep apnea (adult) (pediatric): Secondary | ICD-10-CM | POA: Diagnosis not present

## 2017-11-20 DIAGNOSIS — J9622 Acute and chronic respiratory failure with hypercapnia: Secondary | ICD-10-CM | POA: Diagnosis not present

## 2017-11-20 DIAGNOSIS — E785 Hyperlipidemia, unspecified: Secondary | ICD-10-CM | POA: Diagnosis not present

## 2017-11-20 DIAGNOSIS — J9611 Chronic respiratory failure with hypoxia: Secondary | ICD-10-CM | POA: Diagnosis not present

## 2017-11-20 DIAGNOSIS — M1991 Primary osteoarthritis, unspecified site: Secondary | ICD-10-CM | POA: Diagnosis not present

## 2017-11-20 DIAGNOSIS — E538 Deficiency of other specified B group vitamins: Secondary | ICD-10-CM | POA: Diagnosis not present

## 2017-11-20 DIAGNOSIS — M1A9XX0 Chronic gout, unspecified, without tophus (tophi): Secondary | ICD-10-CM | POA: Diagnosis not present

## 2017-11-21 DIAGNOSIS — I4510 Unspecified right bundle-branch block: Secondary | ICD-10-CM | POA: Diagnosis not present

## 2017-11-21 DIAGNOSIS — I472 Ventricular tachycardia: Secondary | ICD-10-CM | POA: Diagnosis not present

## 2017-11-21 DIAGNOSIS — Z880 Allergy status to penicillin: Secondary | ICD-10-CM | POA: Diagnosis not present

## 2017-11-21 DIAGNOSIS — Z884 Allergy status to anesthetic agent status: Secondary | ICD-10-CM | POA: Diagnosis not present

## 2017-11-21 DIAGNOSIS — I499 Cardiac arrhythmia, unspecified: Secondary | ICD-10-CM | POA: Diagnosis not present

## 2017-11-21 DIAGNOSIS — Z885 Allergy status to narcotic agent status: Secondary | ICD-10-CM | POA: Diagnosis not present

## 2017-11-21 DIAGNOSIS — Z886 Allergy status to analgesic agent status: Secondary | ICD-10-CM | POA: Diagnosis not present

## 2017-11-21 DIAGNOSIS — R9431 Abnormal electrocardiogram [ECG] [EKG]: Secondary | ICD-10-CM | POA: Diagnosis not present

## 2017-11-21 DIAGNOSIS — R0602 Shortness of breath: Secondary | ICD-10-CM | POA: Diagnosis not present

## 2017-11-21 DIAGNOSIS — I4519 Other right bundle-branch block: Secondary | ICD-10-CM | POA: Diagnosis not present

## 2017-11-22 DIAGNOSIS — J9621 Acute and chronic respiratory failure with hypoxia: Secondary | ICD-10-CM | POA: Diagnosis not present

## 2017-11-22 DIAGNOSIS — G4733 Obstructive sleep apnea (adult) (pediatric): Secondary | ICD-10-CM | POA: Diagnosis not present

## 2017-11-22 DIAGNOSIS — E785 Hyperlipidemia, unspecified: Secondary | ICD-10-CM | POA: Diagnosis not present

## 2017-11-22 DIAGNOSIS — M549 Dorsalgia, unspecified: Secondary | ICD-10-CM | POA: Diagnosis not present

## 2017-11-22 DIAGNOSIS — J449 Chronic obstructive pulmonary disease, unspecified: Secondary | ICD-10-CM | POA: Diagnosis not present

## 2017-11-22 DIAGNOSIS — J9622 Acute and chronic respiratory failure with hypercapnia: Secondary | ICD-10-CM | POA: Diagnosis not present

## 2017-11-22 DIAGNOSIS — M1A9XX0 Chronic gout, unspecified, without tophus (tophi): Secondary | ICD-10-CM | POA: Diagnosis not present

## 2017-11-22 DIAGNOSIS — M1991 Primary osteoarthritis, unspecified site: Secondary | ICD-10-CM | POA: Diagnosis not present

## 2017-11-22 DIAGNOSIS — N183 Chronic kidney disease, stage 3 (moderate): Secondary | ICD-10-CM | POA: Diagnosis not present

## 2017-11-22 DIAGNOSIS — E538 Deficiency of other specified B group vitamins: Secondary | ICD-10-CM | POA: Diagnosis not present

## 2017-11-22 DIAGNOSIS — G894 Chronic pain syndrome: Secondary | ICD-10-CM | POA: Diagnosis not present

## 2017-11-22 DIAGNOSIS — I1310 Hypertensive heart and chronic kidney disease without heart failure, with stage 1 through stage 4 chronic kidney disease, or unspecified chronic kidney disease: Secondary | ICD-10-CM | POA: Diagnosis not present

## 2017-11-27 DIAGNOSIS — I959 Hypotension, unspecified: Secondary | ICD-10-CM | POA: Diagnosis not present

## 2017-11-28 DIAGNOSIS — I959 Hypotension, unspecified: Secondary | ICD-10-CM | POA: Diagnosis not present

## 2017-11-30 DIAGNOSIS — I959 Hypotension, unspecified: Secondary | ICD-10-CM | POA: Diagnosis not present

## 2017-12-04 DIAGNOSIS — J9602 Acute respiratory failure with hypercapnia: Secondary | ICD-10-CM | POA: Diagnosis not present

## 2017-12-04 DIAGNOSIS — M5489 Other dorsalgia: Secondary | ICD-10-CM | POA: Diagnosis not present

## 2017-12-04 DIAGNOSIS — J811 Chronic pulmonary edema: Secondary | ICD-10-CM | POA: Diagnosis not present

## 2017-12-04 DIAGNOSIS — H547 Unspecified visual loss: Secondary | ICD-10-CM | POA: Diagnosis not present

## 2017-12-04 DIAGNOSIS — E872 Acidosis: Secondary | ICD-10-CM | POA: Diagnosis not present

## 2017-12-04 DIAGNOSIS — Z9989 Dependence on other enabling machines and devices: Secondary | ICD-10-CM | POA: Diagnosis not present

## 2017-12-04 DIAGNOSIS — I509 Heart failure, unspecified: Secondary | ICD-10-CM | POA: Diagnosis not present

## 2017-12-04 DIAGNOSIS — J449 Chronic obstructive pulmonary disease, unspecified: Secondary | ICD-10-CM | POA: Diagnosis not present

## 2017-12-04 DIAGNOSIS — Z85528 Personal history of other malignant neoplasm of kidney: Secondary | ICD-10-CM | POA: Diagnosis not present

## 2017-12-04 DIAGNOSIS — I10 Essential (primary) hypertension: Secondary | ICD-10-CM | POA: Diagnosis not present

## 2017-12-04 DIAGNOSIS — J9622 Acute and chronic respiratory failure with hypercapnia: Secondary | ICD-10-CM | POA: Diagnosis not present

## 2017-12-04 DIAGNOSIS — I959 Hypotension, unspecified: Secondary | ICD-10-CM | POA: Diagnosis not present

## 2017-12-04 DIAGNOSIS — G4733 Obstructive sleep apnea (adult) (pediatric): Secondary | ICD-10-CM | POA: Diagnosis not present

## 2017-12-04 DIAGNOSIS — E875 Hyperkalemia: Secondary | ICD-10-CM | POA: Diagnosis not present

## 2017-12-04 DIAGNOSIS — Z9119 Patient's noncompliance with other medical treatment and regimen: Secondary | ICD-10-CM | POA: Diagnosis not present

## 2017-12-04 DIAGNOSIS — N183 Chronic kidney disease, stage 3 (moderate): Secondary | ICD-10-CM | POA: Diagnosis not present

## 2017-12-04 DIAGNOSIS — J9621 Acute and chronic respiratory failure with hypoxia: Secondary | ICD-10-CM | POA: Diagnosis not present

## 2017-12-04 DIAGNOSIS — G4731 Primary central sleep apnea: Secondary | ICD-10-CM | POA: Diagnosis not present

## 2017-12-04 DIAGNOSIS — E785 Hyperlipidemia, unspecified: Secondary | ICD-10-CM | POA: Diagnosis not present

## 2017-12-04 DIAGNOSIS — I130 Hypertensive heart and chronic kidney disease with heart failure and stage 1 through stage 4 chronic kidney disease, or unspecified chronic kidney disease: Secondary | ICD-10-CM | POA: Diagnosis not present

## 2017-12-04 DIAGNOSIS — M109 Gout, unspecified: Secondary | ICD-10-CM | POA: Diagnosis not present

## 2017-12-04 DIAGNOSIS — I499 Cardiac arrhythmia, unspecified: Secondary | ICD-10-CM | POA: Diagnosis not present

## 2017-12-04 DIAGNOSIS — R0902 Hypoxemia: Secondary | ICD-10-CM | POA: Diagnosis not present

## 2017-12-04 DIAGNOSIS — I5033 Acute on chronic diastolic (congestive) heart failure: Secondary | ICD-10-CM | POA: Diagnosis not present

## 2017-12-04 DIAGNOSIS — Z8674 Personal history of sudden cardiac arrest: Secondary | ICD-10-CM | POA: Diagnosis not present

## 2017-12-12 DIAGNOSIS — J9610 Chronic respiratory failure, unspecified whether with hypoxia or hypercapnia: Secondary | ICD-10-CM | POA: Diagnosis not present

## 2017-12-13 DIAGNOSIS — C641 Malignant neoplasm of right kidney, except renal pelvis: Secondary | ICD-10-CM | POA: Diagnosis not present

## 2017-12-13 DIAGNOSIS — G4733 Obstructive sleep apnea (adult) (pediatric): Secondary | ICD-10-CM | POA: Diagnosis not present

## 2017-12-13 DIAGNOSIS — I509 Heart failure, unspecified: Secondary | ICD-10-CM | POA: Diagnosis not present

## 2017-12-13 DIAGNOSIS — H6123 Impacted cerumen, bilateral: Secondary | ICD-10-CM | POA: Diagnosis not present

## 2017-12-13 DIAGNOSIS — J9602 Acute respiratory failure with hypercapnia: Secondary | ICD-10-CM | POA: Diagnosis not present

## 2017-12-13 DIAGNOSIS — N183 Chronic kidney disease, stage 3 (moderate): Secondary | ICD-10-CM | POA: Diagnosis not present

## 2017-12-14 DIAGNOSIS — J9611 Chronic respiratory failure with hypoxia: Secondary | ICD-10-CM | POA: Diagnosis not present

## 2017-12-14 DIAGNOSIS — Z85528 Personal history of other malignant neoplasm of kidney: Secondary | ICD-10-CM | POA: Diagnosis not present

## 2017-12-14 DIAGNOSIS — Z8674 Personal history of sudden cardiac arrest: Secondary | ICD-10-CM | POA: Diagnosis not present

## 2017-12-14 DIAGNOSIS — M549 Dorsalgia, unspecified: Secondary | ICD-10-CM | POA: Diagnosis not present

## 2017-12-14 DIAGNOSIS — Z87891 Personal history of nicotine dependence: Secondary | ICD-10-CM | POA: Diagnosis not present

## 2017-12-14 DIAGNOSIS — J449 Chronic obstructive pulmonary disease, unspecified: Secondary | ICD-10-CM | POA: Diagnosis not present

## 2017-12-14 DIAGNOSIS — Z7982 Long term (current) use of aspirin: Secondary | ICD-10-CM | POA: Diagnosis not present

## 2017-12-14 DIAGNOSIS — I1310 Hypertensive heart and chronic kidney disease without heart failure, with stage 1 through stage 4 chronic kidney disease, or unspecified chronic kidney disease: Secondary | ICD-10-CM | POA: Diagnosis not present

## 2017-12-14 DIAGNOSIS — Z9181 History of falling: Secondary | ICD-10-CM | POA: Diagnosis not present

## 2017-12-14 DIAGNOSIS — J9612 Chronic respiratory failure with hypercapnia: Secondary | ICD-10-CM | POA: Diagnosis not present

## 2017-12-14 DIAGNOSIS — N183 Chronic kidney disease, stage 3 (moderate): Secondary | ICD-10-CM | POA: Diagnosis not present

## 2017-12-14 DIAGNOSIS — M1991 Primary osteoarthritis, unspecified site: Secondary | ICD-10-CM | POA: Diagnosis not present

## 2017-12-14 DIAGNOSIS — Z950 Presence of cardiac pacemaker: Secondary | ICD-10-CM | POA: Diagnosis not present

## 2017-12-14 DIAGNOSIS — G894 Chronic pain syndrome: Secondary | ICD-10-CM | POA: Diagnosis not present

## 2017-12-14 DIAGNOSIS — M1A9XX0 Chronic gout, unspecified, without tophus (tophi): Secondary | ICD-10-CM | POA: Diagnosis not present

## 2017-12-14 DIAGNOSIS — E538 Deficiency of other specified B group vitamins: Secondary | ICD-10-CM | POA: Diagnosis not present

## 2017-12-17 DIAGNOSIS — M1A9XX0 Chronic gout, unspecified, without tophus (tophi): Secondary | ICD-10-CM | POA: Diagnosis not present

## 2017-12-17 DIAGNOSIS — J449 Chronic obstructive pulmonary disease, unspecified: Secondary | ICD-10-CM | POA: Diagnosis not present

## 2017-12-17 DIAGNOSIS — G894 Chronic pain syndrome: Secondary | ICD-10-CM | POA: Diagnosis not present

## 2017-12-17 DIAGNOSIS — I130 Hypertensive heart and chronic kidney disease with heart failure and stage 1 through stage 4 chronic kidney disease, or unspecified chronic kidney disease: Secondary | ICD-10-CM | POA: Diagnosis not present

## 2017-12-17 DIAGNOSIS — Z8674 Personal history of sudden cardiac arrest: Secondary | ICD-10-CM | POA: Diagnosis not present

## 2017-12-17 DIAGNOSIS — G4733 Obstructive sleep apnea (adult) (pediatric): Secondary | ICD-10-CM | POA: Diagnosis not present

## 2017-12-17 DIAGNOSIS — N183 Chronic kidney disease, stage 3 (moderate): Secondary | ICD-10-CM | POA: Diagnosis not present

## 2017-12-17 DIAGNOSIS — Z85528 Personal history of other malignant neoplasm of kidney: Secondary | ICD-10-CM | POA: Diagnosis not present

## 2017-12-17 DIAGNOSIS — J9621 Acute and chronic respiratory failure with hypoxia: Secondary | ICD-10-CM | POA: Diagnosis not present

## 2017-12-17 DIAGNOSIS — M549 Dorsalgia, unspecified: Secondary | ICD-10-CM | POA: Diagnosis not present

## 2017-12-17 DIAGNOSIS — M1991 Primary osteoarthritis, unspecified site: Secondary | ICD-10-CM | POA: Diagnosis not present

## 2017-12-17 DIAGNOSIS — I5033 Acute on chronic diastolic (congestive) heart failure: Secondary | ICD-10-CM | POA: Diagnosis not present

## 2017-12-17 DIAGNOSIS — Z9181 History of falling: Secondary | ICD-10-CM | POA: Diagnosis not present

## 2017-12-17 DIAGNOSIS — E785 Hyperlipidemia, unspecified: Secondary | ICD-10-CM | POA: Diagnosis not present

## 2017-12-17 DIAGNOSIS — Z7982 Long term (current) use of aspirin: Secondary | ICD-10-CM | POA: Diagnosis not present

## 2017-12-17 DIAGNOSIS — Z87891 Personal history of nicotine dependence: Secondary | ICD-10-CM | POA: Diagnosis not present

## 2017-12-17 DIAGNOSIS — J9622 Acute and chronic respiratory failure with hypercapnia: Secondary | ICD-10-CM | POA: Diagnosis not present

## 2017-12-17 DIAGNOSIS — E538 Deficiency of other specified B group vitamins: Secondary | ICD-10-CM | POA: Diagnosis not present

## 2017-12-17 DIAGNOSIS — Z9981 Dependence on supplemental oxygen: Secondary | ICD-10-CM | POA: Diagnosis not present

## 2017-12-18 DIAGNOSIS — M549 Dorsalgia, unspecified: Secondary | ICD-10-CM | POA: Diagnosis not present

## 2017-12-18 DIAGNOSIS — I1310 Hypertensive heart and chronic kidney disease without heart failure, with stage 1 through stage 4 chronic kidney disease, or unspecified chronic kidney disease: Secondary | ICD-10-CM | POA: Diagnosis not present

## 2017-12-18 DIAGNOSIS — J449 Chronic obstructive pulmonary disease, unspecified: Secondary | ICD-10-CM | POA: Diagnosis not present

## 2017-12-18 DIAGNOSIS — J9611 Chronic respiratory failure with hypoxia: Secondary | ICD-10-CM | POA: Diagnosis not present

## 2017-12-19 DIAGNOSIS — J449 Chronic obstructive pulmonary disease, unspecified: Secondary | ICD-10-CM | POA: Diagnosis not present

## 2017-12-19 DIAGNOSIS — E538 Deficiency of other specified B group vitamins: Secondary | ICD-10-CM | POA: Diagnosis not present

## 2017-12-19 DIAGNOSIS — Z950 Presence of cardiac pacemaker: Secondary | ICD-10-CM | POA: Diagnosis not present

## 2017-12-19 DIAGNOSIS — M1991 Primary osteoarthritis, unspecified site: Secondary | ICD-10-CM | POA: Diagnosis not present

## 2017-12-19 DIAGNOSIS — M1A9XX0 Chronic gout, unspecified, without tophus (tophi): Secondary | ICD-10-CM | POA: Diagnosis not present

## 2017-12-19 DIAGNOSIS — J9612 Chronic respiratory failure with hypercapnia: Secondary | ICD-10-CM | POA: Diagnosis not present

## 2017-12-19 DIAGNOSIS — N183 Chronic kidney disease, stage 3 (moderate): Secondary | ICD-10-CM | POA: Diagnosis not present

## 2017-12-19 DIAGNOSIS — M549 Dorsalgia, unspecified: Secondary | ICD-10-CM | POA: Diagnosis not present

## 2017-12-19 DIAGNOSIS — Z7982 Long term (current) use of aspirin: Secondary | ICD-10-CM | POA: Diagnosis not present

## 2017-12-19 DIAGNOSIS — I1310 Hypertensive heart and chronic kidney disease without heart failure, with stage 1 through stage 4 chronic kidney disease, or unspecified chronic kidney disease: Secondary | ICD-10-CM | POA: Diagnosis not present

## 2017-12-19 DIAGNOSIS — Z9181 History of falling: Secondary | ICD-10-CM | POA: Diagnosis not present

## 2017-12-19 DIAGNOSIS — Z85528 Personal history of other malignant neoplasm of kidney: Secondary | ICD-10-CM | POA: Diagnosis not present

## 2017-12-19 DIAGNOSIS — J9611 Chronic respiratory failure with hypoxia: Secondary | ICD-10-CM | POA: Diagnosis not present

## 2017-12-19 DIAGNOSIS — G894 Chronic pain syndrome: Secondary | ICD-10-CM | POA: Diagnosis not present

## 2017-12-19 DIAGNOSIS — Z87891 Personal history of nicotine dependence: Secondary | ICD-10-CM | POA: Diagnosis not present

## 2017-12-19 DIAGNOSIS — Z8674 Personal history of sudden cardiac arrest: Secondary | ICD-10-CM | POA: Diagnosis not present

## 2017-12-21 DIAGNOSIS — I1310 Hypertensive heart and chronic kidney disease without heart failure, with stage 1 through stage 4 chronic kidney disease, or unspecified chronic kidney disease: Secondary | ICD-10-CM | POA: Diagnosis not present

## 2017-12-21 DIAGNOSIS — Z950 Presence of cardiac pacemaker: Secondary | ICD-10-CM | POA: Diagnosis not present

## 2017-12-21 DIAGNOSIS — J9611 Chronic respiratory failure with hypoxia: Secondary | ICD-10-CM | POA: Diagnosis not present

## 2017-12-21 DIAGNOSIS — M1A9XX0 Chronic gout, unspecified, without tophus (tophi): Secondary | ICD-10-CM | POA: Diagnosis not present

## 2017-12-21 DIAGNOSIS — Z8674 Personal history of sudden cardiac arrest: Secondary | ICD-10-CM | POA: Diagnosis not present

## 2017-12-21 DIAGNOSIS — E538 Deficiency of other specified B group vitamins: Secondary | ICD-10-CM | POA: Diagnosis not present

## 2017-12-21 DIAGNOSIS — M1991 Primary osteoarthritis, unspecified site: Secondary | ICD-10-CM | POA: Diagnosis not present

## 2017-12-21 DIAGNOSIS — N183 Chronic kidney disease, stage 3 (moderate): Secondary | ICD-10-CM | POA: Diagnosis not present

## 2017-12-21 DIAGNOSIS — Z85528 Personal history of other malignant neoplasm of kidney: Secondary | ICD-10-CM | POA: Diagnosis not present

## 2017-12-21 DIAGNOSIS — J9612 Chronic respiratory failure with hypercapnia: Secondary | ICD-10-CM | POA: Diagnosis not present

## 2017-12-21 DIAGNOSIS — G894 Chronic pain syndrome: Secondary | ICD-10-CM | POA: Diagnosis not present

## 2017-12-21 DIAGNOSIS — J449 Chronic obstructive pulmonary disease, unspecified: Secondary | ICD-10-CM | POA: Diagnosis not present

## 2017-12-21 DIAGNOSIS — M549 Dorsalgia, unspecified: Secondary | ICD-10-CM | POA: Diagnosis not present

## 2017-12-21 DIAGNOSIS — Z9181 History of falling: Secondary | ICD-10-CM | POA: Diagnosis not present

## 2017-12-21 DIAGNOSIS — Z87891 Personal history of nicotine dependence: Secondary | ICD-10-CM | POA: Diagnosis not present

## 2017-12-21 DIAGNOSIS — Z7982 Long term (current) use of aspirin: Secondary | ICD-10-CM | POA: Diagnosis not present

## 2017-12-24 DIAGNOSIS — M549 Dorsalgia, unspecified: Secondary | ICD-10-CM | POA: Diagnosis not present

## 2017-12-24 DIAGNOSIS — J9611 Chronic respiratory failure with hypoxia: Secondary | ICD-10-CM | POA: Diagnosis not present

## 2017-12-24 DIAGNOSIS — N183 Chronic kidney disease, stage 3 (moderate): Secondary | ICD-10-CM | POA: Diagnosis not present

## 2017-12-24 DIAGNOSIS — G4733 Obstructive sleep apnea (adult) (pediatric): Secondary | ICD-10-CM | POA: Diagnosis not present

## 2017-12-24 DIAGNOSIS — Z87891 Personal history of nicotine dependence: Secondary | ICD-10-CM | POA: Diagnosis not present

## 2017-12-24 DIAGNOSIS — M1991 Primary osteoarthritis, unspecified site: Secondary | ICD-10-CM | POA: Diagnosis not present

## 2017-12-24 DIAGNOSIS — Z9181 History of falling: Secondary | ICD-10-CM | POA: Diagnosis not present

## 2017-12-24 DIAGNOSIS — Z85528 Personal history of other malignant neoplasm of kidney: Secondary | ICD-10-CM | POA: Diagnosis not present

## 2017-12-24 DIAGNOSIS — Z950 Presence of cardiac pacemaker: Secondary | ICD-10-CM | POA: Diagnosis not present

## 2017-12-24 DIAGNOSIS — J9612 Chronic respiratory failure with hypercapnia: Secondary | ICD-10-CM | POA: Diagnosis not present

## 2017-12-24 DIAGNOSIS — I1310 Hypertensive heart and chronic kidney disease without heart failure, with stage 1 through stage 4 chronic kidney disease, or unspecified chronic kidney disease: Secondary | ICD-10-CM | POA: Diagnosis not present

## 2017-12-24 DIAGNOSIS — E538 Deficiency of other specified B group vitamins: Secondary | ICD-10-CM | POA: Diagnosis not present

## 2017-12-24 DIAGNOSIS — Z7982 Long term (current) use of aspirin: Secondary | ICD-10-CM | POA: Diagnosis not present

## 2017-12-24 DIAGNOSIS — M1A9XX0 Chronic gout, unspecified, without tophus (tophi): Secondary | ICD-10-CM | POA: Diagnosis not present

## 2017-12-24 DIAGNOSIS — G894 Chronic pain syndrome: Secondary | ICD-10-CM | POA: Diagnosis not present

## 2017-12-24 DIAGNOSIS — J449 Chronic obstructive pulmonary disease, unspecified: Secondary | ICD-10-CM | POA: Diagnosis not present

## 2017-12-24 DIAGNOSIS — Z8674 Personal history of sudden cardiac arrest: Secondary | ICD-10-CM | POA: Diagnosis not present

## 2017-12-26 DIAGNOSIS — M1A9XX0 Chronic gout, unspecified, without tophus (tophi): Secondary | ICD-10-CM | POA: Diagnosis not present

## 2017-12-26 DIAGNOSIS — G894 Chronic pain syndrome: Secondary | ICD-10-CM | POA: Diagnosis not present

## 2017-12-26 DIAGNOSIS — E538 Deficiency of other specified B group vitamins: Secondary | ICD-10-CM | POA: Diagnosis not present

## 2017-12-26 DIAGNOSIS — Z7982 Long term (current) use of aspirin: Secondary | ICD-10-CM | POA: Diagnosis not present

## 2017-12-26 DIAGNOSIS — Z87891 Personal history of nicotine dependence: Secondary | ICD-10-CM | POA: Diagnosis not present

## 2017-12-26 DIAGNOSIS — Z9981 Dependence on supplemental oxygen: Secondary | ICD-10-CM | POA: Diagnosis not present

## 2017-12-26 DIAGNOSIS — G4733 Obstructive sleep apnea (adult) (pediatric): Secondary | ICD-10-CM | POA: Diagnosis not present

## 2017-12-26 DIAGNOSIS — Z85528 Personal history of other malignant neoplasm of kidney: Secondary | ICD-10-CM | POA: Diagnosis not present

## 2017-12-26 DIAGNOSIS — J9621 Acute and chronic respiratory failure with hypoxia: Secondary | ICD-10-CM | POA: Diagnosis not present

## 2017-12-26 DIAGNOSIS — Z8674 Personal history of sudden cardiac arrest: Secondary | ICD-10-CM | POA: Diagnosis not present

## 2017-12-26 DIAGNOSIS — I5033 Acute on chronic diastolic (congestive) heart failure: Secondary | ICD-10-CM | POA: Diagnosis not present

## 2017-12-26 DIAGNOSIS — Z9181 History of falling: Secondary | ICD-10-CM | POA: Diagnosis not present

## 2017-12-26 DIAGNOSIS — M549 Dorsalgia, unspecified: Secondary | ICD-10-CM | POA: Diagnosis not present

## 2017-12-26 DIAGNOSIS — J449 Chronic obstructive pulmonary disease, unspecified: Secondary | ICD-10-CM | POA: Diagnosis not present

## 2017-12-26 DIAGNOSIS — I130 Hypertensive heart and chronic kidney disease with heart failure and stage 1 through stage 4 chronic kidney disease, or unspecified chronic kidney disease: Secondary | ICD-10-CM | POA: Diagnosis not present

## 2017-12-26 DIAGNOSIS — E785 Hyperlipidemia, unspecified: Secondary | ICD-10-CM | POA: Diagnosis not present

## 2017-12-26 DIAGNOSIS — N183 Chronic kidney disease, stage 3 (moderate): Secondary | ICD-10-CM | POA: Diagnosis not present

## 2017-12-26 DIAGNOSIS — J9622 Acute and chronic respiratory failure with hypercapnia: Secondary | ICD-10-CM | POA: Diagnosis not present

## 2017-12-26 DIAGNOSIS — M1991 Primary osteoarthritis, unspecified site: Secondary | ICD-10-CM | POA: Diagnosis not present

## 2017-12-28 DIAGNOSIS — I5033 Acute on chronic diastolic (congestive) heart failure: Secondary | ICD-10-CM | POA: Diagnosis not present

## 2017-12-28 DIAGNOSIS — M1A9XX0 Chronic gout, unspecified, without tophus (tophi): Secondary | ICD-10-CM | POA: Diagnosis not present

## 2017-12-28 DIAGNOSIS — M549 Dorsalgia, unspecified: Secondary | ICD-10-CM | POA: Diagnosis not present

## 2017-12-28 DIAGNOSIS — G894 Chronic pain syndrome: Secondary | ICD-10-CM | POA: Diagnosis not present

## 2017-12-28 DIAGNOSIS — Z85528 Personal history of other malignant neoplasm of kidney: Secondary | ICD-10-CM | POA: Diagnosis not present

## 2017-12-28 DIAGNOSIS — E785 Hyperlipidemia, unspecified: Secondary | ICD-10-CM | POA: Diagnosis not present

## 2017-12-28 DIAGNOSIS — G4733 Obstructive sleep apnea (adult) (pediatric): Secondary | ICD-10-CM | POA: Diagnosis not present

## 2017-12-28 DIAGNOSIS — M1991 Primary osteoarthritis, unspecified site: Secondary | ICD-10-CM | POA: Diagnosis not present

## 2017-12-28 DIAGNOSIS — N183 Chronic kidney disease, stage 3 (moderate): Secondary | ICD-10-CM | POA: Diagnosis not present

## 2017-12-28 DIAGNOSIS — E538 Deficiency of other specified B group vitamins: Secondary | ICD-10-CM | POA: Diagnosis not present

## 2017-12-28 DIAGNOSIS — J9621 Acute and chronic respiratory failure with hypoxia: Secondary | ICD-10-CM | POA: Diagnosis not present

## 2017-12-28 DIAGNOSIS — Z87891 Personal history of nicotine dependence: Secondary | ICD-10-CM | POA: Diagnosis not present

## 2017-12-28 DIAGNOSIS — Z9981 Dependence on supplemental oxygen: Secondary | ICD-10-CM | POA: Diagnosis not present

## 2017-12-28 DIAGNOSIS — J449 Chronic obstructive pulmonary disease, unspecified: Secondary | ICD-10-CM | POA: Diagnosis not present

## 2017-12-28 DIAGNOSIS — I130 Hypertensive heart and chronic kidney disease with heart failure and stage 1 through stage 4 chronic kidney disease, or unspecified chronic kidney disease: Secondary | ICD-10-CM | POA: Diagnosis not present

## 2017-12-28 DIAGNOSIS — Z7982 Long term (current) use of aspirin: Secondary | ICD-10-CM | POA: Diagnosis not present

## 2017-12-28 DIAGNOSIS — Z8674 Personal history of sudden cardiac arrest: Secondary | ICD-10-CM | POA: Diagnosis not present

## 2017-12-28 DIAGNOSIS — Z9181 History of falling: Secondary | ICD-10-CM | POA: Diagnosis not present

## 2017-12-28 DIAGNOSIS — J9622 Acute and chronic respiratory failure with hypercapnia: Secondary | ICD-10-CM | POA: Diagnosis not present

## 2017-12-31 DIAGNOSIS — J9622 Acute and chronic respiratory failure with hypercapnia: Secondary | ICD-10-CM | POA: Diagnosis not present

## 2017-12-31 DIAGNOSIS — Z9981 Dependence on supplemental oxygen: Secondary | ICD-10-CM | POA: Diagnosis not present

## 2017-12-31 DIAGNOSIS — Z7982 Long term (current) use of aspirin: Secondary | ICD-10-CM | POA: Diagnosis not present

## 2017-12-31 DIAGNOSIS — I130 Hypertensive heart and chronic kidney disease with heart failure and stage 1 through stage 4 chronic kidney disease, or unspecified chronic kidney disease: Secondary | ICD-10-CM | POA: Diagnosis not present

## 2017-12-31 DIAGNOSIS — I5033 Acute on chronic diastolic (congestive) heart failure: Secondary | ICD-10-CM | POA: Diagnosis not present

## 2017-12-31 DIAGNOSIS — J9621 Acute and chronic respiratory failure with hypoxia: Secondary | ICD-10-CM | POA: Diagnosis not present

## 2017-12-31 DIAGNOSIS — G894 Chronic pain syndrome: Secondary | ICD-10-CM | POA: Diagnosis not present

## 2017-12-31 DIAGNOSIS — M1991 Primary osteoarthritis, unspecified site: Secondary | ICD-10-CM | POA: Diagnosis not present

## 2017-12-31 DIAGNOSIS — E785 Hyperlipidemia, unspecified: Secondary | ICD-10-CM | POA: Diagnosis not present

## 2017-12-31 DIAGNOSIS — Z8674 Personal history of sudden cardiac arrest: Secondary | ICD-10-CM | POA: Diagnosis not present

## 2017-12-31 DIAGNOSIS — M549 Dorsalgia, unspecified: Secondary | ICD-10-CM | POA: Diagnosis not present

## 2017-12-31 DIAGNOSIS — E538 Deficiency of other specified B group vitamins: Secondary | ICD-10-CM | POA: Diagnosis not present

## 2017-12-31 DIAGNOSIS — M1A9XX0 Chronic gout, unspecified, without tophus (tophi): Secondary | ICD-10-CM | POA: Diagnosis not present

## 2017-12-31 DIAGNOSIS — Z9181 History of falling: Secondary | ICD-10-CM | POA: Diagnosis not present

## 2017-12-31 DIAGNOSIS — Z85528 Personal history of other malignant neoplasm of kidney: Secondary | ICD-10-CM | POA: Diagnosis not present

## 2017-12-31 DIAGNOSIS — G4733 Obstructive sleep apnea (adult) (pediatric): Secondary | ICD-10-CM | POA: Diagnosis not present

## 2017-12-31 DIAGNOSIS — N183 Chronic kidney disease, stage 3 (moderate): Secondary | ICD-10-CM | POA: Diagnosis not present

## 2017-12-31 DIAGNOSIS — Z87891 Personal history of nicotine dependence: Secondary | ICD-10-CM | POA: Diagnosis not present

## 2017-12-31 DIAGNOSIS — J449 Chronic obstructive pulmonary disease, unspecified: Secondary | ICD-10-CM | POA: Diagnosis not present

## 2018-01-02 DIAGNOSIS — J449 Chronic obstructive pulmonary disease, unspecified: Secondary | ICD-10-CM | POA: Diagnosis not present

## 2018-01-02 DIAGNOSIS — M1991 Primary osteoarthritis, unspecified site: Secondary | ICD-10-CM | POA: Diagnosis not present

## 2018-01-02 DIAGNOSIS — Z8674 Personal history of sudden cardiac arrest: Secondary | ICD-10-CM | POA: Diagnosis not present

## 2018-01-02 DIAGNOSIS — N183 Chronic kidney disease, stage 3 (moderate): Secondary | ICD-10-CM | POA: Diagnosis not present

## 2018-01-02 DIAGNOSIS — J9622 Acute and chronic respiratory failure with hypercapnia: Secondary | ICD-10-CM | POA: Diagnosis not present

## 2018-01-02 DIAGNOSIS — G4733 Obstructive sleep apnea (adult) (pediatric): Secondary | ICD-10-CM | POA: Diagnosis not present

## 2018-01-02 DIAGNOSIS — Z87891 Personal history of nicotine dependence: Secondary | ICD-10-CM | POA: Diagnosis not present

## 2018-01-02 DIAGNOSIS — Z85528 Personal history of other malignant neoplasm of kidney: Secondary | ICD-10-CM | POA: Diagnosis not present

## 2018-01-02 DIAGNOSIS — G894 Chronic pain syndrome: Secondary | ICD-10-CM | POA: Diagnosis not present

## 2018-01-02 DIAGNOSIS — I5033 Acute on chronic diastolic (congestive) heart failure: Secondary | ICD-10-CM | POA: Diagnosis not present

## 2018-01-02 DIAGNOSIS — M1A9XX0 Chronic gout, unspecified, without tophus (tophi): Secondary | ICD-10-CM | POA: Diagnosis not present

## 2018-01-02 DIAGNOSIS — Z9181 History of falling: Secondary | ICD-10-CM | POA: Diagnosis not present

## 2018-01-02 DIAGNOSIS — E785 Hyperlipidemia, unspecified: Secondary | ICD-10-CM | POA: Diagnosis not present

## 2018-01-02 DIAGNOSIS — Z7982 Long term (current) use of aspirin: Secondary | ICD-10-CM | POA: Diagnosis not present

## 2018-01-02 DIAGNOSIS — E538 Deficiency of other specified B group vitamins: Secondary | ICD-10-CM | POA: Diagnosis not present

## 2018-01-02 DIAGNOSIS — M549 Dorsalgia, unspecified: Secondary | ICD-10-CM | POA: Diagnosis not present

## 2018-01-02 DIAGNOSIS — I130 Hypertensive heart and chronic kidney disease with heart failure and stage 1 through stage 4 chronic kidney disease, or unspecified chronic kidney disease: Secondary | ICD-10-CM | POA: Diagnosis not present

## 2018-01-02 DIAGNOSIS — J9621 Acute and chronic respiratory failure with hypoxia: Secondary | ICD-10-CM | POA: Diagnosis not present

## 2018-01-02 DIAGNOSIS — Z9981 Dependence on supplemental oxygen: Secondary | ICD-10-CM | POA: Diagnosis not present

## 2018-01-07 DIAGNOSIS — G4733 Obstructive sleep apnea (adult) (pediatric): Secondary | ICD-10-CM | POA: Diagnosis not present

## 2018-01-07 DIAGNOSIS — Z8674 Personal history of sudden cardiac arrest: Secondary | ICD-10-CM | POA: Diagnosis not present

## 2018-01-07 DIAGNOSIS — Z7982 Long term (current) use of aspirin: Secondary | ICD-10-CM | POA: Diagnosis not present

## 2018-01-07 DIAGNOSIS — Z9181 History of falling: Secondary | ICD-10-CM | POA: Diagnosis not present

## 2018-01-07 DIAGNOSIS — J9622 Acute and chronic respiratory failure with hypercapnia: Secondary | ICD-10-CM | POA: Diagnosis not present

## 2018-01-07 DIAGNOSIS — Z87891 Personal history of nicotine dependence: Secondary | ICD-10-CM | POA: Diagnosis not present

## 2018-01-07 DIAGNOSIS — E538 Deficiency of other specified B group vitamins: Secondary | ICD-10-CM | POA: Diagnosis not present

## 2018-01-07 DIAGNOSIS — Z9981 Dependence on supplemental oxygen: Secondary | ICD-10-CM | POA: Diagnosis not present

## 2018-01-07 DIAGNOSIS — M1A9XX0 Chronic gout, unspecified, without tophus (tophi): Secondary | ICD-10-CM | POA: Diagnosis not present

## 2018-01-07 DIAGNOSIS — I4892 Unspecified atrial flutter: Secondary | ICD-10-CM | POA: Diagnosis not present

## 2018-01-07 DIAGNOSIS — M549 Dorsalgia, unspecified: Secondary | ICD-10-CM | POA: Diagnosis not present

## 2018-01-07 DIAGNOSIS — Z4509 Encounter for adjustment and management of other cardiac device: Secondary | ICD-10-CM | POA: Diagnosis not present

## 2018-01-07 DIAGNOSIS — J449 Chronic obstructive pulmonary disease, unspecified: Secondary | ICD-10-CM | POA: Diagnosis not present

## 2018-01-07 DIAGNOSIS — J9621 Acute and chronic respiratory failure with hypoxia: Secondary | ICD-10-CM | POA: Diagnosis not present

## 2018-01-07 DIAGNOSIS — N183 Chronic kidney disease, stage 3 (moderate): Secondary | ICD-10-CM | POA: Diagnosis not present

## 2018-01-07 DIAGNOSIS — G894 Chronic pain syndrome: Secondary | ICD-10-CM | POA: Diagnosis not present

## 2018-01-07 DIAGNOSIS — Z85528 Personal history of other malignant neoplasm of kidney: Secondary | ICD-10-CM | POA: Diagnosis not present

## 2018-01-07 DIAGNOSIS — I5033 Acute on chronic diastolic (congestive) heart failure: Secondary | ICD-10-CM | POA: Diagnosis not present

## 2018-01-07 DIAGNOSIS — M1991 Primary osteoarthritis, unspecified site: Secondary | ICD-10-CM | POA: Diagnosis not present

## 2018-01-07 DIAGNOSIS — I130 Hypertensive heart and chronic kidney disease with heart failure and stage 1 through stage 4 chronic kidney disease, or unspecified chronic kidney disease: Secondary | ICD-10-CM | POA: Diagnosis not present

## 2018-01-07 DIAGNOSIS — E785 Hyperlipidemia, unspecified: Secondary | ICD-10-CM | POA: Diagnosis not present

## 2018-01-08 DIAGNOSIS — C642 Malignant neoplasm of left kidney, except renal pelvis: Secondary | ICD-10-CM | POA: Diagnosis not present

## 2018-01-08 DIAGNOSIS — C649 Malignant neoplasm of unspecified kidney, except renal pelvis: Secondary | ICD-10-CM | POA: Diagnosis not present

## 2018-01-08 DIAGNOSIS — Z905 Acquired absence of kidney: Secondary | ICD-10-CM | POA: Diagnosis not present

## 2018-01-09 DIAGNOSIS — I130 Hypertensive heart and chronic kidney disease with heart failure and stage 1 through stage 4 chronic kidney disease, or unspecified chronic kidney disease: Secondary | ICD-10-CM | POA: Diagnosis not present

## 2018-01-09 DIAGNOSIS — Z9981 Dependence on supplemental oxygen: Secondary | ICD-10-CM | POA: Diagnosis not present

## 2018-01-09 DIAGNOSIS — M549 Dorsalgia, unspecified: Secondary | ICD-10-CM | POA: Diagnosis not present

## 2018-01-09 DIAGNOSIS — Z87891 Personal history of nicotine dependence: Secondary | ICD-10-CM | POA: Diagnosis not present

## 2018-01-09 DIAGNOSIS — J9621 Acute and chronic respiratory failure with hypoxia: Secondary | ICD-10-CM | POA: Diagnosis not present

## 2018-01-09 DIAGNOSIS — Z9181 History of falling: Secondary | ICD-10-CM | POA: Diagnosis not present

## 2018-01-09 DIAGNOSIS — I5033 Acute on chronic diastolic (congestive) heart failure: Secondary | ICD-10-CM | POA: Diagnosis not present

## 2018-01-09 DIAGNOSIS — N183 Chronic kidney disease, stage 3 (moderate): Secondary | ICD-10-CM | POA: Diagnosis not present

## 2018-01-09 DIAGNOSIS — G4733 Obstructive sleep apnea (adult) (pediatric): Secondary | ICD-10-CM | POA: Diagnosis not present

## 2018-01-09 DIAGNOSIS — Z7982 Long term (current) use of aspirin: Secondary | ICD-10-CM | POA: Diagnosis not present

## 2018-01-09 DIAGNOSIS — J449 Chronic obstructive pulmonary disease, unspecified: Secondary | ICD-10-CM | POA: Diagnosis not present

## 2018-01-09 DIAGNOSIS — Z8674 Personal history of sudden cardiac arrest: Secondary | ICD-10-CM | POA: Diagnosis not present

## 2018-01-09 DIAGNOSIS — G894 Chronic pain syndrome: Secondary | ICD-10-CM | POA: Diagnosis not present

## 2018-01-09 DIAGNOSIS — E785 Hyperlipidemia, unspecified: Secondary | ICD-10-CM | POA: Diagnosis not present

## 2018-01-09 DIAGNOSIS — Z85528 Personal history of other malignant neoplasm of kidney: Secondary | ICD-10-CM | POA: Diagnosis not present

## 2018-01-09 DIAGNOSIS — M1991 Primary osteoarthritis, unspecified site: Secondary | ICD-10-CM | POA: Diagnosis not present

## 2018-01-09 DIAGNOSIS — M1A9XX0 Chronic gout, unspecified, without tophus (tophi): Secondary | ICD-10-CM | POA: Diagnosis not present

## 2018-01-09 DIAGNOSIS — J9622 Acute and chronic respiratory failure with hypercapnia: Secondary | ICD-10-CM | POA: Diagnosis not present

## 2018-01-09 DIAGNOSIS — E538 Deficiency of other specified B group vitamins: Secondary | ICD-10-CM | POA: Diagnosis not present

## 2018-01-12 DIAGNOSIS — J9610 Chronic respiratory failure, unspecified whether with hypoxia or hypercapnia: Secondary | ICD-10-CM | POA: Diagnosis not present

## 2018-01-15 DIAGNOSIS — M1A9XX0 Chronic gout, unspecified, without tophus (tophi): Secondary | ICD-10-CM | POA: Diagnosis not present

## 2018-01-15 DIAGNOSIS — E785 Hyperlipidemia, unspecified: Secondary | ICD-10-CM | POA: Diagnosis not present

## 2018-01-15 DIAGNOSIS — G894 Chronic pain syndrome: Secondary | ICD-10-CM | POA: Diagnosis not present

## 2018-01-15 DIAGNOSIS — M1991 Primary osteoarthritis, unspecified site: Secondary | ICD-10-CM | POA: Diagnosis not present

## 2018-01-15 DIAGNOSIS — I5033 Acute on chronic diastolic (congestive) heart failure: Secondary | ICD-10-CM | POA: Diagnosis not present

## 2018-01-15 DIAGNOSIS — Z9181 History of falling: Secondary | ICD-10-CM | POA: Diagnosis not present

## 2018-01-15 DIAGNOSIS — G4733 Obstructive sleep apnea (adult) (pediatric): Secondary | ICD-10-CM | POA: Diagnosis not present

## 2018-01-15 DIAGNOSIS — Z87891 Personal history of nicotine dependence: Secondary | ICD-10-CM | POA: Diagnosis not present

## 2018-01-15 DIAGNOSIS — Z7982 Long term (current) use of aspirin: Secondary | ICD-10-CM | POA: Diagnosis not present

## 2018-01-15 DIAGNOSIS — J9621 Acute and chronic respiratory failure with hypoxia: Secondary | ICD-10-CM | POA: Diagnosis not present

## 2018-01-15 DIAGNOSIS — Z9981 Dependence on supplemental oxygen: Secondary | ICD-10-CM | POA: Diagnosis not present

## 2018-01-15 DIAGNOSIS — Z8674 Personal history of sudden cardiac arrest: Secondary | ICD-10-CM | POA: Diagnosis not present

## 2018-01-15 DIAGNOSIS — J449 Chronic obstructive pulmonary disease, unspecified: Secondary | ICD-10-CM | POA: Diagnosis not present

## 2018-01-15 DIAGNOSIS — N183 Chronic kidney disease, stage 3 (moderate): Secondary | ICD-10-CM | POA: Diagnosis not present

## 2018-01-15 DIAGNOSIS — I130 Hypertensive heart and chronic kidney disease with heart failure and stage 1 through stage 4 chronic kidney disease, or unspecified chronic kidney disease: Secondary | ICD-10-CM | POA: Diagnosis not present

## 2018-01-15 DIAGNOSIS — E538 Deficiency of other specified B group vitamins: Secondary | ICD-10-CM | POA: Diagnosis not present

## 2018-01-15 DIAGNOSIS — Z85528 Personal history of other malignant neoplasm of kidney: Secondary | ICD-10-CM | POA: Diagnosis not present

## 2018-01-15 DIAGNOSIS — M549 Dorsalgia, unspecified: Secondary | ICD-10-CM | POA: Diagnosis not present

## 2018-01-15 DIAGNOSIS — J9622 Acute and chronic respiratory failure with hypercapnia: Secondary | ICD-10-CM | POA: Diagnosis not present

## 2018-01-17 DIAGNOSIS — Z8674 Personal history of sudden cardiac arrest: Secondary | ICD-10-CM | POA: Diagnosis not present

## 2018-01-17 DIAGNOSIS — G894 Chronic pain syndrome: Secondary | ICD-10-CM | POA: Diagnosis not present

## 2018-01-17 DIAGNOSIS — J9621 Acute and chronic respiratory failure with hypoxia: Secondary | ICD-10-CM | POA: Diagnosis not present

## 2018-01-17 DIAGNOSIS — M549 Dorsalgia, unspecified: Secondary | ICD-10-CM | POA: Diagnosis not present

## 2018-01-17 DIAGNOSIS — J449 Chronic obstructive pulmonary disease, unspecified: Secondary | ICD-10-CM | POA: Diagnosis not present

## 2018-01-17 DIAGNOSIS — N183 Chronic kidney disease, stage 3 (moderate): Secondary | ICD-10-CM | POA: Diagnosis not present

## 2018-01-17 DIAGNOSIS — J9622 Acute and chronic respiratory failure with hypercapnia: Secondary | ICD-10-CM | POA: Diagnosis not present

## 2018-01-17 DIAGNOSIS — M1A9XX0 Chronic gout, unspecified, without tophus (tophi): Secondary | ICD-10-CM | POA: Diagnosis not present

## 2018-01-17 DIAGNOSIS — I130 Hypertensive heart and chronic kidney disease with heart failure and stage 1 through stage 4 chronic kidney disease, or unspecified chronic kidney disease: Secondary | ICD-10-CM | POA: Diagnosis not present

## 2018-01-17 DIAGNOSIS — Z9981 Dependence on supplemental oxygen: Secondary | ICD-10-CM | POA: Diagnosis not present

## 2018-01-17 DIAGNOSIS — E785 Hyperlipidemia, unspecified: Secondary | ICD-10-CM | POA: Diagnosis not present

## 2018-01-17 DIAGNOSIS — Z87891 Personal history of nicotine dependence: Secondary | ICD-10-CM | POA: Diagnosis not present

## 2018-01-17 DIAGNOSIS — Z85528 Personal history of other malignant neoplasm of kidney: Secondary | ICD-10-CM | POA: Diagnosis not present

## 2018-01-17 DIAGNOSIS — G4733 Obstructive sleep apnea (adult) (pediatric): Secondary | ICD-10-CM | POA: Diagnosis not present

## 2018-01-17 DIAGNOSIS — E538 Deficiency of other specified B group vitamins: Secondary | ICD-10-CM | POA: Diagnosis not present

## 2018-01-17 DIAGNOSIS — Z9181 History of falling: Secondary | ICD-10-CM | POA: Diagnosis not present

## 2018-01-17 DIAGNOSIS — M1991 Primary osteoarthritis, unspecified site: Secondary | ICD-10-CM | POA: Diagnosis not present

## 2018-01-17 DIAGNOSIS — I5033 Acute on chronic diastolic (congestive) heart failure: Secondary | ICD-10-CM | POA: Diagnosis not present

## 2018-01-17 DIAGNOSIS — Z7982 Long term (current) use of aspirin: Secondary | ICD-10-CM | POA: Diagnosis not present

## 2018-01-20 DIAGNOSIS — I4892 Unspecified atrial flutter: Secondary | ICD-10-CM | POA: Diagnosis not present

## 2018-01-20 DIAGNOSIS — Z4509 Encounter for adjustment and management of other cardiac device: Secondary | ICD-10-CM | POA: Diagnosis not present

## 2018-01-22 DIAGNOSIS — Z8674 Personal history of sudden cardiac arrest: Secondary | ICD-10-CM | POA: Diagnosis not present

## 2018-01-22 DIAGNOSIS — E538 Deficiency of other specified B group vitamins: Secondary | ICD-10-CM | POA: Diagnosis not present

## 2018-01-22 DIAGNOSIS — M1991 Primary osteoarthritis, unspecified site: Secondary | ICD-10-CM | POA: Diagnosis not present

## 2018-01-22 DIAGNOSIS — Z87891 Personal history of nicotine dependence: Secondary | ICD-10-CM | POA: Diagnosis not present

## 2018-01-22 DIAGNOSIS — I130 Hypertensive heart and chronic kidney disease with heart failure and stage 1 through stage 4 chronic kidney disease, or unspecified chronic kidney disease: Secondary | ICD-10-CM | POA: Diagnosis not present

## 2018-01-22 DIAGNOSIS — M549 Dorsalgia, unspecified: Secondary | ICD-10-CM | POA: Diagnosis not present

## 2018-01-22 DIAGNOSIS — Z9181 History of falling: Secondary | ICD-10-CM | POA: Diagnosis not present

## 2018-01-22 DIAGNOSIS — Z9981 Dependence on supplemental oxygen: Secondary | ICD-10-CM | POA: Diagnosis not present

## 2018-01-22 DIAGNOSIS — J449 Chronic obstructive pulmonary disease, unspecified: Secondary | ICD-10-CM | POA: Diagnosis not present

## 2018-01-22 DIAGNOSIS — M1A9XX0 Chronic gout, unspecified, without tophus (tophi): Secondary | ICD-10-CM | POA: Diagnosis not present

## 2018-01-22 DIAGNOSIS — G894 Chronic pain syndrome: Secondary | ICD-10-CM | POA: Diagnosis not present

## 2018-01-22 DIAGNOSIS — E785 Hyperlipidemia, unspecified: Secondary | ICD-10-CM | POA: Diagnosis not present

## 2018-01-22 DIAGNOSIS — J9622 Acute and chronic respiratory failure with hypercapnia: Secondary | ICD-10-CM | POA: Diagnosis not present

## 2018-01-22 DIAGNOSIS — I5033 Acute on chronic diastolic (congestive) heart failure: Secondary | ICD-10-CM | POA: Diagnosis not present

## 2018-01-22 DIAGNOSIS — G4733 Obstructive sleep apnea (adult) (pediatric): Secondary | ICD-10-CM | POA: Diagnosis not present

## 2018-01-22 DIAGNOSIS — Z7982 Long term (current) use of aspirin: Secondary | ICD-10-CM | POA: Diagnosis not present

## 2018-01-22 DIAGNOSIS — N183 Chronic kidney disease, stage 3 (moderate): Secondary | ICD-10-CM | POA: Diagnosis not present

## 2018-01-22 DIAGNOSIS — Z85528 Personal history of other malignant neoplasm of kidney: Secondary | ICD-10-CM | POA: Diagnosis not present

## 2018-01-22 DIAGNOSIS — J9621 Acute and chronic respiratory failure with hypoxia: Secondary | ICD-10-CM | POA: Diagnosis not present

## 2018-01-23 DIAGNOSIS — Z85528 Personal history of other malignant neoplasm of kidney: Secondary | ICD-10-CM | POA: Diagnosis not present

## 2018-01-23 DIAGNOSIS — M1991 Primary osteoarthritis, unspecified site: Secondary | ICD-10-CM | POA: Diagnosis not present

## 2018-01-23 DIAGNOSIS — E785 Hyperlipidemia, unspecified: Secondary | ICD-10-CM | POA: Diagnosis not present

## 2018-01-23 DIAGNOSIS — G894 Chronic pain syndrome: Secondary | ICD-10-CM | POA: Diagnosis not present

## 2018-01-23 DIAGNOSIS — N183 Chronic kidney disease, stage 3 (moderate): Secondary | ICD-10-CM | POA: Diagnosis not present

## 2018-01-23 DIAGNOSIS — J9621 Acute and chronic respiratory failure with hypoxia: Secondary | ICD-10-CM | POA: Diagnosis not present

## 2018-01-23 DIAGNOSIS — M549 Dorsalgia, unspecified: Secondary | ICD-10-CM | POA: Diagnosis not present

## 2018-01-23 DIAGNOSIS — G4733 Obstructive sleep apnea (adult) (pediatric): Secondary | ICD-10-CM | POA: Diagnosis not present

## 2018-01-23 DIAGNOSIS — J9622 Acute and chronic respiratory failure with hypercapnia: Secondary | ICD-10-CM | POA: Diagnosis not present

## 2018-01-23 DIAGNOSIS — I130 Hypertensive heart and chronic kidney disease with heart failure and stage 1 through stage 4 chronic kidney disease, or unspecified chronic kidney disease: Secondary | ICD-10-CM | POA: Diagnosis not present

## 2018-01-23 DIAGNOSIS — M1A9XX0 Chronic gout, unspecified, without tophus (tophi): Secondary | ICD-10-CM | POA: Diagnosis not present

## 2018-01-23 DIAGNOSIS — Z9981 Dependence on supplemental oxygen: Secondary | ICD-10-CM | POA: Diagnosis not present

## 2018-01-23 DIAGNOSIS — Z8674 Personal history of sudden cardiac arrest: Secondary | ICD-10-CM | POA: Diagnosis not present

## 2018-01-23 DIAGNOSIS — I5033 Acute on chronic diastolic (congestive) heart failure: Secondary | ICD-10-CM | POA: Diagnosis not present

## 2018-01-23 DIAGNOSIS — J449 Chronic obstructive pulmonary disease, unspecified: Secondary | ICD-10-CM | POA: Diagnosis not present

## 2018-01-23 DIAGNOSIS — Z7982 Long term (current) use of aspirin: Secondary | ICD-10-CM | POA: Diagnosis not present

## 2018-01-23 DIAGNOSIS — E538 Deficiency of other specified B group vitamins: Secondary | ICD-10-CM | POA: Diagnosis not present

## 2018-01-23 DIAGNOSIS — Z9181 History of falling: Secondary | ICD-10-CM | POA: Diagnosis not present

## 2018-01-23 DIAGNOSIS — Z87891 Personal history of nicotine dependence: Secondary | ICD-10-CM | POA: Diagnosis not present

## 2018-01-28 DIAGNOSIS — I130 Hypertensive heart and chronic kidney disease with heart failure and stage 1 through stage 4 chronic kidney disease, or unspecified chronic kidney disease: Secondary | ICD-10-CM | POA: Diagnosis not present

## 2018-01-28 DIAGNOSIS — M549 Dorsalgia, unspecified: Secondary | ICD-10-CM | POA: Diagnosis not present

## 2018-01-28 DIAGNOSIS — G4733 Obstructive sleep apnea (adult) (pediatric): Secondary | ICD-10-CM | POA: Diagnosis not present

## 2018-01-28 DIAGNOSIS — Z9181 History of falling: Secondary | ICD-10-CM | POA: Diagnosis not present

## 2018-01-28 DIAGNOSIS — J449 Chronic obstructive pulmonary disease, unspecified: Secondary | ICD-10-CM | POA: Diagnosis not present

## 2018-01-28 DIAGNOSIS — I5033 Acute on chronic diastolic (congestive) heart failure: Secondary | ICD-10-CM | POA: Diagnosis not present

## 2018-01-28 DIAGNOSIS — Z9981 Dependence on supplemental oxygen: Secondary | ICD-10-CM | POA: Diagnosis not present

## 2018-01-28 DIAGNOSIS — E538 Deficiency of other specified B group vitamins: Secondary | ICD-10-CM | POA: Diagnosis not present

## 2018-01-28 DIAGNOSIS — N183 Chronic kidney disease, stage 3 (moderate): Secondary | ICD-10-CM | POA: Diagnosis not present

## 2018-01-28 DIAGNOSIS — Z7982 Long term (current) use of aspirin: Secondary | ICD-10-CM | POA: Diagnosis not present

## 2018-01-28 DIAGNOSIS — M1991 Primary osteoarthritis, unspecified site: Secondary | ICD-10-CM | POA: Diagnosis not present

## 2018-01-28 DIAGNOSIS — Z87891 Personal history of nicotine dependence: Secondary | ICD-10-CM | POA: Diagnosis not present

## 2018-01-28 DIAGNOSIS — Z8674 Personal history of sudden cardiac arrest: Secondary | ICD-10-CM | POA: Diagnosis not present

## 2018-01-28 DIAGNOSIS — Z85528 Personal history of other malignant neoplasm of kidney: Secondary | ICD-10-CM | POA: Diagnosis not present

## 2018-01-28 DIAGNOSIS — E785 Hyperlipidemia, unspecified: Secondary | ICD-10-CM | POA: Diagnosis not present

## 2018-01-28 DIAGNOSIS — M1A9XX0 Chronic gout, unspecified, without tophus (tophi): Secondary | ICD-10-CM | POA: Diagnosis not present

## 2018-01-28 DIAGNOSIS — G894 Chronic pain syndrome: Secondary | ICD-10-CM | POA: Diagnosis not present

## 2018-01-28 DIAGNOSIS — J9621 Acute and chronic respiratory failure with hypoxia: Secondary | ICD-10-CM | POA: Diagnosis not present

## 2018-01-28 DIAGNOSIS — J9622 Acute and chronic respiratory failure with hypercapnia: Secondary | ICD-10-CM | POA: Diagnosis not present

## 2018-01-30 DIAGNOSIS — Z9181 History of falling: Secondary | ICD-10-CM | POA: Diagnosis not present

## 2018-01-30 DIAGNOSIS — Z85528 Personal history of other malignant neoplasm of kidney: Secondary | ICD-10-CM | POA: Diagnosis not present

## 2018-01-30 DIAGNOSIS — J449 Chronic obstructive pulmonary disease, unspecified: Secondary | ICD-10-CM | POA: Diagnosis not present

## 2018-01-30 DIAGNOSIS — Z9981 Dependence on supplemental oxygen: Secondary | ICD-10-CM | POA: Diagnosis not present

## 2018-01-30 DIAGNOSIS — Z87891 Personal history of nicotine dependence: Secondary | ICD-10-CM | POA: Diagnosis not present

## 2018-01-30 DIAGNOSIS — Z8674 Personal history of sudden cardiac arrest: Secondary | ICD-10-CM | POA: Diagnosis not present

## 2018-01-30 DIAGNOSIS — E785 Hyperlipidemia, unspecified: Secondary | ICD-10-CM | POA: Diagnosis not present

## 2018-01-30 DIAGNOSIS — Z7982 Long term (current) use of aspirin: Secondary | ICD-10-CM | POA: Diagnosis not present

## 2018-01-30 DIAGNOSIS — M1A9XX0 Chronic gout, unspecified, without tophus (tophi): Secondary | ICD-10-CM | POA: Diagnosis not present

## 2018-01-30 DIAGNOSIS — G894 Chronic pain syndrome: Secondary | ICD-10-CM | POA: Diagnosis not present

## 2018-01-30 DIAGNOSIS — I130 Hypertensive heart and chronic kidney disease with heart failure and stage 1 through stage 4 chronic kidney disease, or unspecified chronic kidney disease: Secondary | ICD-10-CM | POA: Diagnosis not present

## 2018-01-30 DIAGNOSIS — J9622 Acute and chronic respiratory failure with hypercapnia: Secondary | ICD-10-CM | POA: Diagnosis not present

## 2018-01-30 DIAGNOSIS — J9621 Acute and chronic respiratory failure with hypoxia: Secondary | ICD-10-CM | POA: Diagnosis not present

## 2018-01-30 DIAGNOSIS — E538 Deficiency of other specified B group vitamins: Secondary | ICD-10-CM | POA: Diagnosis not present

## 2018-01-30 DIAGNOSIS — M549 Dorsalgia, unspecified: Secondary | ICD-10-CM | POA: Diagnosis not present

## 2018-01-30 DIAGNOSIS — M1991 Primary osteoarthritis, unspecified site: Secondary | ICD-10-CM | POA: Diagnosis not present

## 2018-01-30 DIAGNOSIS — N183 Chronic kidney disease, stage 3 (moderate): Secondary | ICD-10-CM | POA: Diagnosis not present

## 2018-01-30 DIAGNOSIS — I5033 Acute on chronic diastolic (congestive) heart failure: Secondary | ICD-10-CM | POA: Diagnosis not present

## 2018-01-30 DIAGNOSIS — G4733 Obstructive sleep apnea (adult) (pediatric): Secondary | ICD-10-CM | POA: Diagnosis not present

## 2018-02-04 DIAGNOSIS — G894 Chronic pain syndrome: Secondary | ICD-10-CM | POA: Diagnosis not present

## 2018-02-04 DIAGNOSIS — I130 Hypertensive heart and chronic kidney disease with heart failure and stage 1 through stage 4 chronic kidney disease, or unspecified chronic kidney disease: Secondary | ICD-10-CM | POA: Diagnosis not present

## 2018-02-04 DIAGNOSIS — Z85528 Personal history of other malignant neoplasm of kidney: Secondary | ICD-10-CM | POA: Diagnosis not present

## 2018-02-04 DIAGNOSIS — M549 Dorsalgia, unspecified: Secondary | ICD-10-CM | POA: Diagnosis not present

## 2018-02-04 DIAGNOSIS — J9621 Acute and chronic respiratory failure with hypoxia: Secondary | ICD-10-CM | POA: Diagnosis not present

## 2018-02-04 DIAGNOSIS — I5033 Acute on chronic diastolic (congestive) heart failure: Secondary | ICD-10-CM | POA: Diagnosis not present

## 2018-02-04 DIAGNOSIS — M1A9XX0 Chronic gout, unspecified, without tophus (tophi): Secondary | ICD-10-CM | POA: Diagnosis not present

## 2018-02-04 DIAGNOSIS — Z9181 History of falling: Secondary | ICD-10-CM | POA: Diagnosis not present

## 2018-02-04 DIAGNOSIS — E785 Hyperlipidemia, unspecified: Secondary | ICD-10-CM | POA: Diagnosis not present

## 2018-02-04 DIAGNOSIS — M1991 Primary osteoarthritis, unspecified site: Secondary | ICD-10-CM | POA: Diagnosis not present

## 2018-02-04 DIAGNOSIS — Z9981 Dependence on supplemental oxygen: Secondary | ICD-10-CM | POA: Diagnosis not present

## 2018-02-04 DIAGNOSIS — J9622 Acute and chronic respiratory failure with hypercapnia: Secondary | ICD-10-CM | POA: Diagnosis not present

## 2018-02-04 DIAGNOSIS — Z7982 Long term (current) use of aspirin: Secondary | ICD-10-CM | POA: Diagnosis not present

## 2018-02-04 DIAGNOSIS — E538 Deficiency of other specified B group vitamins: Secondary | ICD-10-CM | POA: Diagnosis not present

## 2018-02-04 DIAGNOSIS — J449 Chronic obstructive pulmonary disease, unspecified: Secondary | ICD-10-CM | POA: Diagnosis not present

## 2018-02-04 DIAGNOSIS — N183 Chronic kidney disease, stage 3 (moderate): Secondary | ICD-10-CM | POA: Diagnosis not present

## 2018-02-04 DIAGNOSIS — G4733 Obstructive sleep apnea (adult) (pediatric): Secondary | ICD-10-CM | POA: Diagnosis not present

## 2018-02-04 DIAGNOSIS — Z87891 Personal history of nicotine dependence: Secondary | ICD-10-CM | POA: Diagnosis not present

## 2018-02-04 DIAGNOSIS — Z8674 Personal history of sudden cardiac arrest: Secondary | ICD-10-CM | POA: Diagnosis not present

## 2018-02-06 DIAGNOSIS — Z1389 Encounter for screening for other disorder: Secondary | ICD-10-CM | POA: Diagnosis not present

## 2018-02-06 DIAGNOSIS — K921 Melena: Secondary | ICD-10-CM | POA: Diagnosis not present

## 2018-02-06 DIAGNOSIS — R7309 Other abnormal glucose: Secondary | ICD-10-CM | POA: Diagnosis not present

## 2018-02-07 DIAGNOSIS — N183 Chronic kidney disease, stage 3 (moderate): Secondary | ICD-10-CM | POA: Diagnosis not present

## 2018-02-07 DIAGNOSIS — E538 Deficiency of other specified B group vitamins: Secondary | ICD-10-CM | POA: Diagnosis not present

## 2018-02-07 DIAGNOSIS — G894 Chronic pain syndrome: Secondary | ICD-10-CM | POA: Diagnosis not present

## 2018-02-07 DIAGNOSIS — M549 Dorsalgia, unspecified: Secondary | ICD-10-CM | POA: Diagnosis not present

## 2018-02-07 DIAGNOSIS — I5033 Acute on chronic diastolic (congestive) heart failure: Secondary | ICD-10-CM | POA: Diagnosis not present

## 2018-02-07 DIAGNOSIS — E785 Hyperlipidemia, unspecified: Secondary | ICD-10-CM | POA: Diagnosis not present

## 2018-02-07 DIAGNOSIS — I130 Hypertensive heart and chronic kidney disease with heart failure and stage 1 through stage 4 chronic kidney disease, or unspecified chronic kidney disease: Secondary | ICD-10-CM | POA: Diagnosis not present

## 2018-02-07 DIAGNOSIS — G4733 Obstructive sleep apnea (adult) (pediatric): Secondary | ICD-10-CM | POA: Diagnosis not present

## 2018-02-07 DIAGNOSIS — M1991 Primary osteoarthritis, unspecified site: Secondary | ICD-10-CM | POA: Diagnosis not present

## 2018-02-07 DIAGNOSIS — Z85528 Personal history of other malignant neoplasm of kidney: Secondary | ICD-10-CM | POA: Diagnosis not present

## 2018-02-07 DIAGNOSIS — Z9181 History of falling: Secondary | ICD-10-CM | POA: Diagnosis not present

## 2018-02-07 DIAGNOSIS — Z8674 Personal history of sudden cardiac arrest: Secondary | ICD-10-CM | POA: Diagnosis not present

## 2018-02-07 DIAGNOSIS — J9622 Acute and chronic respiratory failure with hypercapnia: Secondary | ICD-10-CM | POA: Diagnosis not present

## 2018-02-07 DIAGNOSIS — M1A9XX0 Chronic gout, unspecified, without tophus (tophi): Secondary | ICD-10-CM | POA: Diagnosis not present

## 2018-02-07 DIAGNOSIS — Z7982 Long term (current) use of aspirin: Secondary | ICD-10-CM | POA: Diagnosis not present

## 2018-02-07 DIAGNOSIS — J449 Chronic obstructive pulmonary disease, unspecified: Secondary | ICD-10-CM | POA: Diagnosis not present

## 2018-02-07 DIAGNOSIS — J9621 Acute and chronic respiratory failure with hypoxia: Secondary | ICD-10-CM | POA: Diagnosis not present

## 2018-02-07 DIAGNOSIS — Z9981 Dependence on supplemental oxygen: Secondary | ICD-10-CM | POA: Diagnosis not present

## 2018-02-07 DIAGNOSIS — Z87891 Personal history of nicotine dependence: Secondary | ICD-10-CM | POA: Diagnosis not present

## 2018-02-08 DIAGNOSIS — J9622 Acute and chronic respiratory failure with hypercapnia: Secondary | ICD-10-CM | POA: Diagnosis not present

## 2018-02-08 DIAGNOSIS — J449 Chronic obstructive pulmonary disease, unspecified: Secondary | ICD-10-CM | POA: Diagnosis not present

## 2018-02-08 DIAGNOSIS — I5033 Acute on chronic diastolic (congestive) heart failure: Secondary | ICD-10-CM | POA: Diagnosis not present

## 2018-02-08 DIAGNOSIS — I130 Hypertensive heart and chronic kidney disease with heart failure and stage 1 through stage 4 chronic kidney disease, or unspecified chronic kidney disease: Secondary | ICD-10-CM | POA: Diagnosis not present

## 2018-02-11 DIAGNOSIS — J9610 Chronic respiratory failure, unspecified whether with hypoxia or hypercapnia: Secondary | ICD-10-CM | POA: Diagnosis not present

## 2018-02-13 DIAGNOSIS — N183 Chronic kidney disease, stage 3 (moderate): Secondary | ICD-10-CM | POA: Diagnosis not present

## 2018-02-13 DIAGNOSIS — J9622 Acute and chronic respiratory failure with hypercapnia: Secondary | ICD-10-CM | POA: Diagnosis not present

## 2018-02-13 DIAGNOSIS — Z87891 Personal history of nicotine dependence: Secondary | ICD-10-CM | POA: Diagnosis not present

## 2018-02-13 DIAGNOSIS — Z85528 Personal history of other malignant neoplasm of kidney: Secondary | ICD-10-CM | POA: Diagnosis not present

## 2018-02-13 DIAGNOSIS — M549 Dorsalgia, unspecified: Secondary | ICD-10-CM | POA: Diagnosis not present

## 2018-02-13 DIAGNOSIS — M1A9XX0 Chronic gout, unspecified, without tophus (tophi): Secondary | ICD-10-CM | POA: Diagnosis not present

## 2018-02-13 DIAGNOSIS — Z8674 Personal history of sudden cardiac arrest: Secondary | ICD-10-CM | POA: Diagnosis not present

## 2018-02-13 DIAGNOSIS — E785 Hyperlipidemia, unspecified: Secondary | ICD-10-CM | POA: Diagnosis not present

## 2018-02-13 DIAGNOSIS — J449 Chronic obstructive pulmonary disease, unspecified: Secondary | ICD-10-CM | POA: Diagnosis not present

## 2018-02-13 DIAGNOSIS — J9621 Acute and chronic respiratory failure with hypoxia: Secondary | ICD-10-CM | POA: Diagnosis not present

## 2018-02-13 DIAGNOSIS — E538 Deficiency of other specified B group vitamins: Secondary | ICD-10-CM | POA: Diagnosis not present

## 2018-02-13 DIAGNOSIS — Z9981 Dependence on supplemental oxygen: Secondary | ICD-10-CM | POA: Diagnosis not present

## 2018-02-13 DIAGNOSIS — G894 Chronic pain syndrome: Secondary | ICD-10-CM | POA: Diagnosis not present

## 2018-02-13 DIAGNOSIS — Z9181 History of falling: Secondary | ICD-10-CM | POA: Diagnosis not present

## 2018-02-13 DIAGNOSIS — Z7982 Long term (current) use of aspirin: Secondary | ICD-10-CM | POA: Diagnosis not present

## 2018-02-13 DIAGNOSIS — M1991 Primary osteoarthritis, unspecified site: Secondary | ICD-10-CM | POA: Diagnosis not present

## 2018-02-13 DIAGNOSIS — I130 Hypertensive heart and chronic kidney disease with heart failure and stage 1 through stage 4 chronic kidney disease, or unspecified chronic kidney disease: Secondary | ICD-10-CM | POA: Diagnosis not present

## 2018-02-13 DIAGNOSIS — I5033 Acute on chronic diastolic (congestive) heart failure: Secondary | ICD-10-CM | POA: Diagnosis not present

## 2018-02-13 DIAGNOSIS — G4733 Obstructive sleep apnea (adult) (pediatric): Secondary | ICD-10-CM | POA: Diagnosis not present

## 2018-02-15 DIAGNOSIS — J9621 Acute and chronic respiratory failure with hypoxia: Secondary | ICD-10-CM | POA: Diagnosis not present

## 2018-02-15 DIAGNOSIS — Z7982 Long term (current) use of aspirin: Secondary | ICD-10-CM | POA: Diagnosis not present

## 2018-02-15 DIAGNOSIS — E785 Hyperlipidemia, unspecified: Secondary | ICD-10-CM | POA: Diagnosis not present

## 2018-02-15 DIAGNOSIS — G4733 Obstructive sleep apnea (adult) (pediatric): Secondary | ICD-10-CM | POA: Diagnosis not present

## 2018-02-15 DIAGNOSIS — M549 Dorsalgia, unspecified: Secondary | ICD-10-CM | POA: Diagnosis not present

## 2018-02-15 DIAGNOSIS — G894 Chronic pain syndrome: Secondary | ICD-10-CM | POA: Diagnosis not present

## 2018-02-15 DIAGNOSIS — N183 Chronic kidney disease, stage 3 (moderate): Secondary | ICD-10-CM | POA: Diagnosis not present

## 2018-02-15 DIAGNOSIS — J9622 Acute and chronic respiratory failure with hypercapnia: Secondary | ICD-10-CM | POA: Diagnosis not present

## 2018-02-15 DIAGNOSIS — I130 Hypertensive heart and chronic kidney disease with heart failure and stage 1 through stage 4 chronic kidney disease, or unspecified chronic kidney disease: Secondary | ICD-10-CM | POA: Diagnosis not present

## 2018-02-15 DIAGNOSIS — Z9181 History of falling: Secondary | ICD-10-CM | POA: Diagnosis not present

## 2018-02-15 DIAGNOSIS — M1A9XX0 Chronic gout, unspecified, without tophus (tophi): Secondary | ICD-10-CM | POA: Diagnosis not present

## 2018-02-15 DIAGNOSIS — Z9981 Dependence on supplemental oxygen: Secondary | ICD-10-CM | POA: Diagnosis not present

## 2018-02-15 DIAGNOSIS — I5033 Acute on chronic diastolic (congestive) heart failure: Secondary | ICD-10-CM | POA: Diagnosis not present

## 2018-02-15 DIAGNOSIS — Z87891 Personal history of nicotine dependence: Secondary | ICD-10-CM | POA: Diagnosis not present

## 2018-02-15 DIAGNOSIS — M1991 Primary osteoarthritis, unspecified site: Secondary | ICD-10-CM | POA: Diagnosis not present

## 2018-02-15 DIAGNOSIS — E538 Deficiency of other specified B group vitamins: Secondary | ICD-10-CM | POA: Diagnosis not present

## 2018-02-15 DIAGNOSIS — Z8674 Personal history of sudden cardiac arrest: Secondary | ICD-10-CM | POA: Diagnosis not present

## 2018-02-15 DIAGNOSIS — Z85528 Personal history of other malignant neoplasm of kidney: Secondary | ICD-10-CM | POA: Diagnosis not present

## 2018-02-15 DIAGNOSIS — J449 Chronic obstructive pulmonary disease, unspecified: Secondary | ICD-10-CM | POA: Diagnosis not present

## 2018-02-18 DIAGNOSIS — Z9981 Dependence on supplemental oxygen: Secondary | ICD-10-CM | POA: Diagnosis not present

## 2018-02-18 DIAGNOSIS — G894 Chronic pain syndrome: Secondary | ICD-10-CM | POA: Diagnosis not present

## 2018-02-18 DIAGNOSIS — G4733 Obstructive sleep apnea (adult) (pediatric): Secondary | ICD-10-CM | POA: Diagnosis not present

## 2018-02-18 DIAGNOSIS — M1991 Primary osteoarthritis, unspecified site: Secondary | ICD-10-CM | POA: Diagnosis not present

## 2018-02-18 DIAGNOSIS — Z7982 Long term (current) use of aspirin: Secondary | ICD-10-CM | POA: Diagnosis not present

## 2018-02-18 DIAGNOSIS — J449 Chronic obstructive pulmonary disease, unspecified: Secondary | ICD-10-CM | POA: Diagnosis not present

## 2018-02-18 DIAGNOSIS — E538 Deficiency of other specified B group vitamins: Secondary | ICD-10-CM | POA: Diagnosis not present

## 2018-02-18 DIAGNOSIS — M1A9XX0 Chronic gout, unspecified, without tophus (tophi): Secondary | ICD-10-CM | POA: Diagnosis not present

## 2018-02-18 DIAGNOSIS — I5033 Acute on chronic diastolic (congestive) heart failure: Secondary | ICD-10-CM | POA: Diagnosis not present

## 2018-02-18 DIAGNOSIS — J9622 Acute and chronic respiratory failure with hypercapnia: Secondary | ICD-10-CM | POA: Diagnosis not present

## 2018-02-18 DIAGNOSIS — Z85528 Personal history of other malignant neoplasm of kidney: Secondary | ICD-10-CM | POA: Diagnosis not present

## 2018-02-18 DIAGNOSIS — Z9181 History of falling: Secondary | ICD-10-CM | POA: Diagnosis not present

## 2018-02-18 DIAGNOSIS — Z87891 Personal history of nicotine dependence: Secondary | ICD-10-CM | POA: Diagnosis not present

## 2018-02-18 DIAGNOSIS — E785 Hyperlipidemia, unspecified: Secondary | ICD-10-CM | POA: Diagnosis not present

## 2018-02-18 DIAGNOSIS — I130 Hypertensive heart and chronic kidney disease with heart failure and stage 1 through stage 4 chronic kidney disease, or unspecified chronic kidney disease: Secondary | ICD-10-CM | POA: Diagnosis not present

## 2018-02-18 DIAGNOSIS — N183 Chronic kidney disease, stage 3 (moderate): Secondary | ICD-10-CM | POA: Diagnosis not present

## 2018-02-18 DIAGNOSIS — Z8674 Personal history of sudden cardiac arrest: Secondary | ICD-10-CM | POA: Diagnosis not present

## 2018-02-18 DIAGNOSIS — J9621 Acute and chronic respiratory failure with hypoxia: Secondary | ICD-10-CM | POA: Diagnosis not present

## 2018-02-18 DIAGNOSIS — M549 Dorsalgia, unspecified: Secondary | ICD-10-CM | POA: Diagnosis not present

## 2018-02-21 DIAGNOSIS — Z87891 Personal history of nicotine dependence: Secondary | ICD-10-CM | POA: Diagnosis not present

## 2018-02-21 DIAGNOSIS — G4733 Obstructive sleep apnea (adult) (pediatric): Secondary | ICD-10-CM | POA: Diagnosis not present

## 2018-02-21 DIAGNOSIS — I5033 Acute on chronic diastolic (congestive) heart failure: Secondary | ICD-10-CM | POA: Diagnosis not present

## 2018-02-21 DIAGNOSIS — Z85528 Personal history of other malignant neoplasm of kidney: Secondary | ICD-10-CM | POA: Diagnosis not present

## 2018-02-21 DIAGNOSIS — N183 Chronic kidney disease, stage 3 (moderate): Secondary | ICD-10-CM | POA: Diagnosis not present

## 2018-02-21 DIAGNOSIS — G894 Chronic pain syndrome: Secondary | ICD-10-CM | POA: Diagnosis not present

## 2018-02-21 DIAGNOSIS — E785 Hyperlipidemia, unspecified: Secondary | ICD-10-CM | POA: Diagnosis not present

## 2018-02-21 DIAGNOSIS — M1A9XX0 Chronic gout, unspecified, without tophus (tophi): Secondary | ICD-10-CM | POA: Diagnosis not present

## 2018-02-21 DIAGNOSIS — Z7982 Long term (current) use of aspirin: Secondary | ICD-10-CM | POA: Diagnosis not present

## 2018-02-21 DIAGNOSIS — Z9981 Dependence on supplemental oxygen: Secondary | ICD-10-CM | POA: Diagnosis not present

## 2018-02-21 DIAGNOSIS — J9622 Acute and chronic respiratory failure with hypercapnia: Secondary | ICD-10-CM | POA: Diagnosis not present

## 2018-02-21 DIAGNOSIS — J449 Chronic obstructive pulmonary disease, unspecified: Secondary | ICD-10-CM | POA: Diagnosis not present

## 2018-02-21 DIAGNOSIS — E538 Deficiency of other specified B group vitamins: Secondary | ICD-10-CM | POA: Diagnosis not present

## 2018-02-21 DIAGNOSIS — Z9181 History of falling: Secondary | ICD-10-CM | POA: Diagnosis not present

## 2018-02-21 DIAGNOSIS — M549 Dorsalgia, unspecified: Secondary | ICD-10-CM | POA: Diagnosis not present

## 2018-02-21 DIAGNOSIS — I130 Hypertensive heart and chronic kidney disease with heart failure and stage 1 through stage 4 chronic kidney disease, or unspecified chronic kidney disease: Secondary | ICD-10-CM | POA: Diagnosis not present

## 2018-02-21 DIAGNOSIS — J9621 Acute and chronic respiratory failure with hypoxia: Secondary | ICD-10-CM | POA: Diagnosis not present

## 2018-02-21 DIAGNOSIS — Z8674 Personal history of sudden cardiac arrest: Secondary | ICD-10-CM | POA: Diagnosis not present

## 2018-02-21 DIAGNOSIS — M1991 Primary osteoarthritis, unspecified site: Secondary | ICD-10-CM | POA: Diagnosis not present

## 2018-02-25 DIAGNOSIS — Z8674 Personal history of sudden cardiac arrest: Secondary | ICD-10-CM | POA: Diagnosis not present

## 2018-02-25 DIAGNOSIS — G4733 Obstructive sleep apnea (adult) (pediatric): Secondary | ICD-10-CM | POA: Diagnosis not present

## 2018-02-25 DIAGNOSIS — Z7982 Long term (current) use of aspirin: Secondary | ICD-10-CM | POA: Diagnosis not present

## 2018-02-25 DIAGNOSIS — E785 Hyperlipidemia, unspecified: Secondary | ICD-10-CM | POA: Diagnosis not present

## 2018-02-25 DIAGNOSIS — G894 Chronic pain syndrome: Secondary | ICD-10-CM | POA: Diagnosis not present

## 2018-02-25 DIAGNOSIS — I130 Hypertensive heart and chronic kidney disease with heart failure and stage 1 through stage 4 chronic kidney disease, or unspecified chronic kidney disease: Secondary | ICD-10-CM | POA: Diagnosis not present

## 2018-02-25 DIAGNOSIS — E538 Deficiency of other specified B group vitamins: Secondary | ICD-10-CM | POA: Diagnosis not present

## 2018-02-25 DIAGNOSIS — Z87891 Personal history of nicotine dependence: Secondary | ICD-10-CM | POA: Diagnosis not present

## 2018-02-25 DIAGNOSIS — J449 Chronic obstructive pulmonary disease, unspecified: Secondary | ICD-10-CM | POA: Diagnosis not present

## 2018-02-25 DIAGNOSIS — M1991 Primary osteoarthritis, unspecified site: Secondary | ICD-10-CM | POA: Diagnosis not present

## 2018-02-25 DIAGNOSIS — M1A9XX0 Chronic gout, unspecified, without tophus (tophi): Secondary | ICD-10-CM | POA: Diagnosis not present

## 2018-02-25 DIAGNOSIS — Z85528 Personal history of other malignant neoplasm of kidney: Secondary | ICD-10-CM | POA: Diagnosis not present

## 2018-02-25 DIAGNOSIS — I5033 Acute on chronic diastolic (congestive) heart failure: Secondary | ICD-10-CM | POA: Diagnosis not present

## 2018-02-25 DIAGNOSIS — J9622 Acute and chronic respiratory failure with hypercapnia: Secondary | ICD-10-CM | POA: Diagnosis not present

## 2018-02-25 DIAGNOSIS — Z9981 Dependence on supplemental oxygen: Secondary | ICD-10-CM | POA: Diagnosis not present

## 2018-02-25 DIAGNOSIS — Z9181 History of falling: Secondary | ICD-10-CM | POA: Diagnosis not present

## 2018-02-25 DIAGNOSIS — N183 Chronic kidney disease, stage 3 (moderate): Secondary | ICD-10-CM | POA: Diagnosis not present

## 2018-02-25 DIAGNOSIS — J9621 Acute and chronic respiratory failure with hypoxia: Secondary | ICD-10-CM | POA: Diagnosis not present

## 2018-02-25 DIAGNOSIS — M549 Dorsalgia, unspecified: Secondary | ICD-10-CM | POA: Diagnosis not present

## 2018-03-19 DIAGNOSIS — K921 Melena: Secondary | ICD-10-CM | POA: Diagnosis not present

## 2018-03-19 DIAGNOSIS — D649 Anemia, unspecified: Secondary | ICD-10-CM | POA: Diagnosis not present

## 2018-04-08 DIAGNOSIS — I4892 Unspecified atrial flutter: Secondary | ICD-10-CM | POA: Diagnosis not present

## 2018-04-08 DIAGNOSIS — Z4509 Encounter for adjustment and management of other cardiac device: Secondary | ICD-10-CM | POA: Diagnosis not present

## 2018-04-14 DIAGNOSIS — J9610 Chronic respiratory failure, unspecified whether with hypoxia or hypercapnia: Secondary | ICD-10-CM | POA: Diagnosis not present

## 2018-05-13 DIAGNOSIS — J9610 Chronic respiratory failure, unspecified whether with hypoxia or hypercapnia: Secondary | ICD-10-CM | POA: Diagnosis not present

## 2018-05-23 ENCOUNTER — Other Ambulatory Visit: Payer: Self-pay

## 2018-05-23 NOTE — Patient Outreach (Signed)
[facility]) Care Management  05/23/2018  BONNIE ROIG 03/08/1945 [id number]  Medication Adherence call to Mr. Jakob Kimberlin HIPPA Compliant Voice message left with a call back number.patien is due on Atorvastatin 20 mg under [place].   [place] Management Direct Dial [phone]  Fax [phone] [email or web address]

## 2018-06-13 DIAGNOSIS — C649 Malignant neoplasm of unspecified kidney, except renal pelvis: Secondary | ICD-10-CM | POA: Diagnosis not present

## 2018-06-13 DIAGNOSIS — J9610 Chronic respiratory failure, unspecified whether with hypoxia or hypercapnia: Secondary | ICD-10-CM | POA: Diagnosis not present

## 2018-06-26 DIAGNOSIS — I129 Hypertensive chronic kidney disease with stage 1 through stage 4 chronic kidney disease, or unspecified chronic kidney disease: Secondary | ICD-10-CM | POA: Diagnosis not present

## 2018-06-26 DIAGNOSIS — M109 Gout, unspecified: Secondary | ICD-10-CM | POA: Diagnosis not present

## 2018-06-26 DIAGNOSIS — R801 Persistent proteinuria, unspecified: Secondary | ICD-10-CM | POA: Diagnosis not present

## 2018-06-26 DIAGNOSIS — D631 Anemia in chronic kidney disease: Secondary | ICD-10-CM | POA: Diagnosis not present

## 2018-06-26 DIAGNOSIS — N183 Chronic kidney disease, stage 3 (moderate): Secondary | ICD-10-CM | POA: Diagnosis not present

## 2018-07-13 DIAGNOSIS — C649 Malignant neoplasm of unspecified kidney, except renal pelvis: Secondary | ICD-10-CM | POA: Diagnosis not present

## 2018-07-13 DIAGNOSIS — J9610 Chronic respiratory failure, unspecified whether with hypoxia or hypercapnia: Secondary | ICD-10-CM | POA: Diagnosis not present

## 2018-07-26 DIAGNOSIS — Z1389 Encounter for screening for other disorder: Secondary | ICD-10-CM | POA: Diagnosis not present

## 2018-07-26 DIAGNOSIS — R7309 Other abnormal glucose: Secondary | ICD-10-CM | POA: Diagnosis not present

## 2018-07-26 DIAGNOSIS — Z0001 Encounter for general adult medical examination with abnormal findings: Secondary | ICD-10-CM | POA: Diagnosis not present

## 2018-07-26 DIAGNOSIS — J9611 Chronic respiratory failure with hypoxia: Secondary | ICD-10-CM | POA: Diagnosis not present

## 2018-07-26 DIAGNOSIS — G894 Chronic pain syndrome: Secondary | ICD-10-CM | POA: Diagnosis not present

## 2018-08-13 DIAGNOSIS — C649 Malignant neoplasm of unspecified kidney, except renal pelvis: Secondary | ICD-10-CM | POA: Diagnosis not present

## 2018-08-13 DIAGNOSIS — J9610 Chronic respiratory failure, unspecified whether with hypoxia or hypercapnia: Secondary | ICD-10-CM | POA: Diagnosis not present

## 2018-09-12 DIAGNOSIS — C649 Malignant neoplasm of unspecified kidney, except renal pelvis: Secondary | ICD-10-CM | POA: Diagnosis not present

## 2018-09-12 DIAGNOSIS — J9610 Chronic respiratory failure, unspecified whether with hypoxia or hypercapnia: Secondary | ICD-10-CM | POA: Diagnosis not present

## 2018-10-13 DIAGNOSIS — J9610 Chronic respiratory failure, unspecified whether with hypoxia or hypercapnia: Secondary | ICD-10-CM | POA: Diagnosis not present

## 2018-10-13 DIAGNOSIS — C649 Malignant neoplasm of unspecified kidney, except renal pelvis: Secondary | ICD-10-CM | POA: Diagnosis not present

## 2018-10-14 DIAGNOSIS — I4892 Unspecified atrial flutter: Secondary | ICD-10-CM | POA: Diagnosis not present

## 2018-10-14 DIAGNOSIS — I4891 Unspecified atrial fibrillation: Secondary | ICD-10-CM | POA: Diagnosis not present

## 2018-10-14 DIAGNOSIS — Z95818 Presence of other cardiac implants and grafts: Secondary | ICD-10-CM | POA: Diagnosis not present

## 2018-10-14 DIAGNOSIS — Z4509 Encounter for adjustment and management of other cardiac device: Secondary | ICD-10-CM | POA: Diagnosis not present

## 2018-11-13 DIAGNOSIS — J9610 Chronic respiratory failure, unspecified whether with hypoxia or hypercapnia: Secondary | ICD-10-CM | POA: Diagnosis not present

## 2018-11-13 DIAGNOSIS — C649 Malignant neoplasm of unspecified kidney, except renal pelvis: Secondary | ICD-10-CM | POA: Diagnosis not present

## 2018-11-27 DIAGNOSIS — M1991 Primary osteoarthritis, unspecified site: Secondary | ICD-10-CM | POA: Diagnosis not present

## 2018-11-27 DIAGNOSIS — I10 Essential (primary) hypertension: Secondary | ICD-10-CM | POA: Diagnosis not present

## 2018-11-27 DIAGNOSIS — E782 Mixed hyperlipidemia: Secondary | ICD-10-CM | POA: Diagnosis not present

## 2018-11-27 DIAGNOSIS — I509 Heart failure, unspecified: Secondary | ICD-10-CM | POA: Diagnosis not present

## 2018-12-13 DIAGNOSIS — J9610 Chronic respiratory failure, unspecified whether with hypoxia or hypercapnia: Secondary | ICD-10-CM | POA: Diagnosis not present

## 2018-12-13 DIAGNOSIS — C649 Malignant neoplasm of unspecified kidney, except renal pelvis: Secondary | ICD-10-CM | POA: Diagnosis not present

## 2018-12-27 DIAGNOSIS — I5033 Acute on chronic diastolic (congestive) heart failure: Secondary | ICD-10-CM | POA: Diagnosis not present

## 2018-12-27 DIAGNOSIS — I130 Hypertensive heart and chronic kidney disease with heart failure and stage 1 through stage 4 chronic kidney disease, or unspecified chronic kidney disease: Secondary | ICD-10-CM | POA: Diagnosis not present

## 2018-12-27 DIAGNOSIS — N184 Chronic kidney disease, stage 4 (severe): Secondary | ICD-10-CM | POA: Diagnosis not present

## 2018-12-27 DIAGNOSIS — E7849 Other hyperlipidemia: Secondary | ICD-10-CM | POA: Diagnosis not present

## 2019-01-13 DIAGNOSIS — C649 Malignant neoplasm of unspecified kidney, except renal pelvis: Secondary | ICD-10-CM | POA: Diagnosis not present

## 2019-01-13 DIAGNOSIS — J9610 Chronic respiratory failure, unspecified whether with hypoxia or hypercapnia: Secondary | ICD-10-CM | POA: Diagnosis not present

## 2019-01-17 IMAGING — US US RENAL
1 series · 14 of 25 positions shown · non-contrast
Comparison: Ultrasound 12/04/2011 .  CT 08/31/2007, 04/18/2005.

CLINICAL DATA: Chronic renal disease.

EXAM:
RENAL / URINARY TRACT ULTRASOUND COMPLETE

[Series 1: us renal · 0.22mm/px · 14 of 60 slices shown]
[im 1/60]
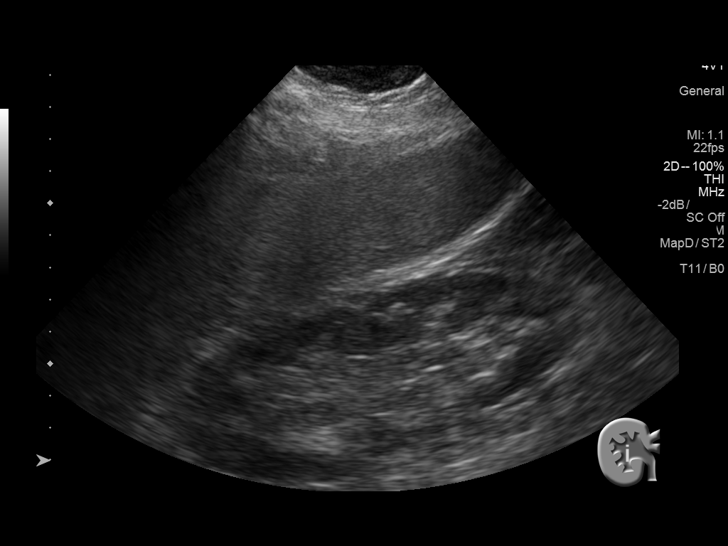
[im 5/60]
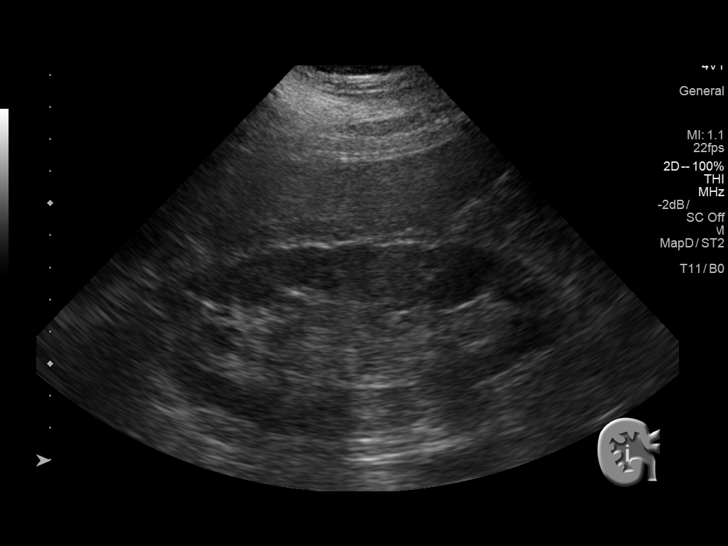
[im 10/60]
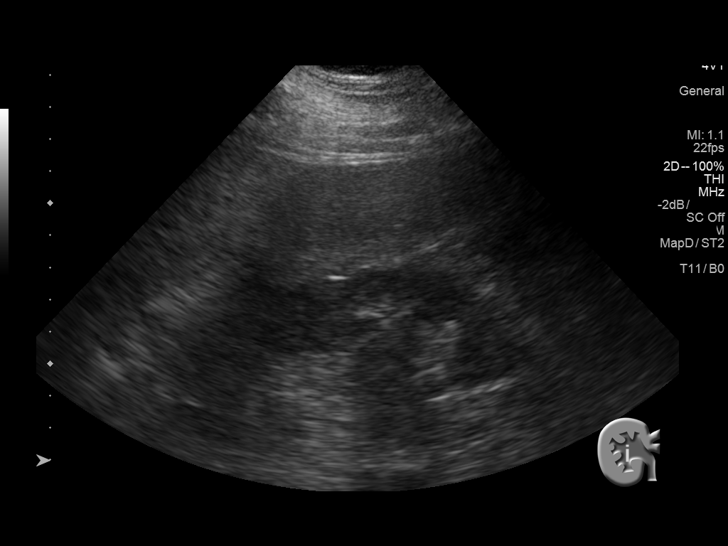
[im 15/60]
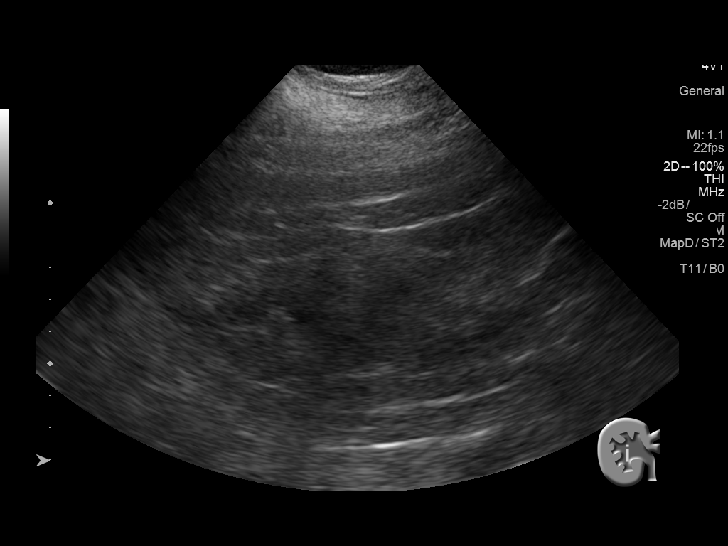
[im 20/60]
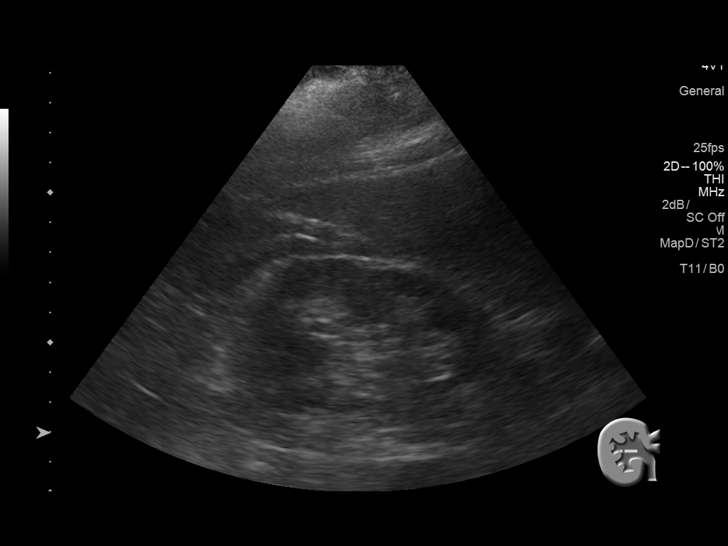
[im 23/60]
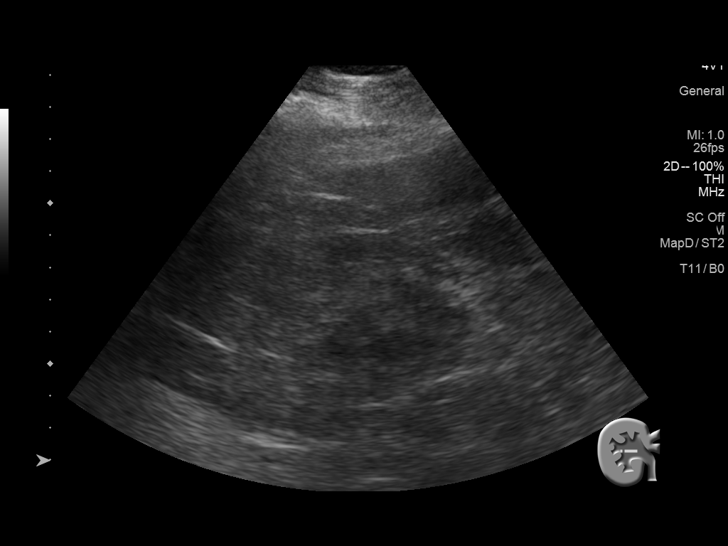
[im 28/60]
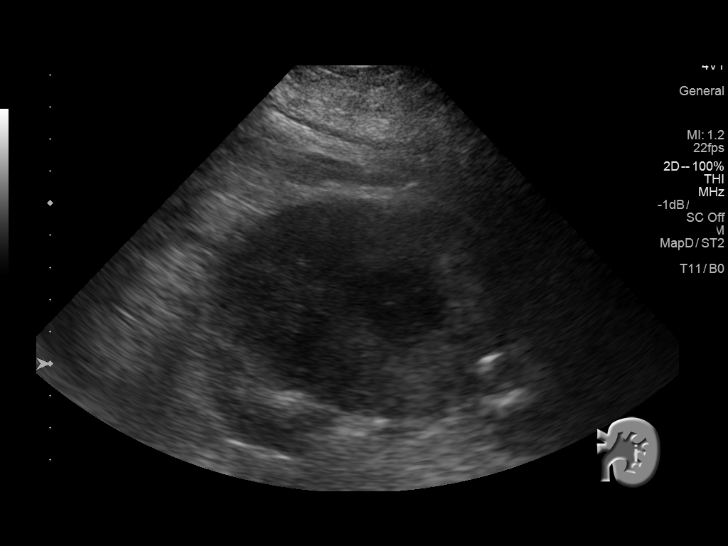
[im 32/60]
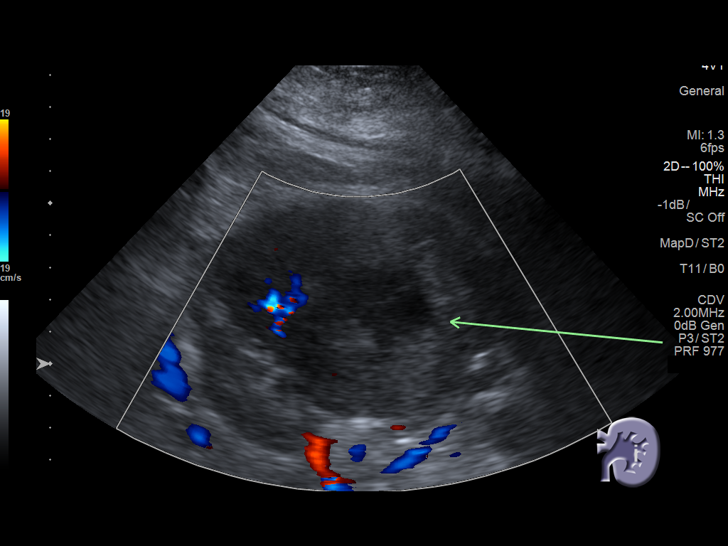
[im 37/60]
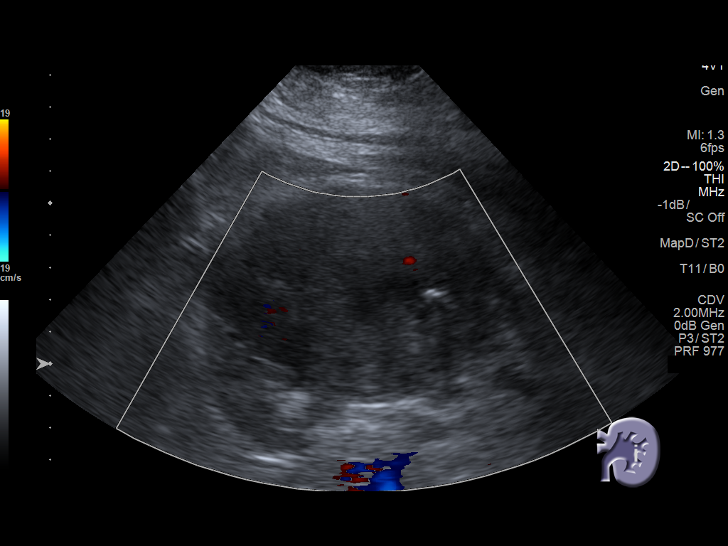
[im 40/60]
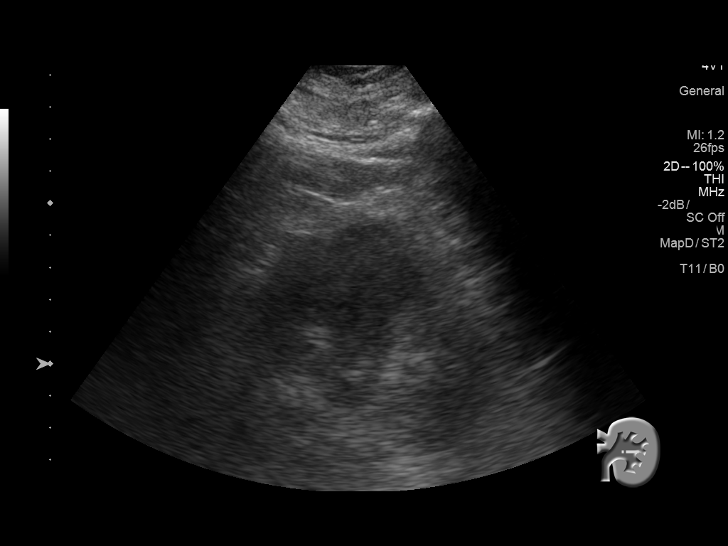
[im 45/60]
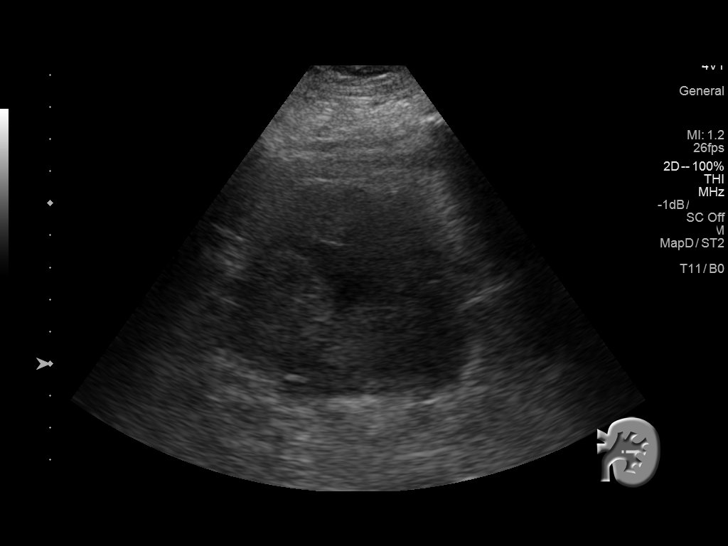
[im 50/60]
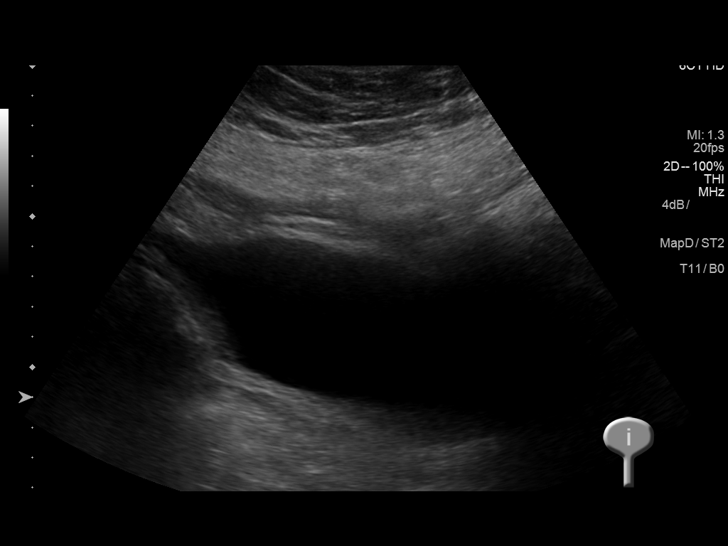
[im 55/60]
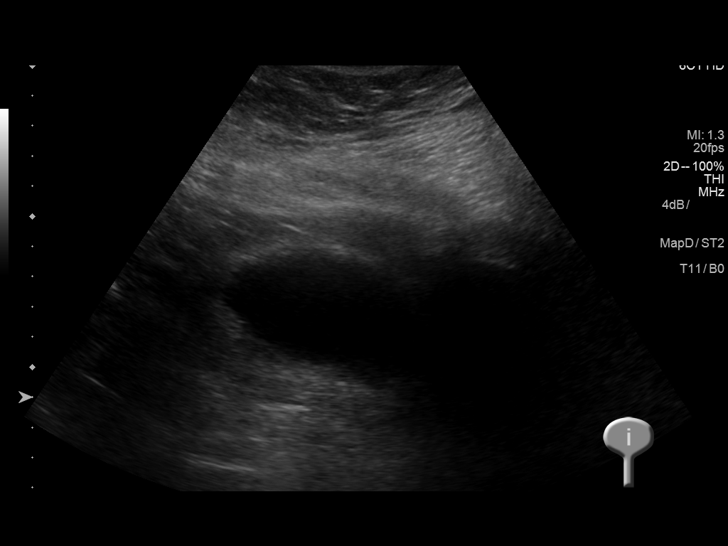
[im 60/60]
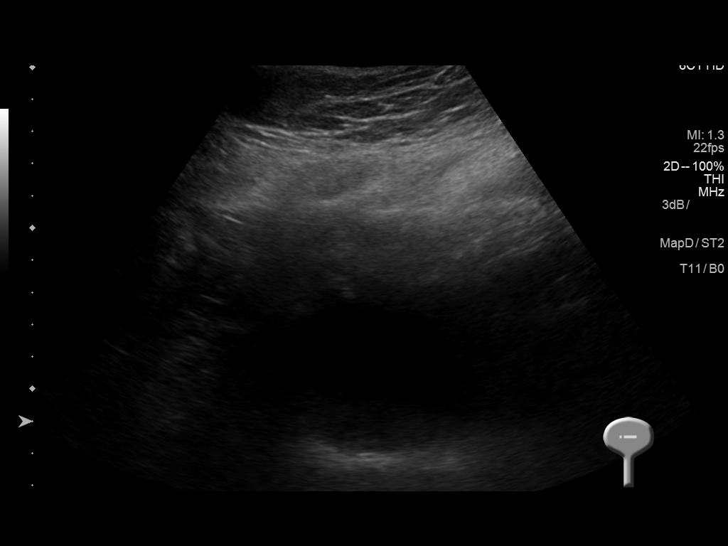

[14 of 25 positions shown; findings below may reference images not displayed]

FINDINGS: Right Kidney:

Length: 12.7 cm. Cortical irregularity suggesting scarring.
Echogenicity within normal limits. No mass or hydronephrosis
visualized.

Left Kidney:

Length: 12.1 cm. Echogenicity within normal limits. No mass.
Ill-defined cystic process is in the central portion of the left
kidney. This could represent mild renal pelvic fullness/
hydronephrosis. A cystic renal mass could also present this fashion.

Bladder:

Appears normal for degree of bladder distention. Increased liver
echogenicity is incidentally noted. This suggests fatty infiltration
and/or hepatocellular disease.
IMPRESSION: 1. Ill-defined cystic process in the central portion left kidney.
This could represent mild renal pelvic fullness/ hydronephrosis.
Cystic renal mass cannot be excluded. Renal MRI can be obtained to
further evaluate.

2. Right renal cortical thinning and irregularity suggesting
scarring.

3. Increased hepatic echogenicity incidentally noted. This is
consistent with fatty infiltration and/or hepatocellular disease.

## 2019-01-27 DIAGNOSIS — E7849 Other hyperlipidemia: Secondary | ICD-10-CM | POA: Diagnosis not present

## 2019-01-27 DIAGNOSIS — I509 Heart failure, unspecified: Secondary | ICD-10-CM | POA: Diagnosis not present

## 2019-01-27 DIAGNOSIS — I130 Hypertensive heart and chronic kidney disease with heart failure and stage 1 through stage 4 chronic kidney disease, or unspecified chronic kidney disease: Secondary | ICD-10-CM | POA: Diagnosis not present

## 2019-01-27 DIAGNOSIS — N184 Chronic kidney disease, stage 4 (severe): Secondary | ICD-10-CM | POA: Diagnosis not present

## 2019-02-12 DIAGNOSIS — J9610 Chronic respiratory failure, unspecified whether with hypoxia or hypercapnia: Secondary | ICD-10-CM | POA: Diagnosis not present

## 2019-02-12 DIAGNOSIS — C649 Malignant neoplasm of unspecified kidney, except renal pelvis: Secondary | ICD-10-CM | POA: Diagnosis not present

## 2019-02-27 DIAGNOSIS — E7849 Other hyperlipidemia: Secondary | ICD-10-CM | POA: Diagnosis not present

## 2019-02-27 DIAGNOSIS — I509 Heart failure, unspecified: Secondary | ICD-10-CM | POA: Diagnosis not present

## 2019-02-27 DIAGNOSIS — N184 Chronic kidney disease, stage 4 (severe): Secondary | ICD-10-CM | POA: Diagnosis not present

## 2019-02-27 DIAGNOSIS — I130 Hypertensive heart and chronic kidney disease with heart failure and stage 1 through stage 4 chronic kidney disease, or unspecified chronic kidney disease: Secondary | ICD-10-CM | POA: Diagnosis not present

## 2019-03-15 DIAGNOSIS — J9610 Chronic respiratory failure, unspecified whether with hypoxia or hypercapnia: Secondary | ICD-10-CM | POA: Diagnosis not present

## 2019-03-15 DIAGNOSIS — C649 Malignant neoplasm of unspecified kidney, except renal pelvis: Secondary | ICD-10-CM | POA: Diagnosis not present

## 2019-04-15 DIAGNOSIS — C649 Malignant neoplasm of unspecified kidney, except renal pelvis: Secondary | ICD-10-CM | POA: Diagnosis not present

## 2019-04-15 DIAGNOSIS — J9610 Chronic respiratory failure, unspecified whether with hypoxia or hypercapnia: Secondary | ICD-10-CM | POA: Diagnosis not present

## 2019-05-05 DIAGNOSIS — Z95818 Presence of other cardiac implants and grafts: Secondary | ICD-10-CM | POA: Diagnosis not present

## 2019-05-05 DIAGNOSIS — I499 Cardiac arrhythmia, unspecified: Secondary | ICD-10-CM | POA: Diagnosis not present

## 2019-05-13 DIAGNOSIS — C649 Malignant neoplasm of unspecified kidney, except renal pelvis: Secondary | ICD-10-CM | POA: Diagnosis not present

## 2019-05-13 DIAGNOSIS — J9610 Chronic respiratory failure, unspecified whether with hypoxia or hypercapnia: Secondary | ICD-10-CM | POA: Diagnosis not present

## 2019-06-13 DIAGNOSIS — C649 Malignant neoplasm of unspecified kidney, except renal pelvis: Secondary | ICD-10-CM | POA: Diagnosis not present

## 2019-06-13 DIAGNOSIS — J9610 Chronic respiratory failure, unspecified whether with hypoxia or hypercapnia: Secondary | ICD-10-CM | POA: Diagnosis not present

## 2019-06-27 DIAGNOSIS — E7849 Other hyperlipidemia: Secondary | ICD-10-CM | POA: Diagnosis not present

## 2019-06-27 DIAGNOSIS — I130 Hypertensive heart and chronic kidney disease with heart failure and stage 1 through stage 4 chronic kidney disease, or unspecified chronic kidney disease: Secondary | ICD-10-CM | POA: Diagnosis not present

## 2019-06-27 DIAGNOSIS — I509 Heart failure, unspecified: Secondary | ICD-10-CM | POA: Diagnosis not present

## 2019-06-27 DIAGNOSIS — N184 Chronic kidney disease, stage 4 (severe): Secondary | ICD-10-CM | POA: Diagnosis not present

## 2019-06-30 DIAGNOSIS — M1990 Unspecified osteoarthritis, unspecified site: Secondary | ICD-10-CM | POA: Diagnosis not present

## 2019-06-30 DIAGNOSIS — I509 Heart failure, unspecified: Secondary | ICD-10-CM | POA: Diagnosis not present

## 2019-06-30 DIAGNOSIS — I130 Hypertensive heart and chronic kidney disease with heart failure and stage 1 through stage 4 chronic kidney disease, or unspecified chronic kidney disease: Secondary | ICD-10-CM | POA: Diagnosis not present

## 2019-06-30 DIAGNOSIS — I472 Ventricular tachycardia: Secondary | ICD-10-CM | POA: Diagnosis not present

## 2019-06-30 DIAGNOSIS — C641 Malignant neoplasm of right kidney, except renal pelvis: Secondary | ICD-10-CM | POA: Diagnosis not present

## 2019-07-13 DIAGNOSIS — J9610 Chronic respiratory failure, unspecified whether with hypoxia or hypercapnia: Secondary | ICD-10-CM | POA: Diagnosis not present

## 2019-07-13 DIAGNOSIS — C649 Malignant neoplasm of unspecified kidney, except renal pelvis: Secondary | ICD-10-CM | POA: Diagnosis not present

## 2019-07-16 DIAGNOSIS — I499 Cardiac arrhythmia, unspecified: Secondary | ICD-10-CM | POA: Diagnosis not present

## 2019-07-16 DIAGNOSIS — Z95818 Presence of other cardiac implants and grafts: Secondary | ICD-10-CM | POA: Diagnosis not present

## 2019-07-16 DIAGNOSIS — Z4509 Encounter for adjustment and management of other cardiac device: Secondary | ICD-10-CM | POA: Diagnosis not present

## 2019-07-16 DIAGNOSIS — I4892 Unspecified atrial flutter: Secondary | ICD-10-CM | POA: Diagnosis not present

## 2019-07-20 DIAGNOSIS — M109 Gout, unspecified: Secondary | ICD-10-CM | POA: Diagnosis not present

## 2019-07-20 DIAGNOSIS — I130 Hypertensive heart and chronic kidney disease with heart failure and stage 1 through stage 4 chronic kidney disease, or unspecified chronic kidney disease: Secondary | ICD-10-CM | POA: Diagnosis not present

## 2019-07-20 DIAGNOSIS — N183 Chronic kidney disease, stage 3 unspecified: Secondary | ICD-10-CM | POA: Diagnosis not present

## 2019-07-20 DIAGNOSIS — E538 Deficiency of other specified B group vitamins: Secondary | ICD-10-CM | POA: Diagnosis not present

## 2019-07-20 DIAGNOSIS — E785 Hyperlipidemia, unspecified: Secondary | ICD-10-CM | POA: Diagnosis not present

## 2019-07-20 DIAGNOSIS — K529 Noninfective gastroenteritis and colitis, unspecified: Secondary | ICD-10-CM | POA: Diagnosis not present

## 2019-07-20 DIAGNOSIS — Z7401 Bed confinement status: Secondary | ICD-10-CM | POA: Diagnosis not present

## 2019-07-20 DIAGNOSIS — Z9981 Dependence on supplemental oxygen: Secondary | ICD-10-CM | POA: Diagnosis not present

## 2019-07-20 DIAGNOSIS — M1990 Unspecified osteoarthritis, unspecified site: Secondary | ICD-10-CM | POA: Diagnosis not present

## 2019-07-20 DIAGNOSIS — J9622 Acute and chronic respiratory failure with hypercapnia: Secondary | ICD-10-CM | POA: Diagnosis not present

## 2019-07-20 DIAGNOSIS — I509 Heart failure, unspecified: Secondary | ICD-10-CM | POA: Diagnosis not present

## 2019-07-20 DIAGNOSIS — G4733 Obstructive sleep apnea (adult) (pediatric): Secondary | ICD-10-CM | POA: Diagnosis not present

## 2019-07-20 DIAGNOSIS — J9621 Acute and chronic respiratory failure with hypoxia: Secondary | ICD-10-CM | POA: Diagnosis not present

## 2019-07-20 DIAGNOSIS — R300 Dysuria: Secondary | ICD-10-CM | POA: Diagnosis not present

## 2019-07-20 DIAGNOSIS — Z905 Acquired absence of kidney: Secondary | ICD-10-CM | POA: Diagnosis not present

## 2019-07-22 DIAGNOSIS — I493 Ventricular premature depolarization: Secondary | ICD-10-CM | POA: Diagnosis not present

## 2019-07-22 DIAGNOSIS — J90 Pleural effusion, not elsewhere classified: Secondary | ICD-10-CM | POA: Diagnosis not present

## 2019-07-22 DIAGNOSIS — J984 Other disorders of lung: Secondary | ICD-10-CM | POA: Diagnosis not present

## 2019-07-22 DIAGNOSIS — Z87891 Personal history of nicotine dependence: Secondary | ICD-10-CM | POA: Diagnosis not present

## 2019-07-22 DIAGNOSIS — R1013 Epigastric pain: Secondary | ICD-10-CM | POA: Diagnosis not present

## 2019-07-22 DIAGNOSIS — J9611 Chronic respiratory failure with hypoxia: Secondary | ICD-10-CM | POA: Diagnosis not present

## 2019-07-22 DIAGNOSIS — I959 Hypotension, unspecified: Secondary | ICD-10-CM | POA: Diagnosis not present

## 2019-07-22 DIAGNOSIS — R8290 Unspecified abnormal findings in urine: Secondary | ICD-10-CM | POA: Diagnosis not present

## 2019-07-22 DIAGNOSIS — C641 Malignant neoplasm of right kidney, except renal pelvis: Secondary | ICD-10-CM | POA: Diagnosis not present

## 2019-07-22 DIAGNOSIS — Z743 Need for continuous supervision: Secondary | ICD-10-CM | POA: Diagnosis not present

## 2019-07-22 DIAGNOSIS — I517 Cardiomegaly: Secondary | ICD-10-CM | POA: Diagnosis not present

## 2019-07-22 DIAGNOSIS — R0902 Hypoxemia: Secondary | ICD-10-CM | POA: Diagnosis not present

## 2019-07-22 DIAGNOSIS — R300 Dysuria: Secondary | ICD-10-CM | POA: Diagnosis not present

## 2019-07-22 DIAGNOSIS — I472 Ventricular tachycardia: Secondary | ICD-10-CM | POA: Diagnosis not present

## 2019-07-22 DIAGNOSIS — Z7401 Bed confinement status: Secondary | ICD-10-CM | POA: Diagnosis not present

## 2019-07-22 DIAGNOSIS — N3090 Cystitis, unspecified without hematuria: Secondary | ICD-10-CM | POA: Diagnosis not present

## 2019-07-22 DIAGNOSIS — R197 Diarrhea, unspecified: Secondary | ICD-10-CM | POA: Diagnosis not present

## 2019-07-22 DIAGNOSIS — R609 Edema, unspecified: Secondary | ICD-10-CM | POA: Diagnosis not present

## 2019-07-22 DIAGNOSIS — M2550 Pain in unspecified joint: Secondary | ICD-10-CM | POA: Diagnosis not present

## 2019-07-22 DIAGNOSIS — R918 Other nonspecific abnormal finding of lung field: Secondary | ICD-10-CM | POA: Diagnosis not present

## 2019-07-23 DIAGNOSIS — M1990 Unspecified osteoarthritis, unspecified site: Secondary | ICD-10-CM | POA: Diagnosis not present

## 2019-07-23 DIAGNOSIS — G4733 Obstructive sleep apnea (adult) (pediatric): Secondary | ICD-10-CM | POA: Diagnosis not present

## 2019-07-23 DIAGNOSIS — Z905 Acquired absence of kidney: Secondary | ICD-10-CM | POA: Diagnosis not present

## 2019-07-23 DIAGNOSIS — N183 Chronic kidney disease, stage 3 unspecified: Secondary | ICD-10-CM | POA: Diagnosis not present

## 2019-07-23 DIAGNOSIS — J9621 Acute and chronic respiratory failure with hypoxia: Secondary | ICD-10-CM | POA: Diagnosis not present

## 2019-07-23 DIAGNOSIS — M109 Gout, unspecified: Secondary | ICD-10-CM | POA: Diagnosis not present

## 2019-07-23 DIAGNOSIS — J9622 Acute and chronic respiratory failure with hypercapnia: Secondary | ICD-10-CM | POA: Diagnosis not present

## 2019-07-23 DIAGNOSIS — K529 Noninfective gastroenteritis and colitis, unspecified: Secondary | ICD-10-CM | POA: Diagnosis not present

## 2019-07-23 DIAGNOSIS — R300 Dysuria: Secondary | ICD-10-CM | POA: Diagnosis not present

## 2019-07-23 DIAGNOSIS — Z9981 Dependence on supplemental oxygen: Secondary | ICD-10-CM | POA: Diagnosis not present

## 2019-07-23 DIAGNOSIS — E538 Deficiency of other specified B group vitamins: Secondary | ICD-10-CM | POA: Diagnosis not present

## 2019-07-23 DIAGNOSIS — E785 Hyperlipidemia, unspecified: Secondary | ICD-10-CM | POA: Diagnosis not present

## 2019-07-23 DIAGNOSIS — I130 Hypertensive heart and chronic kidney disease with heart failure and stage 1 through stage 4 chronic kidney disease, or unspecified chronic kidney disease: Secondary | ICD-10-CM | POA: Diagnosis not present

## 2019-07-23 DIAGNOSIS — Z7401 Bed confinement status: Secondary | ICD-10-CM | POA: Diagnosis not present

## 2019-07-23 DIAGNOSIS — I509 Heart failure, unspecified: Secondary | ICD-10-CM | POA: Diagnosis not present

## 2019-07-26 DIAGNOSIS — N183 Chronic kidney disease, stage 3 unspecified: Secondary | ICD-10-CM | POA: Diagnosis not present

## 2019-07-26 DIAGNOSIS — J9621 Acute and chronic respiratory failure with hypoxia: Secondary | ICD-10-CM | POA: Diagnosis not present

## 2019-07-26 DIAGNOSIS — M1990 Unspecified osteoarthritis, unspecified site: Secondary | ICD-10-CM | POA: Diagnosis not present

## 2019-07-26 DIAGNOSIS — M109 Gout, unspecified: Secondary | ICD-10-CM | POA: Diagnosis not present

## 2019-07-26 DIAGNOSIS — Z905 Acquired absence of kidney: Secondary | ICD-10-CM | POA: Diagnosis not present

## 2019-07-26 DIAGNOSIS — G4733 Obstructive sleep apnea (adult) (pediatric): Secondary | ICD-10-CM | POA: Diagnosis not present

## 2019-07-26 DIAGNOSIS — I130 Hypertensive heart and chronic kidney disease with heart failure and stage 1 through stage 4 chronic kidney disease, or unspecified chronic kidney disease: Secondary | ICD-10-CM | POA: Diagnosis not present

## 2019-07-26 DIAGNOSIS — K529 Noninfective gastroenteritis and colitis, unspecified: Secondary | ICD-10-CM | POA: Diagnosis not present

## 2019-07-26 DIAGNOSIS — Z7401 Bed confinement status: Secondary | ICD-10-CM | POA: Diagnosis not present

## 2019-07-26 DIAGNOSIS — R300 Dysuria: Secondary | ICD-10-CM | POA: Diagnosis not present

## 2019-07-26 DIAGNOSIS — Z9981 Dependence on supplemental oxygen: Secondary | ICD-10-CM | POA: Diagnosis not present

## 2019-07-26 DIAGNOSIS — I509 Heart failure, unspecified: Secondary | ICD-10-CM | POA: Diagnosis not present

## 2019-07-26 DIAGNOSIS — E785 Hyperlipidemia, unspecified: Secondary | ICD-10-CM | POA: Diagnosis not present

## 2019-07-26 DIAGNOSIS — J9622 Acute and chronic respiratory failure with hypercapnia: Secondary | ICD-10-CM | POA: Diagnosis not present

## 2019-07-26 DIAGNOSIS — E538 Deficiency of other specified B group vitamins: Secondary | ICD-10-CM | POA: Diagnosis not present

## 2019-07-29 DIAGNOSIS — E538 Deficiency of other specified B group vitamins: Secondary | ICD-10-CM | POA: Diagnosis not present

## 2019-07-29 DIAGNOSIS — N183 Chronic kidney disease, stage 3 unspecified: Secondary | ICD-10-CM | POA: Diagnosis not present

## 2019-07-29 DIAGNOSIS — J9621 Acute and chronic respiratory failure with hypoxia: Secondary | ICD-10-CM | POA: Diagnosis not present

## 2019-07-29 DIAGNOSIS — K529 Noninfective gastroenteritis and colitis, unspecified: Secondary | ICD-10-CM | POA: Diagnosis not present

## 2019-07-29 DIAGNOSIS — M109 Gout, unspecified: Secondary | ICD-10-CM | POA: Diagnosis not present

## 2019-07-29 DIAGNOSIS — J9622 Acute and chronic respiratory failure with hypercapnia: Secondary | ICD-10-CM | POA: Diagnosis not present

## 2019-07-29 DIAGNOSIS — I509 Heart failure, unspecified: Secondary | ICD-10-CM | POA: Diagnosis not present

## 2019-07-29 DIAGNOSIS — R300 Dysuria: Secondary | ICD-10-CM | POA: Diagnosis not present

## 2019-07-29 DIAGNOSIS — Z7401 Bed confinement status: Secondary | ICD-10-CM | POA: Diagnosis not present

## 2019-07-29 DIAGNOSIS — G4733 Obstructive sleep apnea (adult) (pediatric): Secondary | ICD-10-CM | POA: Diagnosis not present

## 2019-07-29 DIAGNOSIS — Z905 Acquired absence of kidney: Secondary | ICD-10-CM | POA: Diagnosis not present

## 2019-07-29 DIAGNOSIS — I130 Hypertensive heart and chronic kidney disease with heart failure and stage 1 through stage 4 chronic kidney disease, or unspecified chronic kidney disease: Secondary | ICD-10-CM | POA: Diagnosis not present

## 2019-07-29 DIAGNOSIS — Z9981 Dependence on supplemental oxygen: Secondary | ICD-10-CM | POA: Diagnosis not present

## 2019-07-29 DIAGNOSIS — E785 Hyperlipidemia, unspecified: Secondary | ICD-10-CM | POA: Diagnosis not present

## 2019-07-29 DIAGNOSIS — M1990 Unspecified osteoarthritis, unspecified site: Secondary | ICD-10-CM | POA: Diagnosis not present

## 2019-08-01 DIAGNOSIS — I509 Heart failure, unspecified: Secondary | ICD-10-CM | POA: Diagnosis not present

## 2019-08-01 DIAGNOSIS — I130 Hypertensive heart and chronic kidney disease with heart failure and stage 1 through stage 4 chronic kidney disease, or unspecified chronic kidney disease: Secondary | ICD-10-CM | POA: Diagnosis not present

## 2019-08-01 DIAGNOSIS — G4733 Obstructive sleep apnea (adult) (pediatric): Secondary | ICD-10-CM | POA: Diagnosis not present

## 2019-08-01 DIAGNOSIS — E785 Hyperlipidemia, unspecified: Secondary | ICD-10-CM | POA: Diagnosis not present

## 2019-08-01 DIAGNOSIS — R300 Dysuria: Secondary | ICD-10-CM | POA: Diagnosis not present

## 2019-08-01 DIAGNOSIS — E538 Deficiency of other specified B group vitamins: Secondary | ICD-10-CM | POA: Diagnosis not present

## 2019-08-01 DIAGNOSIS — M109 Gout, unspecified: Secondary | ICD-10-CM | POA: Diagnosis not present

## 2019-08-01 DIAGNOSIS — N183 Chronic kidney disease, stage 3 unspecified: Secondary | ICD-10-CM | POA: Diagnosis not present

## 2019-08-01 DIAGNOSIS — K529 Noninfective gastroenteritis and colitis, unspecified: Secondary | ICD-10-CM | POA: Diagnosis not present

## 2019-08-01 DIAGNOSIS — M1990 Unspecified osteoarthritis, unspecified site: Secondary | ICD-10-CM | POA: Diagnosis not present

## 2019-08-01 DIAGNOSIS — J9621 Acute and chronic respiratory failure with hypoxia: Secondary | ICD-10-CM | POA: Diagnosis not present

## 2019-08-01 DIAGNOSIS — Z9981 Dependence on supplemental oxygen: Secondary | ICD-10-CM | POA: Diagnosis not present

## 2019-08-01 DIAGNOSIS — J9622 Acute and chronic respiratory failure with hypercapnia: Secondary | ICD-10-CM | POA: Diagnosis not present

## 2019-08-01 DIAGNOSIS — Z7401 Bed confinement status: Secondary | ICD-10-CM | POA: Diagnosis not present

## 2019-08-01 DIAGNOSIS — Z905 Acquired absence of kidney: Secondary | ICD-10-CM | POA: Diagnosis not present

## 2019-08-04 DIAGNOSIS — I130 Hypertensive heart and chronic kidney disease with heart failure and stage 1 through stage 4 chronic kidney disease, or unspecified chronic kidney disease: Secondary | ICD-10-CM | POA: Diagnosis not present

## 2019-08-04 DIAGNOSIS — N183 Chronic kidney disease, stage 3 unspecified: Secondary | ICD-10-CM | POA: Diagnosis not present

## 2019-08-04 DIAGNOSIS — J9621 Acute and chronic respiratory failure with hypoxia: Secondary | ICD-10-CM | POA: Diagnosis not present

## 2019-08-04 DIAGNOSIS — I509 Heart failure, unspecified: Secondary | ICD-10-CM | POA: Diagnosis not present

## 2019-08-05 DIAGNOSIS — I509 Heart failure, unspecified: Secondary | ICD-10-CM | POA: Diagnosis not present

## 2019-08-05 DIAGNOSIS — Z9981 Dependence on supplemental oxygen: Secondary | ICD-10-CM | POA: Diagnosis not present

## 2019-08-05 DIAGNOSIS — G4733 Obstructive sleep apnea (adult) (pediatric): Secondary | ICD-10-CM | POA: Diagnosis not present

## 2019-08-05 DIAGNOSIS — R300 Dysuria: Secondary | ICD-10-CM | POA: Diagnosis not present

## 2019-08-05 DIAGNOSIS — M109 Gout, unspecified: Secondary | ICD-10-CM | POA: Diagnosis not present

## 2019-08-05 DIAGNOSIS — N183 Chronic kidney disease, stage 3 unspecified: Secondary | ICD-10-CM | POA: Diagnosis not present

## 2019-08-05 DIAGNOSIS — M1990 Unspecified osteoarthritis, unspecified site: Secondary | ICD-10-CM | POA: Diagnosis not present

## 2019-08-05 DIAGNOSIS — I130 Hypertensive heart and chronic kidney disease with heart failure and stage 1 through stage 4 chronic kidney disease, or unspecified chronic kidney disease: Secondary | ICD-10-CM | POA: Diagnosis not present

## 2019-08-05 DIAGNOSIS — K529 Noninfective gastroenteritis and colitis, unspecified: Secondary | ICD-10-CM | POA: Diagnosis not present

## 2019-08-05 DIAGNOSIS — J9621 Acute and chronic respiratory failure with hypoxia: Secondary | ICD-10-CM | POA: Diagnosis not present

## 2019-08-05 DIAGNOSIS — E785 Hyperlipidemia, unspecified: Secondary | ICD-10-CM | POA: Diagnosis not present

## 2019-08-05 DIAGNOSIS — E538 Deficiency of other specified B group vitamins: Secondary | ICD-10-CM | POA: Diagnosis not present

## 2019-08-05 DIAGNOSIS — Z7401 Bed confinement status: Secondary | ICD-10-CM | POA: Diagnosis not present

## 2019-08-05 DIAGNOSIS — J9622 Acute and chronic respiratory failure with hypercapnia: Secondary | ICD-10-CM | POA: Diagnosis not present

## 2019-08-05 DIAGNOSIS — Z905 Acquired absence of kidney: Secondary | ICD-10-CM | POA: Diagnosis not present

## 2019-08-08 DIAGNOSIS — G4733 Obstructive sleep apnea (adult) (pediatric): Secondary | ICD-10-CM | POA: Diagnosis not present

## 2019-08-08 DIAGNOSIS — M1990 Unspecified osteoarthritis, unspecified site: Secondary | ICD-10-CM | POA: Diagnosis not present

## 2019-08-08 DIAGNOSIS — Z7401 Bed confinement status: Secondary | ICD-10-CM | POA: Diagnosis not present

## 2019-08-08 DIAGNOSIS — E538 Deficiency of other specified B group vitamins: Secondary | ICD-10-CM | POA: Diagnosis not present

## 2019-08-08 DIAGNOSIS — J9622 Acute and chronic respiratory failure with hypercapnia: Secondary | ICD-10-CM | POA: Diagnosis not present

## 2019-08-08 DIAGNOSIS — E785 Hyperlipidemia, unspecified: Secondary | ICD-10-CM | POA: Diagnosis not present

## 2019-08-08 DIAGNOSIS — Z9981 Dependence on supplemental oxygen: Secondary | ICD-10-CM | POA: Diagnosis not present

## 2019-08-08 DIAGNOSIS — Z905 Acquired absence of kidney: Secondary | ICD-10-CM | POA: Diagnosis not present

## 2019-08-08 DIAGNOSIS — I509 Heart failure, unspecified: Secondary | ICD-10-CM | POA: Diagnosis not present

## 2019-08-08 DIAGNOSIS — N183 Chronic kidney disease, stage 3 unspecified: Secondary | ICD-10-CM | POA: Diagnosis not present

## 2019-08-08 DIAGNOSIS — I130 Hypertensive heart and chronic kidney disease with heart failure and stage 1 through stage 4 chronic kidney disease, or unspecified chronic kidney disease: Secondary | ICD-10-CM | POA: Diagnosis not present

## 2019-08-08 DIAGNOSIS — R300 Dysuria: Secondary | ICD-10-CM | POA: Diagnosis not present

## 2019-08-08 DIAGNOSIS — M109 Gout, unspecified: Secondary | ICD-10-CM | POA: Diagnosis not present

## 2019-08-08 DIAGNOSIS — J9621 Acute and chronic respiratory failure with hypoxia: Secondary | ICD-10-CM | POA: Diagnosis not present

## 2019-08-08 DIAGNOSIS — K529 Noninfective gastroenteritis and colitis, unspecified: Secondary | ICD-10-CM | POA: Diagnosis not present

## 2019-08-13 DIAGNOSIS — C649 Malignant neoplasm of unspecified kidney, except renal pelvis: Secondary | ICD-10-CM | POA: Diagnosis not present

## 2019-08-13 DIAGNOSIS — J9610 Chronic respiratory failure, unspecified whether with hypoxia or hypercapnia: Secondary | ICD-10-CM | POA: Diagnosis not present

## 2019-08-19 DIAGNOSIS — J9621 Acute and chronic respiratory failure with hypoxia: Secondary | ICD-10-CM | POA: Diagnosis not present

## 2019-08-19 DIAGNOSIS — Z9981 Dependence on supplemental oxygen: Secondary | ICD-10-CM | POA: Diagnosis not present

## 2019-08-19 DIAGNOSIS — N183 Chronic kidney disease, stage 3 unspecified: Secondary | ICD-10-CM | POA: Diagnosis not present

## 2019-08-19 DIAGNOSIS — M1990 Unspecified osteoarthritis, unspecified site: Secondary | ICD-10-CM | POA: Diagnosis not present

## 2019-08-19 DIAGNOSIS — Z905 Acquired absence of kidney: Secondary | ICD-10-CM | POA: Diagnosis not present

## 2019-08-19 DIAGNOSIS — M109 Gout, unspecified: Secondary | ICD-10-CM | POA: Diagnosis not present

## 2019-08-19 DIAGNOSIS — K529 Noninfective gastroenteritis and colitis, unspecified: Secondary | ICD-10-CM | POA: Diagnosis not present

## 2019-08-19 DIAGNOSIS — E785 Hyperlipidemia, unspecified: Secondary | ICD-10-CM | POA: Diagnosis not present

## 2019-08-19 DIAGNOSIS — R300 Dysuria: Secondary | ICD-10-CM | POA: Diagnosis not present

## 2019-08-19 DIAGNOSIS — G4733 Obstructive sleep apnea (adult) (pediatric): Secondary | ICD-10-CM | POA: Diagnosis not present

## 2019-08-19 DIAGNOSIS — J9622 Acute and chronic respiratory failure with hypercapnia: Secondary | ICD-10-CM | POA: Diagnosis not present

## 2019-08-19 DIAGNOSIS — Z7401 Bed confinement status: Secondary | ICD-10-CM | POA: Diagnosis not present

## 2019-08-19 DIAGNOSIS — I509 Heart failure, unspecified: Secondary | ICD-10-CM | POA: Diagnosis not present

## 2019-08-19 DIAGNOSIS — I130 Hypertensive heart and chronic kidney disease with heart failure and stage 1 through stage 4 chronic kidney disease, or unspecified chronic kidney disease: Secondary | ICD-10-CM | POA: Diagnosis not present

## 2019-08-19 DIAGNOSIS — E538 Deficiency of other specified B group vitamins: Secondary | ICD-10-CM | POA: Diagnosis not present

## 2019-09-10 DIAGNOSIS — R300 Dysuria: Secondary | ICD-10-CM | POA: Diagnosis not present

## 2019-09-10 DIAGNOSIS — N183 Chronic kidney disease, stage 3 unspecified: Secondary | ICD-10-CM | POA: Diagnosis not present

## 2019-09-10 DIAGNOSIS — Z905 Acquired absence of kidney: Secondary | ICD-10-CM | POA: Diagnosis not present

## 2019-09-10 DIAGNOSIS — I130 Hypertensive heart and chronic kidney disease with heart failure and stage 1 through stage 4 chronic kidney disease, or unspecified chronic kidney disease: Secondary | ICD-10-CM | POA: Diagnosis not present

## 2019-09-10 DIAGNOSIS — E785 Hyperlipidemia, unspecified: Secondary | ICD-10-CM | POA: Diagnosis not present

## 2019-09-10 DIAGNOSIS — Z9981 Dependence on supplemental oxygen: Secondary | ICD-10-CM | POA: Diagnosis not present

## 2019-09-10 DIAGNOSIS — M1990 Unspecified osteoarthritis, unspecified site: Secondary | ICD-10-CM | POA: Diagnosis not present

## 2019-09-10 DIAGNOSIS — Z7401 Bed confinement status: Secondary | ICD-10-CM | POA: Diagnosis not present

## 2019-09-10 DIAGNOSIS — G4733 Obstructive sleep apnea (adult) (pediatric): Secondary | ICD-10-CM | POA: Diagnosis not present

## 2019-09-10 DIAGNOSIS — M109 Gout, unspecified: Secondary | ICD-10-CM | POA: Diagnosis not present

## 2019-09-10 DIAGNOSIS — J9622 Acute and chronic respiratory failure with hypercapnia: Secondary | ICD-10-CM | POA: Diagnosis not present

## 2019-09-10 DIAGNOSIS — E538 Deficiency of other specified B group vitamins: Secondary | ICD-10-CM | POA: Diagnosis not present

## 2019-09-10 DIAGNOSIS — J9621 Acute and chronic respiratory failure with hypoxia: Secondary | ICD-10-CM | POA: Diagnosis not present

## 2019-09-10 DIAGNOSIS — I509 Heart failure, unspecified: Secondary | ICD-10-CM | POA: Diagnosis not present

## 2019-09-10 DIAGNOSIS — K529 Noninfective gastroenteritis and colitis, unspecified: Secondary | ICD-10-CM | POA: Diagnosis not present

## 2019-09-12 DIAGNOSIS — C649 Malignant neoplasm of unspecified kidney, except renal pelvis: Secondary | ICD-10-CM | POA: Diagnosis not present

## 2019-09-12 DIAGNOSIS — J9610 Chronic respiratory failure, unspecified whether with hypoxia or hypercapnia: Secondary | ICD-10-CM | POA: Diagnosis not present

## 2019-10-10 DIAGNOSIS — I4892 Unspecified atrial flutter: Secondary | ICD-10-CM | POA: Diagnosis not present

## 2019-10-10 DIAGNOSIS — Z4509 Encounter for adjustment and management of other cardiac device: Secondary | ICD-10-CM | POA: Diagnosis not present

## 2019-10-13 DIAGNOSIS — J9610 Chronic respiratory failure, unspecified whether with hypoxia or hypercapnia: Secondary | ICD-10-CM | POA: Diagnosis not present

## 2019-10-13 DIAGNOSIS — C649 Malignant neoplasm of unspecified kidney, except renal pelvis: Secondary | ICD-10-CM | POA: Diagnosis not present

## 2019-11-10 DIAGNOSIS — I480 Paroxysmal atrial fibrillation: Secondary | ICD-10-CM | POA: Diagnosis not present

## 2019-11-10 DIAGNOSIS — Z4509 Encounter for adjustment and management of other cardiac device: Secondary | ICD-10-CM | POA: Diagnosis not present

## 2019-11-13 DIAGNOSIS — C649 Malignant neoplasm of unspecified kidney, except renal pelvis: Secondary | ICD-10-CM | POA: Diagnosis not present

## 2019-11-13 DIAGNOSIS — J9610 Chronic respiratory failure, unspecified whether with hypoxia or hypercapnia: Secondary | ICD-10-CM | POA: Diagnosis not present

## 2019-11-27 DIAGNOSIS — E7849 Other hyperlipidemia: Secondary | ICD-10-CM | POA: Diagnosis not present

## 2019-11-27 DIAGNOSIS — I509 Heart failure, unspecified: Secondary | ICD-10-CM | POA: Diagnosis not present

## 2019-11-27 DIAGNOSIS — N184 Chronic kidney disease, stage 4 (severe): Secondary | ICD-10-CM | POA: Diagnosis not present

## 2019-11-27 DIAGNOSIS — I130 Hypertensive heart and chronic kidney disease with heart failure and stage 1 through stage 4 chronic kidney disease, or unspecified chronic kidney disease: Secondary | ICD-10-CM | POA: Diagnosis not present

## 2019-12-11 DIAGNOSIS — Z95818 Presence of other cardiac implants and grafts: Secondary | ICD-10-CM | POA: Diagnosis not present

## 2019-12-11 DIAGNOSIS — Z4509 Encounter for adjustment and management of other cardiac device: Secondary | ICD-10-CM | POA: Diagnosis not present

## 2019-12-11 DIAGNOSIS — I4892 Unspecified atrial flutter: Secondary | ICD-10-CM | POA: Diagnosis not present

## 2019-12-11 DIAGNOSIS — I480 Paroxysmal atrial fibrillation: Secondary | ICD-10-CM | POA: Diagnosis not present

## 2019-12-13 DIAGNOSIS — J9610 Chronic respiratory failure, unspecified whether with hypoxia or hypercapnia: Secondary | ICD-10-CM | POA: Diagnosis not present

## 2019-12-13 DIAGNOSIS — C649 Malignant neoplasm of unspecified kidney, except renal pelvis: Secondary | ICD-10-CM | POA: Diagnosis not present

## 2019-12-27 DIAGNOSIS — E7849 Other hyperlipidemia: Secondary | ICD-10-CM | POA: Diagnosis not present

## 2019-12-27 DIAGNOSIS — N184 Chronic kidney disease, stage 4 (severe): Secondary | ICD-10-CM | POA: Diagnosis not present

## 2019-12-27 DIAGNOSIS — I130 Hypertensive heart and chronic kidney disease with heart failure and stage 1 through stage 4 chronic kidney disease, or unspecified chronic kidney disease: Secondary | ICD-10-CM | POA: Diagnosis not present

## 2019-12-27 DIAGNOSIS — I509 Heart failure, unspecified: Secondary | ICD-10-CM | POA: Diagnosis not present

## 2020-01-12 DIAGNOSIS — I4892 Unspecified atrial flutter: Secondary | ICD-10-CM | POA: Diagnosis not present

## 2020-01-12 DIAGNOSIS — Z95818 Presence of other cardiac implants and grafts: Secondary | ICD-10-CM | POA: Diagnosis not present

## 2020-01-12 DIAGNOSIS — I480 Paroxysmal atrial fibrillation: Secondary | ICD-10-CM | POA: Diagnosis not present

## 2020-01-12 DIAGNOSIS — Z4509 Encounter for adjustment and management of other cardiac device: Secondary | ICD-10-CM | POA: Diagnosis not present

## 2020-01-13 DIAGNOSIS — J9610 Chronic respiratory failure, unspecified whether with hypoxia or hypercapnia: Secondary | ICD-10-CM | POA: Diagnosis not present

## 2020-01-13 DIAGNOSIS — C649 Malignant neoplasm of unspecified kidney, except renal pelvis: Secondary | ICD-10-CM | POA: Diagnosis not present

## 2020-01-27 DIAGNOSIS — I130 Hypertensive heart and chronic kidney disease with heart failure and stage 1 through stage 4 chronic kidney disease, or unspecified chronic kidney disease: Secondary | ICD-10-CM | POA: Diagnosis not present

## 2020-01-27 DIAGNOSIS — N184 Chronic kidney disease, stage 4 (severe): Secondary | ICD-10-CM | POA: Diagnosis not present

## 2020-01-27 DIAGNOSIS — I509 Heart failure, unspecified: Secondary | ICD-10-CM | POA: Diagnosis not present

## 2020-01-27 DIAGNOSIS — E7849 Other hyperlipidemia: Secondary | ICD-10-CM | POA: Diagnosis not present

## 2020-02-12 DIAGNOSIS — I480 Paroxysmal atrial fibrillation: Secondary | ICD-10-CM | POA: Diagnosis not present

## 2020-02-12 DIAGNOSIS — Z4509 Encounter for adjustment and management of other cardiac device: Secondary | ICD-10-CM | POA: Diagnosis not present

## 2020-02-12 DIAGNOSIS — J9610 Chronic respiratory failure, unspecified whether with hypoxia or hypercapnia: Secondary | ICD-10-CM | POA: Diagnosis not present

## 2020-02-12 DIAGNOSIS — C649 Malignant neoplasm of unspecified kidney, except renal pelvis: Secondary | ICD-10-CM | POA: Diagnosis not present

## 2020-02-12 DIAGNOSIS — Z95818 Presence of other cardiac implants and grafts: Secondary | ICD-10-CM | POA: Diagnosis not present

## 2020-02-12 DIAGNOSIS — I4892 Unspecified atrial flutter: Secondary | ICD-10-CM | POA: Diagnosis not present

## 2020-02-27 DIAGNOSIS — I509 Heart failure, unspecified: Secondary | ICD-10-CM | POA: Diagnosis not present

## 2020-02-27 DIAGNOSIS — E7849 Other hyperlipidemia: Secondary | ICD-10-CM | POA: Diagnosis not present

## 2020-02-27 DIAGNOSIS — I130 Hypertensive heart and chronic kidney disease with heart failure and stage 1 through stage 4 chronic kidney disease, or unspecified chronic kidney disease: Secondary | ICD-10-CM | POA: Diagnosis not present

## 2020-02-27 DIAGNOSIS — N184 Chronic kidney disease, stage 4 (severe): Secondary | ICD-10-CM | POA: Diagnosis not present

## 2020-03-14 DIAGNOSIS — J9610 Chronic respiratory failure, unspecified whether with hypoxia or hypercapnia: Secondary | ICD-10-CM | POA: Diagnosis not present

## 2020-03-14 DIAGNOSIS — C649 Malignant neoplasm of unspecified kidney, except renal pelvis: Secondary | ICD-10-CM | POA: Diagnosis not present

## 2020-03-16 DIAGNOSIS — Z95818 Presence of other cardiac implants and grafts: Secondary | ICD-10-CM | POA: Diagnosis not present

## 2020-03-16 DIAGNOSIS — Z4509 Encounter for adjustment and management of other cardiac device: Secondary | ICD-10-CM | POA: Diagnosis not present

## 2020-03-16 DIAGNOSIS — I480 Paroxysmal atrial fibrillation: Secondary | ICD-10-CM | POA: Diagnosis not present

## 2020-03-16 DIAGNOSIS — I4892 Unspecified atrial flutter: Secondary | ICD-10-CM | POA: Diagnosis not present

## 2020-03-27 DIAGNOSIS — E7849 Other hyperlipidemia: Secondary | ICD-10-CM | POA: Diagnosis not present

## 2020-03-27 DIAGNOSIS — I509 Heart failure, unspecified: Secondary | ICD-10-CM | POA: Diagnosis not present

## 2020-03-27 DIAGNOSIS — I130 Hypertensive heart and chronic kidney disease with heart failure and stage 1 through stage 4 chronic kidney disease, or unspecified chronic kidney disease: Secondary | ICD-10-CM | POA: Diagnosis not present

## 2020-03-27 DIAGNOSIS — N184 Chronic kidney disease, stage 4 (severe): Secondary | ICD-10-CM | POA: Diagnosis not present

## 2020-04-13 DIAGNOSIS — I4892 Unspecified atrial flutter: Secondary | ICD-10-CM | POA: Diagnosis not present

## 2020-04-13 DIAGNOSIS — Z95818 Presence of other cardiac implants and grafts: Secondary | ICD-10-CM | POA: Diagnosis not present

## 2020-04-14 DIAGNOSIS — C649 Malignant neoplasm of unspecified kidney, except renal pelvis: Secondary | ICD-10-CM | POA: Diagnosis not present

## 2020-04-14 DIAGNOSIS — J9610 Chronic respiratory failure, unspecified whether with hypoxia or hypercapnia: Secondary | ICD-10-CM | POA: Diagnosis not present

## 2020-04-15 DIAGNOSIS — I4892 Unspecified atrial flutter: Secondary | ICD-10-CM | POA: Diagnosis not present

## 2020-04-15 DIAGNOSIS — Z4509 Encounter for adjustment and management of other cardiac device: Secondary | ICD-10-CM | POA: Diagnosis not present

## 2020-05-12 DIAGNOSIS — J9610 Chronic respiratory failure, unspecified whether with hypoxia or hypercapnia: Secondary | ICD-10-CM | POA: Diagnosis not present

## 2020-05-12 DIAGNOSIS — C649 Malignant neoplasm of unspecified kidney, except renal pelvis: Secondary | ICD-10-CM | POA: Diagnosis not present

## 2020-05-17 DIAGNOSIS — Z4509 Encounter for adjustment and management of other cardiac device: Secondary | ICD-10-CM | POA: Diagnosis not present

## 2020-05-17 DIAGNOSIS — I480 Paroxysmal atrial fibrillation: Secondary | ICD-10-CM | POA: Diagnosis not present

## 2020-05-19 DIAGNOSIS — R918 Other nonspecific abnormal finding of lung field: Secondary | ICD-10-CM | POA: Diagnosis not present

## 2020-05-19 DIAGNOSIS — J90 Pleural effusion, not elsewhere classified: Secondary | ICD-10-CM | POA: Diagnosis not present

## 2020-05-19 DIAGNOSIS — N184 Chronic kidney disease, stage 4 (severe): Secondary | ICD-10-CM | POA: Diagnosis not present

## 2020-05-19 DIAGNOSIS — I2510 Atherosclerotic heart disease of native coronary artery without angina pectoris: Secondary | ICD-10-CM | POA: Diagnosis not present

## 2020-05-19 DIAGNOSIS — M7989 Other specified soft tissue disorders: Secondary | ICD-10-CM | POA: Diagnosis not present

## 2020-05-19 DIAGNOSIS — R600 Localized edema: Secondary | ICD-10-CM | POA: Diagnosis not present

## 2020-05-19 DIAGNOSIS — Z85528 Personal history of other malignant neoplasm of kidney: Secondary | ICD-10-CM | POA: Diagnosis not present

## 2020-05-19 DIAGNOSIS — J398 Other specified diseases of upper respiratory tract: Secondary | ICD-10-CM | POA: Diagnosis not present

## 2020-05-19 DIAGNOSIS — G4733 Obstructive sleep apnea (adult) (pediatric): Secondary | ICD-10-CM | POA: Diagnosis not present

## 2020-05-19 DIAGNOSIS — J9811 Atelectasis: Secondary | ICD-10-CM | POA: Diagnosis not present

## 2020-05-19 DIAGNOSIS — R21 Rash and other nonspecific skin eruption: Secondary | ICD-10-CM | POA: Diagnosis not present

## 2020-05-19 DIAGNOSIS — J9622 Acute and chronic respiratory failure with hypercapnia: Secondary | ICD-10-CM | POA: Diagnosis not present

## 2020-05-19 DIAGNOSIS — J9621 Acute and chronic respiratory failure with hypoxia: Secondary | ICD-10-CM | POA: Diagnosis not present

## 2020-05-19 DIAGNOSIS — N179 Acute kidney failure, unspecified: Secondary | ICD-10-CM | POA: Diagnosis not present

## 2020-05-19 DIAGNOSIS — Z4659 Encounter for fitting and adjustment of other gastrointestinal appliance and device: Secondary | ICD-10-CM | POA: Diagnosis not present

## 2020-05-19 DIAGNOSIS — J9602 Acute respiratory failure with hypercapnia: Secondary | ICD-10-CM | POA: Diagnosis not present

## 2020-05-19 DIAGNOSIS — Z20822 Contact with and (suspected) exposure to covid-19: Secondary | ICD-10-CM | POA: Diagnosis not present

## 2020-05-19 DIAGNOSIS — J9601 Acute respiratory failure with hypoxia: Secondary | ICD-10-CM | POA: Diagnosis not present

## 2020-05-19 DIAGNOSIS — E7849 Other hyperlipidemia: Secondary | ICD-10-CM | POA: Diagnosis not present

## 2020-05-19 DIAGNOSIS — Z9119 Patient's noncompliance with other medical treatment and regimen: Secondary | ICD-10-CM | POA: Diagnosis not present

## 2020-05-19 DIAGNOSIS — I5031 Acute diastolic (congestive) heart failure: Secondary | ICD-10-CM | POA: Diagnosis not present

## 2020-05-19 DIAGNOSIS — Z7401 Bed confinement status: Secondary | ICD-10-CM | POA: Diagnosis not present

## 2020-05-19 DIAGNOSIS — Z79899 Other long term (current) drug therapy: Secondary | ICD-10-CM | POA: Diagnosis not present

## 2020-05-19 DIAGNOSIS — N189 Chronic kidney disease, unspecified: Secondary | ICD-10-CM | POA: Diagnosis not present

## 2020-05-19 DIAGNOSIS — E785 Hyperlipidemia, unspecified: Secondary | ICD-10-CM | POA: Diagnosis not present

## 2020-05-19 DIAGNOSIS — Z87891 Personal history of nicotine dependence: Secondary | ICD-10-CM | POA: Diagnosis not present

## 2020-05-19 DIAGNOSIS — N183 Chronic kidney disease, stage 3 unspecified: Secondary | ICD-10-CM | POA: Diagnosis not present

## 2020-05-19 DIAGNOSIS — Z905 Acquired absence of kidney: Secondary | ICD-10-CM | POA: Diagnosis not present

## 2020-05-19 DIAGNOSIS — R262 Difficulty in walking, not elsewhere classified: Secondary | ICD-10-CM | POA: Diagnosis not present

## 2020-05-19 DIAGNOSIS — M62838 Other muscle spasm: Secondary | ICD-10-CM | POA: Diagnosis not present

## 2020-05-19 DIAGNOSIS — G549 Nerve root and plexus disorder, unspecified: Secondary | ICD-10-CM | POA: Diagnosis not present

## 2020-05-19 DIAGNOSIS — I509 Heart failure, unspecified: Secondary | ICD-10-CM | POA: Diagnosis not present

## 2020-05-19 DIAGNOSIS — R069 Unspecified abnormalities of breathing: Secondary | ICD-10-CM | POA: Diagnosis not present

## 2020-05-19 DIAGNOSIS — T502X5A Adverse effect of carbonic-anhydrase inhibitors, benzothiadiazides and other diuretics, initial encounter: Secondary | ICD-10-CM | POA: Diagnosis not present

## 2020-05-19 DIAGNOSIS — Z951 Presence of aortocoronary bypass graft: Secondary | ICD-10-CM | POA: Diagnosis not present

## 2020-05-19 DIAGNOSIS — R279 Unspecified lack of coordination: Secondary | ICD-10-CM | POA: Diagnosis not present

## 2020-05-19 DIAGNOSIS — R6889 Other general symptoms and signs: Secondary | ICD-10-CM | POA: Diagnosis not present

## 2020-05-19 DIAGNOSIS — I130 Hypertensive heart and chronic kidney disease with heart failure and stage 1 through stage 4 chronic kidney disease, or unspecified chronic kidney disease: Secondary | ICD-10-CM | POA: Diagnosis not present

## 2020-05-19 DIAGNOSIS — I491 Atrial premature depolarization: Secondary | ICD-10-CM | POA: Diagnosis not present

## 2020-05-19 DIAGNOSIS — R6520 Severe sepsis without septic shock: Secondary | ICD-10-CM | POA: Diagnosis not present

## 2020-05-19 DIAGNOSIS — I10 Essential (primary) hypertension: Secondary | ICD-10-CM | POA: Diagnosis not present

## 2020-05-19 DIAGNOSIS — M109 Gout, unspecified: Secondary | ICD-10-CM | POA: Diagnosis not present

## 2020-05-19 DIAGNOSIS — I5033 Acute on chronic diastolic (congestive) heart failure: Secondary | ICD-10-CM | POA: Diagnosis not present

## 2020-05-19 DIAGNOSIS — R0603 Acute respiratory distress: Secondary | ICD-10-CM | POA: Diagnosis not present

## 2020-05-19 DIAGNOSIS — A419 Sepsis, unspecified organism: Secondary | ICD-10-CM | POA: Diagnosis not present

## 2020-05-19 DIAGNOSIS — I4510 Unspecified right bundle-branch block: Secondary | ICD-10-CM | POA: Diagnosis not present

## 2020-05-19 DIAGNOSIS — G8929 Other chronic pain: Secondary | ICD-10-CM | POA: Diagnosis not present

## 2020-05-19 DIAGNOSIS — I5030 Unspecified diastolic (congestive) heart failure: Secondary | ICD-10-CM | POA: Diagnosis not present

## 2020-05-19 DIAGNOSIS — I129 Hypertensive chronic kidney disease with stage 1 through stage 4 chronic kidney disease, or unspecified chronic kidney disease: Secondary | ICD-10-CM | POA: Diagnosis not present

## 2020-05-19 DIAGNOSIS — R0902 Hypoxemia: Secondary | ICD-10-CM | POA: Diagnosis not present

## 2020-05-19 DIAGNOSIS — Z743 Need for continuous supervision: Secondary | ICD-10-CM | POA: Diagnosis not present

## 2020-05-20 DIAGNOSIS — J90 Pleural effusion, not elsewhere classified: Secondary | ICD-10-CM | POA: Diagnosis not present

## 2020-05-20 DIAGNOSIS — J9621 Acute and chronic respiratory failure with hypoxia: Secondary | ICD-10-CM | POA: Diagnosis not present

## 2020-05-20 DIAGNOSIS — N183 Chronic kidney disease, stage 3 unspecified: Secondary | ICD-10-CM | POA: Diagnosis not present

## 2020-05-20 DIAGNOSIS — N189 Chronic kidney disease, unspecified: Secondary | ICD-10-CM | POA: Diagnosis not present

## 2020-05-20 DIAGNOSIS — N179 Acute kidney failure, unspecified: Secondary | ICD-10-CM | POA: Diagnosis not present

## 2020-05-20 DIAGNOSIS — R918 Other nonspecific abnormal finding of lung field: Secondary | ICD-10-CM | POA: Diagnosis not present

## 2020-05-20 DIAGNOSIS — J9622 Acute and chronic respiratory failure with hypercapnia: Secondary | ICD-10-CM | POA: Diagnosis not present

## 2020-05-20 DIAGNOSIS — I4510 Unspecified right bundle-branch block: Secondary | ICD-10-CM | POA: Diagnosis not present

## 2020-05-20 DIAGNOSIS — Z4659 Encounter for fitting and adjustment of other gastrointestinal appliance and device: Secondary | ICD-10-CM | POA: Diagnosis not present

## 2020-05-20 DIAGNOSIS — I491 Atrial premature depolarization: Secondary | ICD-10-CM | POA: Diagnosis not present

## 2020-05-20 DIAGNOSIS — Z905 Acquired absence of kidney: Secondary | ICD-10-CM | POA: Diagnosis not present

## 2020-05-21 DIAGNOSIS — I5030 Unspecified diastolic (congestive) heart failure: Secondary | ICD-10-CM | POA: Diagnosis not present

## 2020-05-21 DIAGNOSIS — J9621 Acute and chronic respiratory failure with hypoxia: Secondary | ICD-10-CM | POA: Diagnosis not present

## 2020-05-21 DIAGNOSIS — G4733 Obstructive sleep apnea (adult) (pediatric): Secondary | ICD-10-CM | POA: Diagnosis not present

## 2020-05-21 DIAGNOSIS — J9622 Acute and chronic respiratory failure with hypercapnia: Secondary | ICD-10-CM | POA: Diagnosis not present

## 2020-05-21 DIAGNOSIS — A419 Sepsis, unspecified organism: Secondary | ICD-10-CM | POA: Diagnosis not present

## 2020-05-22 DIAGNOSIS — J9622 Acute and chronic respiratory failure with hypercapnia: Secondary | ICD-10-CM | POA: Diagnosis not present

## 2020-05-22 DIAGNOSIS — J9601 Acute respiratory failure with hypoxia: Secondary | ICD-10-CM | POA: Diagnosis not present

## 2020-05-22 DIAGNOSIS — A419 Sepsis, unspecified organism: Secondary | ICD-10-CM | POA: Diagnosis not present

## 2020-05-22 DIAGNOSIS — I5030 Unspecified diastolic (congestive) heart failure: Secondary | ICD-10-CM | POA: Diagnosis not present

## 2020-05-23 DIAGNOSIS — I2510 Atherosclerotic heart disease of native coronary artery without angina pectoris: Secondary | ICD-10-CM | POA: Diagnosis not present

## 2020-05-23 DIAGNOSIS — A419 Sepsis, unspecified organism: Secondary | ICD-10-CM | POA: Diagnosis not present

## 2020-05-23 DIAGNOSIS — N189 Chronic kidney disease, unspecified: Secondary | ICD-10-CM | POA: Diagnosis not present

## 2020-05-23 DIAGNOSIS — I5031 Acute diastolic (congestive) heart failure: Secondary | ICD-10-CM | POA: Diagnosis not present

## 2020-05-23 DIAGNOSIS — M62838 Other muscle spasm: Secondary | ICD-10-CM | POA: Diagnosis not present

## 2020-05-23 DIAGNOSIS — J9621 Acute and chronic respiratory failure with hypoxia: Secondary | ICD-10-CM | POA: Diagnosis not present

## 2020-05-23 DIAGNOSIS — N179 Acute kidney failure, unspecified: Secondary | ICD-10-CM | POA: Diagnosis not present

## 2020-05-23 DIAGNOSIS — I130 Hypertensive heart and chronic kidney disease with heart failure and stage 1 through stage 4 chronic kidney disease, or unspecified chronic kidney disease: Secondary | ICD-10-CM | POA: Diagnosis not present

## 2020-05-23 DIAGNOSIS — J9622 Acute and chronic respiratory failure with hypercapnia: Secondary | ICD-10-CM | POA: Diagnosis not present

## 2020-05-24 DIAGNOSIS — J9621 Acute and chronic respiratory failure with hypoxia: Secondary | ICD-10-CM | POA: Diagnosis not present

## 2020-05-24 DIAGNOSIS — N179 Acute kidney failure, unspecified: Secondary | ICD-10-CM | POA: Diagnosis not present

## 2020-05-24 DIAGNOSIS — J9622 Acute and chronic respiratory failure with hypercapnia: Secondary | ICD-10-CM | POA: Diagnosis not present

## 2020-05-24 DIAGNOSIS — I2510 Atherosclerotic heart disease of native coronary artery without angina pectoris: Secondary | ICD-10-CM | POA: Diagnosis not present

## 2020-05-24 DIAGNOSIS — I10 Essential (primary) hypertension: Secondary | ICD-10-CM | POA: Diagnosis not present

## 2020-05-24 DIAGNOSIS — M62838 Other muscle spasm: Secondary | ICD-10-CM | POA: Diagnosis not present

## 2020-05-24 DIAGNOSIS — I5031 Acute diastolic (congestive) heart failure: Secondary | ICD-10-CM | POA: Diagnosis not present

## 2020-05-24 DIAGNOSIS — A419 Sepsis, unspecified organism: Secondary | ICD-10-CM | POA: Diagnosis not present

## 2020-05-24 DIAGNOSIS — I130 Hypertensive heart and chronic kidney disease with heart failure and stage 1 through stage 4 chronic kidney disease, or unspecified chronic kidney disease: Secondary | ICD-10-CM | POA: Diagnosis not present

## 2020-05-24 DIAGNOSIS — N189 Chronic kidney disease, unspecified: Secondary | ICD-10-CM | POA: Diagnosis not present

## 2020-05-24 DIAGNOSIS — Z85528 Personal history of other malignant neoplasm of kidney: Secondary | ICD-10-CM | POA: Diagnosis not present

## 2020-05-25 DIAGNOSIS — J9621 Acute and chronic respiratory failure with hypoxia: Secondary | ICD-10-CM | POA: Diagnosis not present

## 2020-05-25 DIAGNOSIS — A419 Sepsis, unspecified organism: Secondary | ICD-10-CM | POA: Diagnosis not present

## 2020-05-25 DIAGNOSIS — I130 Hypertensive heart and chronic kidney disease with heart failure and stage 1 through stage 4 chronic kidney disease, or unspecified chronic kidney disease: Secondary | ICD-10-CM | POA: Diagnosis not present

## 2020-05-25 DIAGNOSIS — J9622 Acute and chronic respiratory failure with hypercapnia: Secondary | ICD-10-CM | POA: Diagnosis not present

## 2020-05-25 DIAGNOSIS — N179 Acute kidney failure, unspecified: Secondary | ICD-10-CM | POA: Diagnosis not present

## 2020-05-25 DIAGNOSIS — I2510 Atherosclerotic heart disease of native coronary artery without angina pectoris: Secondary | ICD-10-CM | POA: Diagnosis not present

## 2020-05-25 DIAGNOSIS — N189 Chronic kidney disease, unspecified: Secondary | ICD-10-CM | POA: Diagnosis not present

## 2020-05-25 DIAGNOSIS — I5031 Acute diastolic (congestive) heart failure: Secondary | ICD-10-CM | POA: Diagnosis not present

## 2020-05-25 DIAGNOSIS — M62838 Other muscle spasm: Secondary | ICD-10-CM | POA: Diagnosis not present

## 2020-05-26 DIAGNOSIS — N189 Chronic kidney disease, unspecified: Secondary | ICD-10-CM | POA: Diagnosis not present

## 2020-05-26 DIAGNOSIS — J9622 Acute and chronic respiratory failure with hypercapnia: Secondary | ICD-10-CM | POA: Diagnosis not present

## 2020-05-26 DIAGNOSIS — J9621 Acute and chronic respiratory failure with hypoxia: Secondary | ICD-10-CM | POA: Diagnosis not present

## 2020-05-26 DIAGNOSIS — I509 Heart failure, unspecified: Secondary | ICD-10-CM | POA: Diagnosis not present

## 2020-05-26 DIAGNOSIS — G4733 Obstructive sleep apnea (adult) (pediatric): Secondary | ICD-10-CM | POA: Diagnosis not present

## 2020-05-26 DIAGNOSIS — N179 Acute kidney failure, unspecified: Secondary | ICD-10-CM | POA: Diagnosis not present

## 2020-05-26 DIAGNOSIS — J9811 Atelectasis: Secondary | ICD-10-CM | POA: Diagnosis not present

## 2020-05-26 DIAGNOSIS — N184 Chronic kidney disease, stage 4 (severe): Secondary | ICD-10-CM | POA: Diagnosis not present

## 2020-05-26 DIAGNOSIS — I130 Hypertensive heart and chronic kidney disease with heart failure and stage 1 through stage 4 chronic kidney disease, or unspecified chronic kidney disease: Secondary | ICD-10-CM | POA: Diagnosis not present

## 2020-05-26 DIAGNOSIS — I5030 Unspecified diastolic (congestive) heart failure: Secondary | ICD-10-CM | POA: Diagnosis not present

## 2020-05-26 DIAGNOSIS — M62838 Other muscle spasm: Secondary | ICD-10-CM | POA: Diagnosis not present

## 2020-05-26 DIAGNOSIS — E7849 Other hyperlipidemia: Secondary | ICD-10-CM | POA: Diagnosis not present

## 2020-05-26 DIAGNOSIS — I2510 Atherosclerotic heart disease of native coronary artery without angina pectoris: Secondary | ICD-10-CM | POA: Diagnosis not present

## 2020-05-27 DIAGNOSIS — J9811 Atelectasis: Secondary | ICD-10-CM | POA: Diagnosis not present

## 2020-05-27 DIAGNOSIS — N179 Acute kidney failure, unspecified: Secondary | ICD-10-CM | POA: Diagnosis not present

## 2020-05-27 DIAGNOSIS — G4733 Obstructive sleep apnea (adult) (pediatric): Secondary | ICD-10-CM | POA: Diagnosis not present

## 2020-05-27 DIAGNOSIS — N183 Chronic kidney disease, stage 3 unspecified: Secondary | ICD-10-CM | POA: Diagnosis not present

## 2020-05-27 DIAGNOSIS — I130 Hypertensive heart and chronic kidney disease with heart failure and stage 1 through stage 4 chronic kidney disease, or unspecified chronic kidney disease: Secondary | ICD-10-CM | POA: Diagnosis not present

## 2020-05-27 DIAGNOSIS — J398 Other specified diseases of upper respiratory tract: Secondary | ICD-10-CM | POA: Diagnosis not present

## 2020-05-27 DIAGNOSIS — J9622 Acute and chronic respiratory failure with hypercapnia: Secondary | ICD-10-CM | POA: Diagnosis not present

## 2020-05-27 DIAGNOSIS — I2510 Atherosclerotic heart disease of native coronary artery without angina pectoris: Secondary | ICD-10-CM | POA: Diagnosis not present

## 2020-05-27 DIAGNOSIS — M109 Gout, unspecified: Secondary | ICD-10-CM | POA: Diagnosis not present

## 2020-05-27 DIAGNOSIS — J9621 Acute and chronic respiratory failure with hypoxia: Secondary | ICD-10-CM | POA: Diagnosis not present

## 2020-05-27 DIAGNOSIS — I5033 Acute on chronic diastolic (congestive) heart failure: Secondary | ICD-10-CM | POA: Diagnosis not present

## 2020-06-03 DIAGNOSIS — E538 Deficiency of other specified B group vitamins: Secondary | ICD-10-CM | POA: Diagnosis not present

## 2020-06-03 DIAGNOSIS — I2510 Atherosclerotic heart disease of native coronary artery without angina pectoris: Secondary | ICD-10-CM | POA: Diagnosis not present

## 2020-06-03 DIAGNOSIS — Z951 Presence of aortocoronary bypass graft: Secondary | ICD-10-CM | POA: Diagnosis not present

## 2020-06-03 DIAGNOSIS — G4733 Obstructive sleep apnea (adult) (pediatric): Secondary | ICD-10-CM | POA: Diagnosis not present

## 2020-06-03 DIAGNOSIS — M109 Gout, unspecified: Secondary | ICD-10-CM | POA: Diagnosis not present

## 2020-06-03 DIAGNOSIS — I110 Hypertensive heart disease with heart failure: Secondary | ICD-10-CM | POA: Diagnosis not present

## 2020-06-03 DIAGNOSIS — Z9981 Dependence on supplemental oxygen: Secondary | ICD-10-CM | POA: Diagnosis not present

## 2020-06-03 DIAGNOSIS — I5033 Acute on chronic diastolic (congestive) heart failure: Secondary | ICD-10-CM | POA: Diagnosis not present

## 2020-06-03 DIAGNOSIS — J9621 Acute and chronic respiratory failure with hypoxia: Secondary | ICD-10-CM | POA: Diagnosis not present

## 2020-06-03 DIAGNOSIS — J9622 Acute and chronic respiratory failure with hypercapnia: Secondary | ICD-10-CM | POA: Diagnosis not present

## 2020-06-03 DIAGNOSIS — Z87891 Personal history of nicotine dependence: Secondary | ICD-10-CM | POA: Diagnosis not present

## 2020-06-03 DIAGNOSIS — Z85828 Personal history of other malignant neoplasm of skin: Secondary | ICD-10-CM | POA: Diagnosis not present

## 2020-06-04 DIAGNOSIS — I5033 Acute on chronic diastolic (congestive) heart failure: Secondary | ICD-10-CM | POA: Diagnosis not present

## 2020-06-04 DIAGNOSIS — Z9981 Dependence on supplemental oxygen: Secondary | ICD-10-CM | POA: Diagnosis not present

## 2020-06-04 DIAGNOSIS — Z7401 Bed confinement status: Secondary | ICD-10-CM | POA: Diagnosis not present

## 2020-06-04 DIAGNOSIS — J9612 Chronic respiratory failure with hypercapnia: Secondary | ICD-10-CM | POA: Diagnosis not present

## 2020-06-04 DIAGNOSIS — J9611 Chronic respiratory failure with hypoxia: Secondary | ICD-10-CM | POA: Diagnosis not present

## 2020-06-08 DIAGNOSIS — M109 Gout, unspecified: Secondary | ICD-10-CM | POA: Diagnosis not present

## 2020-06-08 DIAGNOSIS — E538 Deficiency of other specified B group vitamins: Secondary | ICD-10-CM | POA: Diagnosis not present

## 2020-06-08 DIAGNOSIS — I2510 Atherosclerotic heart disease of native coronary artery without angina pectoris: Secondary | ICD-10-CM | POA: Diagnosis not present

## 2020-06-08 DIAGNOSIS — I110 Hypertensive heart disease with heart failure: Secondary | ICD-10-CM | POA: Diagnosis not present

## 2020-06-08 DIAGNOSIS — J9622 Acute and chronic respiratory failure with hypercapnia: Secondary | ICD-10-CM | POA: Diagnosis not present

## 2020-06-08 DIAGNOSIS — J9621 Acute and chronic respiratory failure with hypoxia: Secondary | ICD-10-CM | POA: Diagnosis not present

## 2020-06-08 DIAGNOSIS — I5033 Acute on chronic diastolic (congestive) heart failure: Secondary | ICD-10-CM | POA: Diagnosis not present

## 2020-06-08 DIAGNOSIS — Z9981 Dependence on supplemental oxygen: Secondary | ICD-10-CM | POA: Diagnosis not present

## 2020-06-08 DIAGNOSIS — G4733 Obstructive sleep apnea (adult) (pediatric): Secondary | ICD-10-CM | POA: Diagnosis not present

## 2020-06-08 DIAGNOSIS — J9602 Acute respiratory failure with hypercapnia: Secondary | ICD-10-CM | POA: Diagnosis not present

## 2020-06-08 DIAGNOSIS — Z85828 Personal history of other malignant neoplasm of skin: Secondary | ICD-10-CM | POA: Diagnosis not present

## 2020-06-08 DIAGNOSIS — Z87891 Personal history of nicotine dependence: Secondary | ICD-10-CM | POA: Diagnosis not present

## 2020-06-08 DIAGNOSIS — Z951 Presence of aortocoronary bypass graft: Secondary | ICD-10-CM | POA: Diagnosis not present

## 2020-06-12 DIAGNOSIS — J9610 Chronic respiratory failure, unspecified whether with hypoxia or hypercapnia: Secondary | ICD-10-CM | POA: Diagnosis not present

## 2020-06-12 DIAGNOSIS — C649 Malignant neoplasm of unspecified kidney, except renal pelvis: Secondary | ICD-10-CM | POA: Diagnosis not present

## 2020-06-14 DIAGNOSIS — I5033 Acute on chronic diastolic (congestive) heart failure: Secondary | ICD-10-CM | POA: Diagnosis not present

## 2020-06-14 DIAGNOSIS — I110 Hypertensive heart disease with heart failure: Secondary | ICD-10-CM | POA: Diagnosis not present

## 2020-06-14 DIAGNOSIS — I2510 Atherosclerotic heart disease of native coronary artery without angina pectoris: Secondary | ICD-10-CM | POA: Diagnosis not present

## 2020-06-15 DIAGNOSIS — I5033 Acute on chronic diastolic (congestive) heart failure: Secondary | ICD-10-CM | POA: Diagnosis not present

## 2020-06-15 DIAGNOSIS — J9622 Acute and chronic respiratory failure with hypercapnia: Secondary | ICD-10-CM | POA: Diagnosis not present

## 2020-06-15 DIAGNOSIS — I2510 Atherosclerotic heart disease of native coronary artery without angina pectoris: Secondary | ICD-10-CM | POA: Diagnosis not present

## 2020-06-15 DIAGNOSIS — G4733 Obstructive sleep apnea (adult) (pediatric): Secondary | ICD-10-CM | POA: Diagnosis not present

## 2020-06-15 DIAGNOSIS — M109 Gout, unspecified: Secondary | ICD-10-CM | POA: Diagnosis not present

## 2020-06-15 DIAGNOSIS — J9621 Acute and chronic respiratory failure with hypoxia: Secondary | ICD-10-CM | POA: Diagnosis not present

## 2020-06-15 DIAGNOSIS — Z951 Presence of aortocoronary bypass graft: Secondary | ICD-10-CM | POA: Diagnosis not present

## 2020-06-15 DIAGNOSIS — Z9981 Dependence on supplemental oxygen: Secondary | ICD-10-CM | POA: Diagnosis not present

## 2020-06-15 DIAGNOSIS — E538 Deficiency of other specified B group vitamins: Secondary | ICD-10-CM | POA: Diagnosis not present

## 2020-06-15 DIAGNOSIS — Z85828 Personal history of other malignant neoplasm of skin: Secondary | ICD-10-CM | POA: Diagnosis not present

## 2020-06-15 DIAGNOSIS — Z87891 Personal history of nicotine dependence: Secondary | ICD-10-CM | POA: Diagnosis not present

## 2020-06-15 DIAGNOSIS — I110 Hypertensive heart disease with heart failure: Secondary | ICD-10-CM | POA: Diagnosis not present

## 2020-06-16 DIAGNOSIS — M109 Gout, unspecified: Secondary | ICD-10-CM | POA: Diagnosis not present

## 2020-06-16 DIAGNOSIS — E538 Deficiency of other specified B group vitamins: Secondary | ICD-10-CM | POA: Diagnosis not present

## 2020-06-16 DIAGNOSIS — Z85828 Personal history of other malignant neoplasm of skin: Secondary | ICD-10-CM | POA: Diagnosis not present

## 2020-06-16 DIAGNOSIS — Z951 Presence of aortocoronary bypass graft: Secondary | ICD-10-CM | POA: Diagnosis not present

## 2020-06-16 DIAGNOSIS — G4733 Obstructive sleep apnea (adult) (pediatric): Secondary | ICD-10-CM | POA: Diagnosis not present

## 2020-06-16 DIAGNOSIS — I2510 Atherosclerotic heart disease of native coronary artery without angina pectoris: Secondary | ICD-10-CM | POA: Diagnosis not present

## 2020-06-16 DIAGNOSIS — Z87891 Personal history of nicotine dependence: Secondary | ICD-10-CM | POA: Diagnosis not present

## 2020-06-16 DIAGNOSIS — I5033 Acute on chronic diastolic (congestive) heart failure: Secondary | ICD-10-CM | POA: Diagnosis not present

## 2020-06-16 DIAGNOSIS — I110 Hypertensive heart disease with heart failure: Secondary | ICD-10-CM | POA: Diagnosis not present

## 2020-06-16 DIAGNOSIS — J9621 Acute and chronic respiratory failure with hypoxia: Secondary | ICD-10-CM | POA: Diagnosis not present

## 2020-06-16 DIAGNOSIS — Z9981 Dependence on supplemental oxygen: Secondary | ICD-10-CM | POA: Diagnosis not present

## 2020-06-16 DIAGNOSIS — J9622 Acute and chronic respiratory failure with hypercapnia: Secondary | ICD-10-CM | POA: Diagnosis not present

## 2020-06-27 DEATH — deceased

## 2022-07-31 ENCOUNTER — Telehealth (HOSPITAL_COMMUNITY): Payer: Self-pay | Admitting: *Deleted

## 2022-07-31 NOTE — Telephone Encounter (Signed)
Opened in error
